# Patient Record
Sex: Female | Born: 1950 | Race: Black or African American | Hispanic: No | Marital: Single | Smoking: Never smoker
Health system: Southern US, Community
[De-identification: ages and names within clinical notes are randomized; demographics above are authoritative.]

## PROBLEM LIST (undated history)

## (undated) DIAGNOSIS — E785 Hyperlipidemia, unspecified: Secondary | ICD-10-CM

## (undated) DIAGNOSIS — I10 Essential (primary) hypertension: Secondary | ICD-10-CM

## (undated) DIAGNOSIS — E119 Type 2 diabetes mellitus without complications: Secondary | ICD-10-CM

## (undated) DIAGNOSIS — C801 Malignant (primary) neoplasm, unspecified: Secondary | ICD-10-CM

## (undated) DIAGNOSIS — D649 Anemia, unspecified: Secondary | ICD-10-CM

## (undated) DIAGNOSIS — R0602 Shortness of breath: Secondary | ICD-10-CM

## (undated) DIAGNOSIS — F419 Anxiety disorder, unspecified: Secondary | ICD-10-CM

## (undated) DIAGNOSIS — C189 Malignant neoplasm of colon, unspecified: Secondary | ICD-10-CM

## (undated) DIAGNOSIS — K219 Gastro-esophageal reflux disease without esophagitis: Secondary | ICD-10-CM

## (undated) HISTORY — DX: Essential (primary) hypertension: I10

## (undated) HISTORY — DX: Anemia, unspecified: D64.9

## (undated) HISTORY — DX: Malignant neoplasm of colon, unspecified: C18.9

## (undated) HISTORY — PX: CHOLECYSTECTOMY: SHX55

## (undated) HISTORY — PX: ABDOMINAL SURGERY: SHX537

## (undated) HISTORY — PX: HYSTERECTOMY: SHX81

---

## 1985-09-04 HISTORY — PX: CHOLECYSTECTOMY: SHX55

## 2003-09-05 DIAGNOSIS — C189 Malignant neoplasm of colon, unspecified: Secondary | ICD-10-CM

## 2003-09-05 HISTORY — PX: COLECTOMY: SHX59

## 2003-09-05 HISTORY — DX: Malignant neoplasm of colon, unspecified: C18.9

## 2010-11-17 ENCOUNTER — Emergency Department (HOSPITAL_COMMUNITY): Payer: Medicaid Other

## 2010-11-17 ENCOUNTER — Emergency Department (HOSPITAL_COMMUNITY)
Admission: EM | Admit: 2010-11-17 | Discharge: 2010-11-17 | Disposition: A | Discharge status: Home / Self Care | Payer: Medicaid Other | Attending: Emergency Medicine | Admitting: Emergency Medicine

## 2010-11-17 DIAGNOSIS — R109 Unspecified abdominal pain: Secondary | ICD-10-CM | POA: Insufficient documentation

## 2010-11-17 DIAGNOSIS — N839 Noninflammatory disorder of ovary, fallopian tube and broad ligament, unspecified: Secondary | ICD-10-CM | POA: Insufficient documentation

## 2010-11-17 LAB — URINALYSIS, ROUTINE W REFLEX MICROSCOPIC
Glucose, UA: NEGATIVE mg/dL
Leukocytes, UA: NEGATIVE
Protein, ur: NEGATIVE mg/dL
Specific Gravity, Urine: >1.03 — ABNORMAL HIGH (ref 1.005–1.030)
Urobilinogen, UA: 0.2 mg/dL (ref 0.0–1.0)

## 2010-11-17 LAB — DIFFERENTIAL
Basophils Absolute: 0 10*3/uL (ref 0.0–0.1)
Basophils Relative: 0 % (ref 0–1)
Eosinophils Absolute: 0 10*3/uL (ref 0.0–0.7)
Eosinophils Relative: 1 % (ref 0–5)
Monocytes Absolute: 0.7 10*3/uL (ref 0.1–1.0)

## 2010-11-17 LAB — CBC
MCHC: 33 g/dL (ref 30.0–36.0)
Platelets: 206 10*3/uL (ref 150–400)
RDW: 12.8 % (ref 11.5–15.5)
WBC: 4.7 10*3/uL (ref 4.0–10.5)

## 2010-11-17 LAB — URINE MICROSCOPIC-ADD ON

## 2010-11-17 LAB — COMPREHENSIVE METABOLIC PANEL
ALT: 16 U/L (ref 0–35)
Albumin: 3.4 g/dL — ABNORMAL LOW (ref 3.5–5.2)
Alkaline Phosphatase: 49 U/L (ref 39–117)
Calcium: 9.1 mg/dL (ref 8.4–10.5)
Potassium: 3.4 mEq/L — ABNORMAL LOW (ref 3.5–5.1)
Sodium: 138 mEq/L (ref 135–145)
Total Protein: 6.7 g/dL (ref 6.0–8.3)

## 2010-11-17 LAB — PROTIME-INR
INR: 1.01 (ref 0.00–1.49)
Prothrombin Time: 13.5 seconds (ref 11.6–15.2)

## 2010-11-17 MED ORDER — IOHEXOL 300 MG/ML  SOLN
100.0000 mL | Freq: Once | INTRAMUSCULAR | Status: AC | PRN
  Administered 2010-11-17: 100 mL via INTRAVENOUS

## 2010-11-18 ENCOUNTER — Emergency Department (HOSPITAL_COMMUNITY): Payer: Medicaid Other

## 2010-11-18 LAB — CEA: CEA: 11.4 ng/mL — ABNORMAL HIGH (ref 0.0–5.0)

## 2010-11-18 LAB — URINE CULTURE
Culture  Setup Time: 201203160143
Culture: NO GROWTH

## 2010-11-21 ENCOUNTER — Other Ambulatory Visit: Payer: Self-pay | Admitting: Obstetrics and Gynecology

## 2010-11-21 ENCOUNTER — Other Ambulatory Visit (HOSPITAL_COMMUNITY)
Admission: RE | Admit: 2010-11-21 | Discharge: 2010-11-21 | Disposition: A | Discharge status: Home / Self Care | Payer: Medicaid Other | Source: Ambulatory Visit | Attending: Obstetrics and Gynecology | Admitting: Obstetrics and Gynecology

## 2010-11-21 DIAGNOSIS — Z01419 Encounter for gynecological examination (general) (routine) without abnormal findings: Secondary | ICD-10-CM | POA: Insufficient documentation

## 2010-12-01 ENCOUNTER — Ambulatory Visit: Payer: Medicaid Other | Attending: Gynecologic Oncology | Admitting: Gynecologic Oncology

## 2010-12-01 ENCOUNTER — Other Ambulatory Visit: Payer: Self-pay | Admitting: Gynecologic Oncology

## 2010-12-01 DIAGNOSIS — Z9089 Acquired absence of other organs: Secondary | ICD-10-CM | POA: Insufficient documentation

## 2010-12-01 DIAGNOSIS — R6881 Early satiety: Secondary | ICD-10-CM | POA: Insufficient documentation

## 2010-12-01 DIAGNOSIS — N9489 Other specified conditions associated with female genital organs and menstrual cycle: Secondary | ICD-10-CM | POA: Insufficient documentation

## 2010-12-01 DIAGNOSIS — R110 Nausea: Secondary | ICD-10-CM | POA: Insufficient documentation

## 2010-12-01 DIAGNOSIS — E785 Hyperlipidemia, unspecified: Secondary | ICD-10-CM | POA: Insufficient documentation

## 2010-12-01 DIAGNOSIS — Z85038 Personal history of other malignant neoplasm of large intestine: Secondary | ICD-10-CM | POA: Insufficient documentation

## 2010-12-01 DIAGNOSIS — I10 Essential (primary) hypertension: Secondary | ICD-10-CM | POA: Insufficient documentation

## 2010-12-01 DIAGNOSIS — E119 Type 2 diabetes mellitus without complications: Secondary | ICD-10-CM | POA: Insufficient documentation

## 2010-12-02 ENCOUNTER — Other Ambulatory Visit: Payer: Self-pay | Admitting: Gynecology

## 2010-12-02 ENCOUNTER — Encounter (HOSPITAL_COMMUNITY): Payer: Medicaid Other | Attending: Gynecology

## 2010-12-02 DIAGNOSIS — Z01812 Encounter for preprocedural laboratory examination: Secondary | ICD-10-CM | POA: Insufficient documentation

## 2010-12-02 LAB — CBC
Platelets: 310 10*3/uL (ref 150–400)
RBC: 4.36 MIL/uL (ref 3.87–5.11)
RDW: 13.1 % (ref 11.5–15.5)
WBC: 4.9 10*3/uL (ref 4.0–10.5)

## 2010-12-02 LAB — COMPREHENSIVE METABOLIC PANEL
ALT: 16 U/L (ref 0–35)
Albumin: 3.7 g/dL (ref 3.5–5.2)
Calcium: 9.2 mg/dL (ref 8.4–10.5)
Glucose, Bld: 143 mg/dL — ABNORMAL HIGH (ref 70–99)
Sodium: 137 mEq/L (ref 135–145)
Total Protein: 7.1 g/dL (ref 6.0–8.3)

## 2010-12-02 LAB — DIFFERENTIAL
Basophils Absolute: 0 10*3/uL (ref 0.0–0.1)
Basophils Absolute: 0 10*3/uL (ref 0.0–0.1)
Basophils Relative: 0 % (ref 0–1)
Eosinophils Absolute: 0.1 10*3/uL (ref 0.0–0.7)
Eosinophils Relative: 2 % (ref 0–5)
Eosinophils Relative: 2 % (ref 0–5)
Lymphocytes Relative: 30 % (ref 12–46)
Lymphs Abs: 1.5 10*3/uL (ref 0.7–4.0)
Neutro Abs: 3 10*3/uL (ref 1.7–7.7)
Neutrophils Relative %: 61 % (ref 43–77)
Neutrophils Relative %: 61 % (ref 43–77)

## 2010-12-02 LAB — SURGICAL PCR SCREEN
MRSA, PCR: NEGATIVE
Staphylococcus aureus: NEGATIVE

## 2010-12-02 LAB — ABO/RH: ABO/RH(D): A POS

## 2010-12-02 NOTE — Consult Note (Signed)
NAME:  Yolanda Friedman, Yolanda Friedman NO.:  [id number]  MEDICAL RECORD NO.:  [id number]           PATIENT TYPE:  LOCATION:                                 FACILITY:  PHYSICIAN:  Laurette Schimke, MD     DATE OF BIRTH:  1951-04-09  DATE OF CONSULTATION: DATE OF DISCHARGE:                                CONSULTATION   REASON FOR VISIT:  Visit was requested for evaluation of a pelvic mass.  HISTORY OF PRESENT ILLNESS:  This is a 60 year old gravida 1, para 1, last normal menstrual period at approximately 60 years of age.  Yolanda Friedman has noticed an increase in abdominal girth of 2 months' duration. At this time, she is currently unable to fit into many of her pants. She reports some nausea, no vomiting.  There is early satiety, although she states that she has a good appetite.  There is no vaginal or rectal bleeding or lower extremity edema.  She denies any weight loss.  She reports normal caliber of stool.  No rectal bleeding.  Her history is notable for colon cancer, stage unknown to the patient, diagnosed in May 2005, treated with surgical resection and chemotherapy.  She states that in 2010 prior to her departure from [place], [place].  She underwent a colonoscopy, which is within normal limits.  She is unclear of her CEA at the time of presentation.  The colon cancer was identified as part of a workup for anemia and shortness of breath.  She was evaluated by Dr. Emelda Fear and a CT of the abdomen and pelvis demonstrates the presence of a 24.1 x 16.2 x 17.4 solid right adnexal mass and left adnexal mass measuring 15.6 x 14.0 x 13.7 cm was noted.  Endometrium was noted to be thickened, measuring 2.2 cm.  Uterus was enlarged to 12.4 cm.  An ultrasound confirmed these findings.  A CEA was obtained, returned with a value of 11.4.  CA-125 returned a value of 58.2.  PAST MEDICAL HISTORY: 1. Hypertension for 4 years. 2. Diabetes mellitus, diet controlled, diagnosed 4 years  ago. 3. Hyperlipidemia, diagnosed 4 years ago. 4. Colon cancer, diagnosed in 2005.  PAST SURGICAL HISTORY:  Colon resection in 2005, cholecystectomy greater than 20 years ago.  PAST GYN HISTORY:  Menarche occurred at age of 81 with regular menses. Last Pap in 2012 within normal limits per the patient.  FAMILY HISTORY:  Without any reports of uterine, ovarian, breasts, colon, kidney malignancies.  SOCIAL HISTORY:  She denies tobacco use.  She reports use of approximately 24 ounces of beer per day and occasional marijuana use. She moved from [place], [place] in 2010.  She is unemployed.  Her son and adopted son are very present and involved in her life.  REVIEW OF SYSTEMS:  Ten-point review of systems is only noteworthy as above.  PHYSICAL EXAMINATION:  GENERAL:  A well-developed pleasant female in no acute distress. VITAL SIGNS:  Height 5 feet, weight 210 pounds, BMI 41.  Blood pressure 140/80, pulse of 80, respiratory rate of 20.  CHEST:  Clear to auscultation. HEART:  Regular rate and rhythm. ABDOMEN:  Distended, mass palpable above the umbilicus.  No CVA tenderness. LYMPH NODE SURVEY:  No cervical, supraclavicular, or inguinal adenopathy. PELVIC:  Normal external genitalia, Bartholin, urethral, and Skene. Cervix approximately 2 cm.  Endometrial biopsy performed with return of bloody fluid.  Uterus sounded to approximately 11 cm.  No nodularity within the cul-de-sac.  Pelvic masses appreciated. RECTAL:  Good anal and sphincter tone without any masses.  No palpable staple line.  IMPRESSION:  This is a lovely 60 year old with large bilateral adnexal solid masses and elevated CEA and CA-125 and a history of colon cancer. The patient and her sons were counseled that this may represent an ovarian neoplasm versus possible metastatic colon cancer.  An endometrial biopsy was performed to further elucidate the cause of the thickened endometrial stripe.  The pathology will be  reviewed prior to surgical intervention.  The proposed surgical intervention as recommended and approved by the patient was that of exploratory laparotomy, total abdominal hysterectomy, bilateral salpingo-oophorectomy.  Staging or additional procedures will be performed as directed by the intraoperative frozen section.  Yolanda Friedman is aware that her surgeon will be Dr. Katheren Shams- Sharol Given.  Risks and benefits of the procedure were discussed with her. All of her and her son's questions were answered to their satisfaction.     Laurette Schimke, MD     WB/MEDQ  D:  12/01/2010  T:  12/02/2010  Job:  [id number]  cc:   Dr. Clearence Cheek, R.N. [address] [place], [place] [address]  Electronically Signed by Laurette Schimke MD on 12/02/2010 10:09:34 AM

## 2010-12-06 ENCOUNTER — Other Ambulatory Visit: Payer: Self-pay | Admitting: Gynecology

## 2010-12-06 ENCOUNTER — Inpatient Hospital Stay (HOSPITAL_COMMUNITY)
Admission: RE | Admit: 2010-12-06 | Discharge: 2010-12-12 | DRG: 737 | Disposition: A | Discharge status: Home / Self Care | Payer: Medicaid Other | Source: Ambulatory Visit | Attending: Obstetrics & Gynecology | Admitting: Obstetrics & Gynecology

## 2010-12-06 DIAGNOSIS — E876 Hypokalemia: Secondary | ICD-10-CM | POA: Diagnosis not present

## 2010-12-06 DIAGNOSIS — D259 Leiomyoma of uterus, unspecified: Secondary | ICD-10-CM | POA: Diagnosis present

## 2010-12-06 DIAGNOSIS — E119 Type 2 diabetes mellitus without complications: Secondary | ICD-10-CM | POA: Diagnosis present

## 2010-12-06 DIAGNOSIS — Z6841 Body Mass Index (BMI) 40.0 and over, adult: Secondary | ICD-10-CM

## 2010-12-06 DIAGNOSIS — C786 Secondary malignant neoplasm of retroperitoneum and peritoneum: Secondary | ICD-10-CM | POA: Diagnosis present

## 2010-12-06 DIAGNOSIS — C189 Malignant neoplasm of colon, unspecified: Secondary | ICD-10-CM | POA: Diagnosis present

## 2010-12-06 DIAGNOSIS — Z01812 Encounter for preprocedural laboratory examination: Secondary | ICD-10-CM

## 2010-12-06 DIAGNOSIS — I10 Essential (primary) hypertension: Secondary | ICD-10-CM | POA: Diagnosis present

## 2010-12-06 DIAGNOSIS — N2889 Other specified disorders of kidney and ureter: Secondary | ICD-10-CM | POA: Diagnosis present

## 2010-12-06 DIAGNOSIS — K560 Paralytic ileus: Secondary | ICD-10-CM | POA: Diagnosis not present

## 2010-12-06 DIAGNOSIS — C7960 Secondary malignant neoplasm of unspecified ovary: Principal | ICD-10-CM | POA: Diagnosis present

## 2010-12-06 LAB — GLUCOSE, CAPILLARY
Glucose-Capillary: 113 mg/dL — ABNORMAL HIGH (ref 70–99)
Glucose-Capillary: 193 mg/dL — ABNORMAL HIGH (ref 70–99)

## 2010-12-06 LAB — HEMOGLOBIN AND HEMATOCRIT, BLOOD: HCT: 38.7 % (ref 36.0–46.0)

## 2010-12-07 LAB — CBC
MCH: 28.2 pg (ref 26.0–34.0)
MCHC: 32.1 g/dL (ref 30.0–36.0)
Platelets: 216 10*3/uL (ref 150–400)
RDW: 14.2 % (ref 11.5–15.5)

## 2010-12-07 LAB — GLUCOSE, CAPILLARY
Glucose-Capillary: 205 mg/dL — ABNORMAL HIGH (ref 70–99)
Glucose-Capillary: 144 mg/dL — ABNORMAL HIGH (ref 70–99)
Glucose-Capillary: 147 mg/dL — ABNORMAL HIGH (ref 70–99)

## 2010-12-07 LAB — BASIC METABOLIC PANEL
CO2: 25 mEq/L (ref 19–32)
Calcium: 7.6 mg/dL — ABNORMAL LOW (ref 8.4–10.5)
Creatinine, Ser: 0.52 mg/dL (ref 0.4–1.2)
GFR calc Af Amer: >60 mL/min (ref >60)
GFR calc non Af Amer: >60 mL/min (ref >60)
Sodium: 134 mEq/L — ABNORMAL LOW (ref 135–145)

## 2010-12-07 LAB — HEMOGLOBIN A1C: Mean Plasma Glucose: 137 mg/dL — ABNORMAL HIGH (ref <117)

## 2010-12-07 NOTE — Op Note (Signed)
NAME:  Yolanda Friedman, Yolanda Friedman NO.:  [id number]  MEDICAL RECORD NO.:  [id number]           PATIENT TYPE:  I  LOCATION:  1532                         FACILITY:  [facility]  PHYSICIAN:  De Blanch, M.D.DATE OF BIRTH:  12-Jun-1951  DATE OF PROCEDURE:  12/06/2010 DATE OF DISCHARGE:                              OPERATIVE REPORT   PREOPERATIVE DIAGNOSES:  Bilateral complex adnexal masses, retroperitoneal fibrosis.  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  PROCEDURE:  Exploratory laparotomy, lysis of adhesions, radical debulking of ovarian cancer including bilateral salpingo-oophorectomy, hysterectomy, omentectomy, ureterolysis.  SURGEON:  De Blanch, MD  ASSISTANT:  Roseanna Rainbow, MD; Telford Nab, RN  ANESTHESIA:  General with orotracheal tube.  ESTIMATED BLOOD LOSS:  1700 cc.  The patient was transfused 2 units of packed red blood cells.  SURGICAL FINDINGS:  At time of exploratory laparotomy, the patient had dense adhesions to the anterior abdominal wall in the previous midline incision.  There were multicystic bilateral ovarian masses, one measuring approximately 25 cm in diameter, the other approximately 16 cm in diameter.  These were densely adherent to the sigmoid mesentery on the left and to the mesentery of the small bowel on the right.  There was considerable amount of retroperitoneal fibrosis bilaterally, necessitating ureterolysis.  The diaphragms were free of any disease. There were multiple implants in the omentum, none measuring more than 2 cm in diameter.  At the completion of the surgical procedure, the only gross residual disease were some small nodules in the posterior cul-de- sac.  PROCEDURE IN DETAIL:  The patient was brought to the operating room and after satisfactory attainment of general anesthesia, she was placed in modified lithotomy position in  [place] stirrups.  The anterior abdominal wall, perineum, and vagina were prepped.  Foley catheter was inserted and the patient was draped.  The abdomen was entered through a previous midline incision which ultimately extended nearly to the xiphoid.  There was a small amount of ascites which was aspirated and sent to Cytopathology.  Lysis of adhesions from the small bowel to the anterior abdominal wall was accomplished with sharp dissection.  A Bookwalter retractor was positioned and we approached the left pelvic mass.  An incision in the peritoneum lateral to the mass along the paracolic gutter was created including dividing the round ligament.  The retroperitoneum was densely fibrotic, but we gradually mobilized beneath the mass overlying the psoas muscle.  We further dissected with sharp dissection the cystic structure off the mesentery of the sigmoid colon. In the course of doing this, a small defect was created in the mesentery which was ultimately closed with interrupted 2-0 Vicryl sutures.  The ovarian vessels were identified, clamped, cut, and free tied.  The ureter was identified and ureterolysis was performed from the mid abdomen down into the pelvis at the same time mobilizing the large left ovarian tumor from left towards the right.  Further dissection along the mesentery of the colon, ultimately freed the tumor.  The uterus was enlarged with fibroids.  The cornea was doubly clamped and divided and the remainder of  the broad ligament incised along with the bladder flap, thus fairly mobilizing the left ovarian tumor and fallopian tube until we were able to entirely excise it and this was submitted to Pathology. Similar procedure was performed on the right side.  The retroperitoneal spaces opened, the vessels and ureter were identified.  The ovarian vessels were skeletonized, clamped, cut, and doubly tied.  There were dense adhesions of the cystic mass to the mesentery of the  small intestines.  These were lysed sharply and with cautery, nonetheless considerable amount of oozing occurred resulting in the above-noted blood loss.  We further mobilize the right ovarian tumor down into the pelvis, all the way being vigilant of the ureter until we were able to cross clamp the uterine cornu and transect the ovarian tumor, submitting the right tube and ovary for permanent section.  We then proceeded with hysterectomy and the fibroid uterus.  There were 2 paracervical fibroids and large fundal fibroid, 1 very calcified fibroid, measuring approximately 5 cm in diameter.  The bladder flap was advanced with sharp dissection.  The uterine vessels were skeletonized beneath the paracervical fibroid and then clamped, cut, and suture ligated.  Some of the hemostatic control was accomplished using the EnSeal electrosurgical device.  Because of some small amount of residual tumor in the posterior cul-de-sac, I elected to perform a supracervical hysterectomy.  The cervix was transected from its junction with the uterus.  One additional uterine vein was identified, clamped, and suture ligated.  Ultimately, the cervical stump was sutured to the posterior cul-de-sac and closing the rectovaginal septum reduced continued oozing in this area.  Attention was turned to the upper abdomen.  The bleeding area along the mesentery of the small bowel was treated with FloSeal and Gelfoam was applied.  We kept a pack on this area and directed our attention to the omentum.  The omentum was detached from the greater curvature of the stomach and the transverse colon using the EnSeal device, thus removing all gross disease in the omentum. An NG tube was confirmed to reside properly in the stomach.  We then undertook a search for any additional bleeding, both paracolic gutters were explored, hemostasis achieved with cautery.  Attention was turned to the pelvis and some additional bleeding points  were likewise cauterized.  We then turned again to the mesentery of the small bowel which still had some bleeding present, additional application of FloSeal and another sheet of Gelfoam was placed in this area and pressure was applied.  At this juncture, it appeared that the bleeding was controlled.  The pelvis was irrigated and found to be hemostatic.  Packs and retractors were removed.  The anterior abdominal wall was closed in layers the first being a running mass closure using #1 PDS. Subcutaneous tissue was irrigated.  A Blake drain was placed above the fascia and exited through a stab wound in the left upper quadrant.  The drain was sutured to the skin with 3-0 nylon.  Subcutaneous tissue was reapproximated with interrupted sutures of 3-0 Vicryl and then the skin closed with skin staples.  A dressing was applied.  The patient was awakened from anesthesia, taken to the recovery room in satisfactory condition.  Sponge, needle, and instrument counts correct x2.     De Blanch, M.D.     DC/MEDQ  D:  12/06/2010  T:  12/07/2010  Job:  [id number]  cc:   Roseanna Rainbow, M.D. Fax: [phone]  Telford Nab, R.N. [address] [place],  [place] [address]  Tilda Burrow, M.D. Fax: [phone]  Electronically Signed by De Blanch M.D. on 12/07/2010 09:23:07 AM

## 2010-12-08 LAB — BASIC METABOLIC PANEL
BUN: 2 mg/dL — ABNORMAL LOW (ref 6–23)
CO2: 26 mEq/L (ref 19–32)
Glucose, Bld: 127 mg/dL — ABNORMAL HIGH (ref 70–99)
Potassium: 3.6 mEq/L (ref 3.5–5.1)
Sodium: 135 mEq/L (ref 135–145)

## 2010-12-08 LAB — GLUCOSE, CAPILLARY
Glucose-Capillary: 111 mg/dL — ABNORMAL HIGH (ref 70–99)
Glucose-Capillary: 124 mg/dL — ABNORMAL HIGH (ref 70–99)
Glucose-Capillary: 132 mg/dL — ABNORMAL HIGH (ref 70–99)
Glucose-Capillary: 141 mg/dL — ABNORMAL HIGH (ref 70–99)
Glucose-Capillary: 148 mg/dL — ABNORMAL HIGH (ref 70–99)

## 2010-12-09 ENCOUNTER — Inpatient Hospital Stay (HOSPITAL_COMMUNITY): Payer: Medicaid Other

## 2010-12-09 LAB — BASIC METABOLIC PANEL
BUN: 1 mg/dL — ABNORMAL LOW (ref 6–23)
CO2: 28 mEq/L (ref 19–32)
Calcium: 7.6 mg/dL — ABNORMAL LOW (ref 8.4–10.5)
Creatinine, Ser: 0.49 mg/dL (ref 0.4–1.2)
GFR calc non Af Amer: >60 mL/min (ref >60)
Glucose, Bld: 131 mg/dL — ABNORMAL HIGH (ref 70–99)

## 2010-12-09 LAB — TYPE AND SCREEN
Unit division: 0
Unit division: 0

## 2010-12-09 LAB — GLUCOSE, CAPILLARY
Glucose-Capillary: 115 mg/dL — ABNORMAL HIGH (ref 70–99)
Glucose-Capillary: 119 mg/dL — ABNORMAL HIGH (ref 70–99)
Glucose-Capillary: 99 mg/dL (ref 70–99)

## 2010-12-10 LAB — GLUCOSE, CAPILLARY
Glucose-Capillary: 113 mg/dL — ABNORMAL HIGH (ref 70–99)
Glucose-Capillary: 88 mg/dL (ref 70–99)
Glucose-Capillary: 98 mg/dL (ref 70–99)
Glucose-Capillary: 84 mg/dL (ref 70–99)

## 2010-12-10 LAB — BASIC METABOLIC PANEL
GFR calc Af Amer: >60 mL/min (ref >60)
GFR calc non Af Amer: >60 mL/min (ref >60)
Potassium: 3.7 mEq/L (ref 3.5–5.1)
Sodium: 137 mEq/L (ref 135–145)

## 2010-12-11 LAB — BASIC METABOLIC PANEL
CO2: 27 mEq/L (ref 19–32)
Calcium: 8.4 mg/dL (ref 8.4–10.5)
Chloride: 105 mEq/L (ref 96–112)
GFR calc Af Amer: >60 mL/min (ref >60)
Sodium: 137 mEq/L (ref 135–145)

## 2010-12-11 LAB — GLUCOSE, CAPILLARY
Glucose-Capillary: 92 mg/dL (ref 70–99)
Glucose-Capillary: 101 mg/dL — ABNORMAL HIGH (ref 70–99)
Glucose-Capillary: 96 mg/dL (ref 70–99)

## 2010-12-12 LAB — BASIC METABOLIC PANEL
BUN: 2 mg/dL — ABNORMAL LOW (ref 6–23)
Chloride: 105 mEq/L (ref 96–112)
Creatinine, Ser: 0.45 mg/dL (ref 0.4–1.2)
Glucose, Bld: 76 mg/dL (ref 70–99)

## 2010-12-12 LAB — GLUCOSE, CAPILLARY: Glucose-Capillary: 87 mg/dL (ref 70–99)

## 2010-12-20 HISTORY — PX: ABDOMINAL HYSTERECTOMY: SHX81

## 2010-12-22 NOTE — Discharge Summary (Signed)
NAME:  Yolanda Friedman, FRECH NO.:  [id number]  MEDICAL RECORD NO.:  [id number]           PATIENT TYPE:  I  LOCATION:  1532                         FACILITY:  [facility]  PHYSICIAN:  Charles A. Clearance Coots, M.D.DATE OF BIRTH:  1951/03/24  DATE OF ADMISSION:  12/06/2010 DATE OF DISCHARGE:  12/12/2010                              DISCHARGE SUMMARY   ADMITTING DIAGNOSIS:  Large abdominal pelvic mass.  Rule out ovarian cancer or recurrent colon cancer.  DISCHARGE DIAGNOSIS:  Large abdominal pelvic mass.  Metastatic ovarian cancer, stage IIIB status post supracervical hysterectomy, bilateral salpingo-oophorectomy, omentectomy, radical debulking and ureterolysis.  DISPOSITION:  Discharged home in good condition.  REASON FOR ADMISSION:  This 60-year gravida 1, para 1, last normal menstrual period approximately 60 years of age noticed increase in abdominal girth of approximately 2 months' duration.  At this time she is unable to fit into many of her pants.  She reports some nausea and no vomiting.  There is early satiety, although she states that she has a good appetite.  There was no vaginal or rectal bleeding or lower extremity edema.  She denies any weight loss.  She reports normal caliber of stool.  No rectal bleeding.  Her history is notable for colon cancer, the stage is unknown to the patient, diagnosed in May 2005 treated with surgical resection and chemotherapy.  She states that in 2010 prior to her departure from [place], [place], she underwent a colonoscopy which was within normal limits.  She is unclear of her CEA at the time of presentation.  Colon cancer was identified as part of a workup for anemia and shortness of breath.  She was evaluated by Dr. Emelda Fear and a CT of the abdomen and pelvis demonstrates the presence of a 24 x 16 x 17 cm solid right adnexal mass and a left adnexal mass measuring 15 x 14 x 13 cm.  The endometrium was noted to be thickened measuring  2.2 cm.  Uterus is enlarged to 12.4 cm.  Ultrasound confirmed these findings.  A CEA was obtained and returned with a value of 11.4. A CA-125 returned with a value of 58.2.  PAST SURGICAL HISTORY:  Colon resection in 2005 and cholecystectomy greater than 20 years ago.  ILLNESSES:  Hypertension, diabetes, hyperlipidemia, colon cancer.  PAST GYNECOLOGIC HISTORY:  Last Pap smear in 2012 within normal limits per the patient.  PAST OBSTETRICAL HISTORY:  Gravida 1, para 1.  FAMILY HISTORY:  Without any reports of uterine, ovarian, breast, colon, or kidney malignancies.  SOCIAL HISTORY:  Denies tobacco use.  Reports use of approximately 24 ounces of beer per day and occasional marijuana use.  She moved from [place], [place] in 2010.  She is unemployed.  Her son and adopted son are very present and involved in her life.  REVIEW OF SYSTEMS:  A 10-point review of systems is only noteworthy as above.  PHYSICAL EXAMINATION:  GENERAL:  A well-nourished, well-developed female in no acute distress.  Height is 5 feet.  Weight is 210 pounds.  BMI is 41. VITAL SIGNS:  Blood pressure 140/80, pulse 80, respiratory rate 20. LUNGS:  Clear to auscultation bilaterally. HEART:  Regular rate and rhythm. ABDOMEN:  Distended mass palpable above the umbilicus.  No CVA tenderness. LYMPH NODE SURVEY:  No cervical, supraclavicular or inguinal adenopathy. PELVIC EXAMINATION:  Normal external female genitalia.  Bartholin's, urethra and Skene's are normal.  Cervix approximately 2 cm.  Endometrial biopsy performed with return of bloody fluid.  Uterus sounded to approximately 11 cm.  No nodularity within the cul-de-sac.  Pelvic mass is appreciated. RECTAL EXAMINATION:  Good anal and sphincter tone without any masses. No palpable staple line.  IMPRESSION:  A 60 year old with large bilateral adnexal solid masses and elevated CEA and CA-125 and a history of colon cancer.  The patient and her sons were  counseled that this may represent an ovarian neoplasm versus possible metastatic colon cancer.  An endometrial biopsy was performed to further elucidate the cause of the thickened endometrial stripe.  The pathology will be reviewed prior to surgical intervention.  PLAN:  The proposed surgical intervention that was recommended and approved by the patient was that of exploratory laparotomy, total abdominal hysterectomy, bilateral salpingo-oophorectomy.  Staging or additional procedures will be performed as directed by the intraoperative frozen sections.  All of her and her son's questions were answered to their satisfaction.  ADMITTING LABORATORIES:  Hemoglobin 12.3, hematocrit 38.7.  Sodium 137, potassium 3.8, BUN 5, creatinine 0.55.  SGOT 25, SGPT 16.  RADIOLOGIC: 1. Chest x-ray:  No active pulmonary disease. 2. CT and ultrasound of the abdomen:  Per history of present illness. 3. EKG:  Normal sinus rhythm.  Septal infarct, age undetermined as     read by machine.  HOSPITAL COURSE:  The patient underwent exploratory laparotomy, supracervical hysterectomy, bilateral salpingo-oophorectomy, omentectomy, radical debulking and ureteral lysis on December 06, 2010. There were no intraoperative complications.  Postoperative course was complicated by a postoperative adynamic ileus with good return to bowel function by postoperative day #5 and the patient was discharged home on postoperative day #6 with normal bowel activity and in good condition.  DISCHARGE LABORATORIES:  Hemoglobin 11, white blood cell count 12,500, platelets 216,000.  Sodium 134, potassium 4, BUN 4, creatinine 0.52.  DISCHARGE MEDICATIONS:  Continue medications that were taken prior to coming to the hospital.  Vicodin Extra Strength was prescribed for pain along with ibuprofen.  DISCHARGE INSTRUCTIONS:  Routine instructions were given for discharge after hysterectomy.  FOLLOWUP:  The patient is to follow up at the  [facility] with Dr. Serita Kyle in one week for removal of staples and further consultation regarding postoperative plans.     Charles A. Clearance Coots, M.D.     CAH/MEDQ  D:  12/12/2010  T:  12/12/2010  Job:  [id number]  cc:   Roseanna Rainbow, M.D. Fax: [phone]  De Blanch, M.D.  Tilda Burrow, M.D. Fax: [phone]  Electronically Signed by Coral Ceo M.D. on 12/22/2010 02:30:27 PM

## 2011-01-03 HISTORY — PX: PORTACATH PLACEMENT: SHX2246

## 2011-01-04 ENCOUNTER — Encounter (HOSPITAL_COMMUNITY): Payer: Medicaid Other | Attending: Oncology | Admitting: Oncology

## 2011-01-04 ENCOUNTER — Other Ambulatory Visit (HOSPITAL_COMMUNITY): Payer: Self-pay | Admitting: Oncology

## 2011-01-04 DIAGNOSIS — Z85038 Personal history of other malignant neoplasm of large intestine: Secondary | ICD-10-CM | POA: Insufficient documentation

## 2011-01-04 DIAGNOSIS — C569 Malignant neoplasm of unspecified ovary: Secondary | ICD-10-CM

## 2011-01-04 DIAGNOSIS — C786 Secondary malignant neoplasm of retroperitoneum and peritoneum: Secondary | ICD-10-CM | POA: Insufficient documentation

## 2011-01-04 DIAGNOSIS — C189 Malignant neoplasm of colon, unspecified: Secondary | ICD-10-CM

## 2011-01-04 DIAGNOSIS — C7960 Secondary malignant neoplasm of unspecified ovary: Secondary | ICD-10-CM | POA: Insufficient documentation

## 2011-01-04 DIAGNOSIS — C801 Malignant (primary) neoplasm, unspecified: Secondary | ICD-10-CM

## 2011-01-05 ENCOUNTER — Other Ambulatory Visit (HOSPITAL_COMMUNITY): Payer: Self-pay | Admitting: Oncology

## 2011-01-05 DIAGNOSIS — C189 Malignant neoplasm of colon, unspecified: Secondary | ICD-10-CM

## 2011-01-11 ENCOUNTER — Encounter (HOSPITAL_COMMUNITY): Payer: Medicaid Other

## 2011-01-11 DIAGNOSIS — C785 Secondary malignant neoplasm of large intestine and rectum: Secondary | ICD-10-CM

## 2011-01-12 ENCOUNTER — Encounter (HOSPITAL_COMMUNITY): Payer: Self-pay

## 2011-01-12 ENCOUNTER — Encounter (HOSPITAL_COMMUNITY)
Admission: RE | Admit: 2011-01-12 | Discharge: 2011-01-12 | Disposition: A | Discharge status: Home / Self Care | Payer: Medicaid Other | Source: Ambulatory Visit | Attending: Oncology | Admitting: Oncology

## 2011-01-12 DIAGNOSIS — Z9079 Acquired absence of other genital organ(s): Secondary | ICD-10-CM | POA: Insufficient documentation

## 2011-01-12 DIAGNOSIS — N83209 Unspecified ovarian cyst, unspecified side: Secondary | ICD-10-CM | POA: Insufficient documentation

## 2011-01-12 DIAGNOSIS — C189 Malignant neoplasm of colon, unspecified: Secondary | ICD-10-CM

## 2011-01-12 DIAGNOSIS — Z90710 Acquired absence of both cervix and uterus: Secondary | ICD-10-CM | POA: Insufficient documentation

## 2011-01-12 DIAGNOSIS — C569 Malignant neoplasm of unspecified ovary: Secondary | ICD-10-CM

## 2011-01-12 DIAGNOSIS — I2510 Atherosclerotic heart disease of native coronary artery without angina pectoris: Secondary | ICD-10-CM | POA: Insufficient documentation

## 2011-01-12 DIAGNOSIS — J984 Other disorders of lung: Secondary | ICD-10-CM | POA: Insufficient documentation

## 2011-01-12 DIAGNOSIS — I7090 Unspecified atherosclerosis: Secondary | ICD-10-CM | POA: Insufficient documentation

## 2011-01-12 HISTORY — DX: Malignant (primary) neoplasm, unspecified: C80.1

## 2011-01-12 MED ORDER — FLUDEOXYGLUCOSE F - 18 (FDG) INJECTION
17.7000 | Freq: Once | INTRAVENOUS | Status: AC | PRN
  Administered 2011-01-12: 17.7 via INTRAVENOUS

## 2011-01-16 ENCOUNTER — Ambulatory Visit (HOSPITAL_COMMUNITY)
Admission: RE | Admit: 2011-01-16 | Discharge: 2011-01-16 | Disposition: A | Discharge status: Home / Self Care | Payer: Medicaid Other | Source: Ambulatory Visit | Attending: Oncology | Admitting: Oncology

## 2011-01-16 DIAGNOSIS — C189 Malignant neoplasm of colon, unspecified: Secondary | ICD-10-CM

## 2011-01-17 ENCOUNTER — Ambulatory Visit (HOSPITAL_COMMUNITY): Payer: Self-pay | Admitting: Oncology

## 2011-01-17 ENCOUNTER — Inpatient Hospital Stay (HOSPITAL_COMMUNITY): Payer: Self-pay

## 2011-01-18 ENCOUNTER — Ambulatory Visit
Admission: RE | Admit: 2011-01-18 | Discharge: 2011-01-18 | Disposition: A | Discharge status: Home / Self Care | Payer: Medicaid Other | Source: Ambulatory Visit | Attending: Radiation Oncology | Admitting: Radiation Oncology

## 2011-01-18 ENCOUNTER — Ambulatory Visit: Payer: Medicaid Other | Attending: Gynecologic Oncology | Admitting: Gynecologic Oncology

## 2011-01-18 DIAGNOSIS — Z7982 Long term (current) use of aspirin: Secondary | ICD-10-CM | POA: Insufficient documentation

## 2011-01-18 DIAGNOSIS — Z800 Family history of malignant neoplasm of digestive organs: Secondary | ICD-10-CM | POA: Insufficient documentation

## 2011-01-18 DIAGNOSIS — Z85038 Personal history of other malignant neoplasm of large intestine: Secondary | ICD-10-CM | POA: Insufficient documentation

## 2011-01-18 DIAGNOSIS — C7982 Secondary malignant neoplasm of genital organs: Secondary | ICD-10-CM | POA: Insufficient documentation

## 2011-01-18 DIAGNOSIS — Z90710 Acquired absence of both cervix and uterus: Secondary | ICD-10-CM | POA: Insufficient documentation

## 2011-01-18 DIAGNOSIS — C7960 Secondary malignant neoplasm of unspecified ovary: Secondary | ICD-10-CM | POA: Insufficient documentation

## 2011-01-18 DIAGNOSIS — C786 Secondary malignant neoplasm of retroperitoneum and peritoneum: Secondary | ICD-10-CM | POA: Insufficient documentation

## 2011-01-18 DIAGNOSIS — Z9079 Acquired absence of other genital organ(s): Secondary | ICD-10-CM | POA: Insufficient documentation

## 2011-01-18 DIAGNOSIS — J45909 Unspecified asthma, uncomplicated: Secondary | ICD-10-CM | POA: Insufficient documentation

## 2011-01-18 DIAGNOSIS — E7800 Pure hypercholesterolemia, unspecified: Secondary | ICD-10-CM | POA: Insufficient documentation

## 2011-01-18 DIAGNOSIS — E119 Type 2 diabetes mellitus without complications: Secondary | ICD-10-CM | POA: Insufficient documentation

## 2011-01-19 ENCOUNTER — Other Ambulatory Visit (HOSPITAL_COMMUNITY): Payer: Self-pay

## 2011-01-19 NOTE — Consult Note (Signed)
NAME:  Yolanda Friedman, Yolanda Friedman NO.:  [id number]  MEDICAL RECORD NO.:  [id number]           PATIENT TYPE:  O  LOCATION:  GYN                          FACILITY:  [facility]  PHYSICIAN:  Adilen Pavelko A. Duard Brady, MD    DATE OF BIRTH:  May 18, 1951  DATE OF CONSULTATION:  01/18/2011 DATE OF DISCHARGE:                                CONSULTATION   HISTORY:  Ms. Heenan is a very pleasant 60 year old who was initially seen by [place] in March 2012.  At that time she was noted to have increased abdominal girth for two months' duration.  Imaging revealed a 24 x 16 x 17 cm right solid adnexal mass and a left adnexal mass measuring 16 x 14 x 14 cm.  The uterus was enlarged.  A CEA was elevated at 11.4 and a CA- 125 returned at 58.2.  The patient did have a history of colon cancer in 2005.  She subsequently underwent exploratory laparotomy, TAH-BSO and optimal debulking with Dr. Stanford Breed on December 06, 2010.  Operative findings included dense adhesions of the anterior abdominal wall to the previous midline incision.  Multicystic bilateral ovarian masses with the one on the right being 25 cm and the other 16 cm.  These were densely adherent to the sigmoid mesentery on the left and the mesentery of the small-bowel on the right.  There was a considerable amount of retroperitoneal fibrosis bilaterally.  There were multiple implants in the omentum, none which measured more than 2 cm in diameter.  At the completion of the procedure, only gross residual disease with some small nodules in the posterior cul-de-sac.  Pathology returned as recurrent colon cancer.  She is followed by Dr. Mariel Sleet and was supposed to have her port placed and chemotherapy started.  However, she ate on the morning of her port placement and that had to be deferred to later.  She is otherwise doing quite well.  She does occasionally have some abdominal soreness when she lies on her stomach.  She has lost 16 pounds, but has started  regaining her appetite.  She denies any change in her bowel or bladder habits or any vaginal bleeding.  She is not sure exactly what the plan is for chemotherapy, as she states that everything has been overwhelming and she has not been able to absorb all of the information.  PHYSICAL EXAMINATION:  VITAL SIGNS:  Weight 194 pounds, which is down from 210 pounds.  Blood pressure 155/95. GENERAL:  A well-nourished, well-developed female in no acute distress. ABDOMEN:  Shows a well-healed vertical midline incision.  There is evidence of a superficial abrasion, which measures approximately 1 cm, but that area is granulated and it is healing.  Abdomen is soft and nontender. PELVIC:  External genitalia is within normal limits.  The vagina is atrophic.  The vaginal cuff is visualized and it is well healed. Bimanual examination reveals some postoperative nodularity in the posterior cul-de-sac, but no other palpable masses.  ASSESSMENT AND PLAN:  A 60 year old with recurrent metastatic colon cancer status post optimal debulking, but with still residual disease in the cul-de-sac on April 3.  She is planning on starting chemotherapy with Dr. Mariel Sleet.  She will be released from our clinic.  She knows that we will be happy to see her in the future should the need arise.     Grover Woodfield A. Duard Brady, MD     PAG/MEDQ  D:  01/18/2011  T:  01/18/2011  Job:  [id number]  cc:   Tilda Burrow, M.D. Fax: [phone]  Telford Nab, R.N. [address] [place], [place] [address]  Ladona Horns. Mariel Sleet, MD Fax: [phone]  Electronically Signed by Cleda Mccreedy MD on 01/19/2011 02:31:45 PM

## 2011-01-24 ENCOUNTER — Ambulatory Visit (HOSPITAL_COMMUNITY)
Admission: RE | Admit: 2011-01-24 | Discharge: 2011-01-24 | Disposition: A | Discharge status: Home / Self Care | Payer: Medicaid Other | Source: Ambulatory Visit | Attending: Oncology | Admitting: Oncology

## 2011-01-24 ENCOUNTER — Other Ambulatory Visit (HOSPITAL_COMMUNITY): Payer: Self-pay | Admitting: Oncology

## 2011-01-24 DIAGNOSIS — C7960 Secondary malignant neoplasm of unspecified ovary: Secondary | ICD-10-CM | POA: Insufficient documentation

## 2011-01-24 DIAGNOSIS — C189 Malignant neoplasm of colon, unspecified: Secondary | ICD-10-CM

## 2011-01-24 DIAGNOSIS — Z01812 Encounter for preprocedural laboratory examination: Secondary | ICD-10-CM | POA: Insufficient documentation

## 2011-01-24 LAB — CBC
HCT: 35.4 % — ABNORMAL LOW (ref 36.0–46.0)
MCH: 27.3 pg (ref 26.0–34.0)
MCV: 86.3 fL (ref 78.0–100.0)
Platelets: 265 10*3/uL (ref 150–400)
RBC: 4.1 MIL/uL (ref 3.87–5.11)
WBC: 4.5 10*3/uL (ref 4.0–10.5)

## 2011-01-25 ENCOUNTER — Other Ambulatory Visit (HOSPITAL_COMMUNITY): Payer: Self-pay | Admitting: Oncology

## 2011-01-25 ENCOUNTER — Encounter (HOSPITAL_COMMUNITY): Payer: Medicaid Other

## 2011-01-25 DIAGNOSIS — Z5111 Encounter for antineoplastic chemotherapy: Secondary | ICD-10-CM

## 2011-01-25 DIAGNOSIS — Z5112 Encounter for antineoplastic immunotherapy: Secondary | ICD-10-CM

## 2011-01-25 DIAGNOSIS — C189 Malignant neoplasm of colon, unspecified: Secondary | ICD-10-CM

## 2011-01-25 LAB — COMPREHENSIVE METABOLIC PANEL
ALT: 10 U/L (ref 0–35)
Alkaline Phosphatase: 57 U/L (ref 39–117)
BUN: 14 mg/dL (ref 6–23)
CO2: 25 mEq/L (ref 19–32)
Chloride: 107 mEq/L (ref 96–112)
Glucose, Bld: 108 mg/dL — ABNORMAL HIGH (ref 70–99)
Potassium: 3.7 mEq/L (ref 3.5–5.1)
Sodium: 141 mEq/L (ref 135–145)
Total Bilirubin: 0.2 mg/dL — ABNORMAL LOW (ref 0.3–1.2)

## 2011-01-25 LAB — DIFFERENTIAL
Basophils Relative: 0 % (ref 0–1)
Lymphs Abs: 1.6 10*3/uL (ref 0.7–4.0)
Monocytes Absolute: 0.3 10*3/uL (ref 0.1–1.0)
Monocytes Relative: 6 % (ref 3–12)
Neutro Abs: 2.6 10*3/uL (ref 1.7–7.7)
Neutrophils Relative %: 57 % (ref 43–77)

## 2011-01-25 LAB — CBC
HCT: 34.4 % — ABNORMAL LOW (ref 36.0–46.0)
Hemoglobin: 11 g/dL — ABNORMAL LOW (ref 12.0–15.0)
MCHC: 32 g/dL (ref 30.0–36.0)
RBC: 3.89 MIL/uL (ref 3.87–5.11)

## 2011-01-25 LAB — CEA: CEA: 5 ng/mL (ref 0.0–5.0)

## 2011-01-27 ENCOUNTER — Encounter (HOSPITAL_COMMUNITY): Payer: Medicaid Other

## 2011-01-27 DIAGNOSIS — C189 Malignant neoplasm of colon, unspecified: Secondary | ICD-10-CM

## 2011-01-27 DIAGNOSIS — Z452 Encounter for adjustment and management of vascular access device: Secondary | ICD-10-CM

## 2011-02-06 ENCOUNTER — Other Ambulatory Visit (HOSPITAL_COMMUNITY): Payer: Self-pay | Admitting: Oncology

## 2011-02-06 ENCOUNTER — Encounter (HOSPITAL_COMMUNITY): Payer: Medicaid Other | Attending: Oncology

## 2011-02-06 DIAGNOSIS — C7960 Secondary malignant neoplasm of unspecified ovary: Secondary | ICD-10-CM | POA: Insufficient documentation

## 2011-02-06 DIAGNOSIS — C786 Secondary malignant neoplasm of retroperitoneum and peritoneum: Secondary | ICD-10-CM | POA: Insufficient documentation

## 2011-02-06 DIAGNOSIS — Z85038 Personal history of other malignant neoplasm of large intestine: Secondary | ICD-10-CM | POA: Insufficient documentation

## 2011-02-06 DIAGNOSIS — C189 Malignant neoplasm of colon, unspecified: Secondary | ICD-10-CM

## 2011-02-06 LAB — DIFFERENTIAL
Basophils Absolute: 0 10*3/uL (ref 0.0–0.1)
Basophils Relative: 0 % (ref 0–1)
Lymphocytes Relative: 40 % (ref 12–46)
Monocytes Absolute: 0.5 10*3/uL (ref 0.1–1.0)
Neutro Abs: 2.3 10*3/uL (ref 1.7–7.7)
Neutrophils Relative %: 49 % (ref 43–77)

## 2011-02-06 LAB — CBC
HCT: 37.7 % (ref 36.0–46.0)
MCHC: 31 g/dL (ref 30.0–36.0)
Platelets: 259 10*3/uL (ref 150–400)
RDW: 14.8 % (ref 11.5–15.5)
WBC: 4.8 10*3/uL (ref 4.0–10.5)

## 2011-02-08 ENCOUNTER — Encounter (HOSPITAL_COMMUNITY): Payer: Medicaid Other

## 2011-02-08 ENCOUNTER — Other Ambulatory Visit (HOSPITAL_COMMUNITY): Payer: Self-pay

## 2011-02-08 DIAGNOSIS — Z5112 Encounter for antineoplastic immunotherapy: Secondary | ICD-10-CM

## 2011-02-08 DIAGNOSIS — R51 Headache: Secondary | ICD-10-CM

## 2011-02-08 DIAGNOSIS — Z5111 Encounter for antineoplastic chemotherapy: Secondary | ICD-10-CM

## 2011-02-08 DIAGNOSIS — C189 Malignant neoplasm of colon, unspecified: Secondary | ICD-10-CM

## 2011-02-10 ENCOUNTER — Encounter (HOSPITAL_COMMUNITY): Payer: Medicaid Other

## 2011-02-10 DIAGNOSIS — C189 Malignant neoplasm of colon, unspecified: Secondary | ICD-10-CM

## 2011-02-10 DIAGNOSIS — Z452 Encounter for adjustment and management of vascular access device: Secondary | ICD-10-CM

## 2011-02-10 DIAGNOSIS — C801 Malignant (primary) neoplasm, unspecified: Secondary | ICD-10-CM

## 2011-02-22 ENCOUNTER — Other Ambulatory Visit (HOSPITAL_COMMUNITY): Payer: Self-pay | Admitting: Oncology

## 2011-02-22 ENCOUNTER — Encounter (HOSPITAL_COMMUNITY): Payer: Medicaid Other

## 2011-02-22 DIAGNOSIS — C189 Malignant neoplasm of colon, unspecified: Secondary | ICD-10-CM

## 2011-02-22 DIAGNOSIS — Z5111 Encounter for antineoplastic chemotherapy: Secondary | ICD-10-CM

## 2011-02-22 LAB — DIFFERENTIAL
Basophils Absolute: 0 10*3/uL (ref 0.0–0.1)
Eosinophils Relative: 2 % (ref 0–5)
Lymphocytes Relative: 41 % (ref 12–46)
Neutrophils Relative %: 44 % (ref 43–77)

## 2011-02-22 LAB — URINALYSIS, DIPSTICK ONLY
Glucose, UA: NEGATIVE mg/dL
Ketones, ur: NEGATIVE mg/dL
Leukocytes, UA: NEGATIVE
pH: 7 (ref 5.0–8.0)

## 2011-02-22 LAB — CBC
HCT: 34.6 % — ABNORMAL LOW (ref 36.0–46.0)
Platelets: 178 10*3/uL (ref 150–400)
RBC: 4.17 MIL/uL (ref 3.87–5.11)
RDW: 14.8 % (ref 11.5–15.5)
WBC: 4 10*3/uL (ref 4.0–10.5)

## 2011-02-24 ENCOUNTER — Encounter (HOSPITAL_COMMUNITY): Payer: Medicaid Other | Admitting: Oncology

## 2011-02-24 ENCOUNTER — Encounter (HOSPITAL_COMMUNITY): Payer: Medicaid Other

## 2011-02-24 DIAGNOSIS — C189 Malignant neoplasm of colon, unspecified: Secondary | ICD-10-CM

## 2011-02-27 ENCOUNTER — Ambulatory Visit (HOSPITAL_COMMUNITY): Payer: Medicaid Other | Admitting: Oncology

## 2011-03-06 ENCOUNTER — Encounter (HOSPITAL_COMMUNITY): Payer: Self-pay | Admitting: *Deleted

## 2011-03-06 ENCOUNTER — Other Ambulatory Visit (HOSPITAL_COMMUNITY): Payer: Self-pay | Admitting: Oncology

## 2011-03-06 ENCOUNTER — Encounter (HOSPITAL_COMMUNITY): Payer: Medicaid Other | Attending: Oncology

## 2011-03-06 DIAGNOSIS — E119 Type 2 diabetes mellitus without complications: Secondary | ICD-10-CM | POA: Insufficient documentation

## 2011-03-06 DIAGNOSIS — C189 Malignant neoplasm of colon, unspecified: Secondary | ICD-10-CM

## 2011-03-06 LAB — CBC
HCT: 37.8 % (ref 36.0–46.0)
Hemoglobin: 12.3 g/dL (ref 12.0–15.0)
WBC: 4.6 10*3/uL (ref 4.0–10.5)

## 2011-03-06 LAB — COMPREHENSIVE METABOLIC PANEL
AST: 23 U/L (ref 0–37)
BUN: 10 mg/dL (ref 6–23)
CO2: 24 mEq/L (ref 19–32)
Chloride: 104 mEq/L (ref 96–112)
Creatinine, Ser: 0.61 mg/dL (ref 0.50–1.10)
GFR calc non Af Amer: >60 mL/min (ref >60)
Glucose, Bld: 185 mg/dL — ABNORMAL HIGH (ref 70–99)
Total Bilirubin: 0.3 mg/dL (ref 0.3–1.2)

## 2011-03-06 LAB — DIFFERENTIAL
Lymphocytes Relative: 41 % (ref 12–46)
Lymphs Abs: 1.9 10*3/uL (ref 0.7–4.0)
Monocytes Absolute: 0.5 10*3/uL (ref 0.1–1.0)
Monocytes Relative: 11 % (ref 3–12)
Neutro Abs: 2.1 10*3/uL (ref 1.7–7.7)

## 2011-03-06 LAB — CEA: CEA: 6 ng/mL — ABNORMAL HIGH (ref 0.0–5.0)

## 2011-03-07 ENCOUNTER — Encounter (HOSPITAL_COMMUNITY): Payer: Medicaid Other

## 2011-03-07 DIAGNOSIS — C189 Malignant neoplasm of colon, unspecified: Secondary | ICD-10-CM

## 2011-03-07 DIAGNOSIS — Z5111 Encounter for antineoplastic chemotherapy: Secondary | ICD-10-CM

## 2011-03-07 DIAGNOSIS — R51 Headache: Secondary | ICD-10-CM

## 2011-03-09 ENCOUNTER — Encounter (HOSPITAL_COMMUNITY): Payer: Medicaid Other

## 2011-03-09 DIAGNOSIS — C189 Malignant neoplasm of colon, unspecified: Secondary | ICD-10-CM

## 2011-03-09 DIAGNOSIS — Z452 Encounter for adjustment and management of vascular access device: Secondary | ICD-10-CM

## 2011-03-11 ENCOUNTER — Other Ambulatory Visit (HOSPITAL_COMMUNITY): Payer: Self-pay | Admitting: Oncology

## 2011-03-13 ENCOUNTER — Other Ambulatory Visit (HOSPITAL_COMMUNITY): Payer: Self-pay | Admitting: Oncology

## 2011-03-13 ENCOUNTER — Other Ambulatory Visit (HOSPITAL_COMMUNITY): Payer: Self-pay | Admitting: *Deleted

## 2011-03-13 ENCOUNTER — Encounter (HOSPITAL_COMMUNITY): Payer: Self-pay | Admitting: Oncology

## 2011-03-13 ENCOUNTER — Encounter (HOSPITAL_BASED_OUTPATIENT_CLINIC_OR_DEPARTMENT_OTHER): Payer: Medicaid Other

## 2011-03-13 DIAGNOSIS — C189 Malignant neoplasm of colon, unspecified: Secondary | ICD-10-CM

## 2011-03-13 DIAGNOSIS — Z85038 Personal history of other malignant neoplasm of large intestine: Secondary | ICD-10-CM | POA: Insufficient documentation

## 2011-03-13 DIAGNOSIS — C786 Secondary malignant neoplasm of retroperitoneum and peritoneum: Secondary | ICD-10-CM | POA: Insufficient documentation

## 2011-03-13 HISTORY — DX: Malignant neoplasm of colon, unspecified: C18.9

## 2011-03-13 LAB — BASIC METABOLIC PANEL
Calcium: 9.4 mg/dL (ref 8.4–10.5)
GFR calc non Af Amer: >60 mL/min (ref >60)
Glucose, Bld: 153 mg/dL — ABNORMAL HIGH (ref 70–99)
Sodium: 136 mEq/L (ref 135–145)

## 2011-03-13 NOTE — Progress Notes (Signed)
Yolanda Friedman presented for labwork. Labs per MD order drawn via Peripheral Line 23 gauge needle inserted in rt ac area  Procedure without incident.  Patient tolerated procedure well.

## 2011-03-14 ENCOUNTER — Other Ambulatory Visit (HOSPITAL_COMMUNITY): Payer: Self-pay | Admitting: Oncology

## 2011-03-20 ENCOUNTER — Other Ambulatory Visit (HOSPITAL_COMMUNITY): Payer: Self-pay | Admitting: Oncology

## 2011-03-20 ENCOUNTER — Encounter (HOSPITAL_BASED_OUTPATIENT_CLINIC_OR_DEPARTMENT_OTHER): Payer: Medicaid Other

## 2011-03-20 ENCOUNTER — Telehealth (HOSPITAL_COMMUNITY): Payer: Self-pay | Admitting: *Deleted

## 2011-03-20 DIAGNOSIS — C189 Malignant neoplasm of colon, unspecified: Secondary | ICD-10-CM

## 2011-03-20 LAB — DIFFERENTIAL
Basophils Absolute: 0 10*3/uL (ref 0.0–0.1)
Basophils Relative: 0 % (ref 0–1)
Eosinophils Absolute: 0.1 10*3/uL (ref 0.0–0.7)
Lymphs Abs: 1.8 10*3/uL (ref 0.7–4.0)
Neutrophils Relative %: 49 % (ref 43–77)

## 2011-03-20 LAB — CBC
MCH: 27.4 pg (ref 26.0–34.0)
Platelets: 159 10*3/uL (ref 150–400)
RBC: 4.52 MIL/uL (ref 3.87–5.11)

## 2011-03-20 MED ORDER — TEMAZEPAM 15 MG PO CAPS: 15.0000 mg | ORAL_CAPSULE | Freq: Every evening | ORAL | Status: DC | PRN

## 2011-03-20 MED ORDER — ESCITALOPRAM OXALATE 10 MG PO TABS: 10.0000 mg | ORAL_TABLET | Freq: Every day | ORAL | Status: AC

## 2011-03-20 NOTE — Telephone Encounter (Signed)
Restoril 15mg  po qhs prn #30 called into [facility] today. Originally, a refill was granted but I called back and deleted the refill per Jenita Seashore PA-C. Patient notified.

## 2011-03-20 NOTE — Telephone Encounter (Signed)
Yolanda Anes, PA 03/20/2011 3:52 PM Signed  If the nasal drainage is clear (which is what she complained about in the past), she should take an OTC allergy medicine.  Anti-depressant, we can try Lexapro 10 mg daily #30 with 2 refills. Please verify that she is not on something else though (something tells me that she was on Zoloft).   Patient instructed to take Claritin or Allegra OTC for the clear nasal drainage to see if that works. Patient verbalized understanding. Lexapro 10mg  (above) called into [facility]. Patient notified.

## 2011-03-20 NOTE — Telephone Encounter (Signed)
Dellis Anes, PA 03/20/2011 3:52 PM Signed  If the nasal drainage is clear (which is what she complained about in the past), she should take an OTC allergy medicine.  Anti-depressant, we can try Lexapro 10 mg daily #30 with 2 refills. Please verify that she is not on something else though (something tells me that she was on Zoloft).

## 2011-03-20 NOTE — Telephone Encounter (Signed)
If the nasal drainage is clear (which is what she complained about in the past), she should take an OTC allergy medicine.  Anti-depressant, we can try Lexapro 10 mg daily #30 with 2 refills.  Please verify that she is not on something else though (something tells me that she was on Zoloft).

## 2011-03-21 ENCOUNTER — Encounter (HOSPITAL_BASED_OUTPATIENT_CLINIC_OR_DEPARTMENT_OTHER): Payer: Medicaid Other

## 2011-03-21 VITALS — BP 149/85 | HR 83 | Temp 97.5°F | Wt 193.6 lb

## 2011-03-21 DIAGNOSIS — Z5111 Encounter for antineoplastic chemotherapy: Secondary | ICD-10-CM

## 2011-03-21 DIAGNOSIS — C189 Malignant neoplasm of colon, unspecified: Secondary | ICD-10-CM

## 2011-03-21 MED ORDER — SODIUM CHLORIDE 0.9 % IV SOLN
5.0000 mg/kg | Freq: Once | INTRAVENOUS | Status: AC
  Administered 2011-03-21: 425 mg via INTRAVENOUS
  Filled 2011-03-21: qty 17

## 2011-03-21 MED ORDER — ONDANSETRON 8 MG/50ML IVPB (CHCC): 24.0000 mg | Freq: Once | INTRAVENOUS | Status: DC

## 2011-03-21 MED ORDER — COLD PACK MISC ONCOLOGY: 1.0000 | Freq: Once | Status: AC | PRN

## 2011-03-21 MED ORDER — HOT PACK MISC ONCOLOGY: 1.0000 | Freq: Once | Status: AC | PRN

## 2011-03-21 MED ORDER — SODIUM CHLORIDE 0.9 % IV SOLN
2400.0000 mg/m2 | INTRAVENOUS | Status: DC
  Administered 2011-03-21: 4600 mg via INTRAVENOUS
  Filled 2011-03-21: qty 92

## 2011-03-21 MED ORDER — FLUOROURACIL CHEMO INJECTION 2.5 GM/50ML
400.0000 mg/m2 | Freq: Once | INTRAVENOUS | Status: AC
  Administered 2011-03-21: 750 mg via INTRAVENOUS
  Filled 2011-03-21: qty 15

## 2011-03-21 MED ORDER — SODIUM CHLORIDE 0.9 % IJ SOLN: 10.0000 mL | INTRAMUSCULAR | Status: DC | PRN

## 2011-03-21 MED ORDER — SODIUM CHLORIDE 0.9 % IV SOLN
Freq: Once | INTRAVENOUS | Status: AC
  Administered 2011-03-21: 10:00:00 via INTRAVENOUS

## 2011-03-21 MED ORDER — DEXTROSE 5 % IV SOLN
Freq: Once | INTRAVENOUS | Status: AC
  Administered 2011-03-21: 50 mL via INTRAVENOUS
  Filled 2011-03-21: qty 1000

## 2011-03-21 MED ORDER — DEXAMETHASONE SODIUM PHOSPHATE 10 MG/ML IJ SOLN: 8.0000 mg | Freq: Once | INTRAMUSCULAR | Status: DC

## 2011-03-21 MED ORDER — OXALIPLATIN CHEMO INJECTION 100 MG/20ML
85.0000 mg/m2 | Freq: Once | INTRAVENOUS | Status: AC
  Administered 2011-03-21: 160 mg via INTRAVENOUS
  Filled 2011-03-21: qty 32

## 2011-03-21 MED ORDER — HEPARIN SOD (PORK) LOCK FLUSH 100 UNIT/ML IV SOLN: 500.0000 [IU] | Freq: Once | INTRAVENOUS | Status: AC | PRN

## 2011-03-21 MED ORDER — LORAZEPAM 2 MG/ML IJ SOLN
INTRAMUSCULAR | Status: AC
  Filled 2011-03-21: qty 1

## 2011-03-21 MED ORDER — HEPARIN SOD (PORK) LOCK FLUSH 100 UNIT/ML IV SOLN: 250.0000 [IU] | Freq: Once | INTRAVENOUS | Status: AC | PRN

## 2011-03-21 MED ORDER — ALTEPLASE 2 MG IJ SOLR: 2.0000 mg | Freq: Once | INTRAMUSCULAR | Status: AC | PRN

## 2011-03-21 MED ORDER — LORAZEPAM 2 MG/ML IJ SOLN
1.0000 mg | Freq: Once | INTRAMUSCULAR | Status: AC
  Administered 2011-03-21: 1 mg via INTRAVENOUS

## 2011-03-21 MED ORDER — SODIUM CHLORIDE 0.9 % IV SOLN
Freq: Once | INTRAVENOUS | Status: AC
  Administered 2011-03-21: 24 mg via INTRAVENOUS
  Filled 2011-03-21: qty 12

## 2011-03-21 MED ORDER — LEUCOVORIN CALCIUM INJECTION 100 MG
40.0000 mg | Freq: Once | INTRAMUSCULAR | Status: AC
  Administered 2011-03-21: 40 mg via INTRAVENOUS
  Filled 2011-03-21: qty 2

## 2011-03-21 MED ORDER — SODIUM CHLORIDE 0.9 % IJ SOLN: 3.0000 mL | INTRAMUSCULAR | Status: DC | PRN

## 2011-03-21 NOTE — Progress Notes (Signed)
Labs drawn today for cbc/diff 

## 2011-03-23 ENCOUNTER — Encounter (HOSPITAL_BASED_OUTPATIENT_CLINIC_OR_DEPARTMENT_OTHER): Payer: Medicaid Other

## 2011-03-23 DIAGNOSIS — Z452 Encounter for adjustment and management of vascular access device: Secondary | ICD-10-CM

## 2011-03-23 DIAGNOSIS — C189 Malignant neoplasm of colon, unspecified: Secondary | ICD-10-CM

## 2011-03-23 MED ORDER — HEPARIN SOD (PORK) LOCK FLUSH 100 UNIT/ML IV SOLN
INTRAVENOUS | Status: AC
  Filled 2011-03-23: qty 5

## 2011-03-23 NOTE — Progress Notes (Signed)
63fu continuous iv infusion pump d/c. Port access flushed with ns 20 ml and heparin 500 units. access d/c.  Tolerated well

## 2011-03-24 ENCOUNTER — Encounter (HOSPITAL_BASED_OUTPATIENT_CLINIC_OR_DEPARTMENT_OTHER): Payer: Medicaid Other | Admitting: Oncology

## 2011-03-24 VITALS — BP 121/78 | HR 120 | Temp 98.0°F | Wt 186.5 lb

## 2011-03-24 DIAGNOSIS — E119 Type 2 diabetes mellitus without complications: Secondary | ICD-10-CM

## 2011-03-24 DIAGNOSIS — C189 Malignant neoplasm of colon, unspecified: Secondary | ICD-10-CM

## 2011-03-24 MED ORDER — METFORMIN HCL 500 MG PO TABS: 500.0000 mg | ORAL_TABLET | Freq: Two times a day (BID) | ORAL | Status: DC

## 2011-03-24 NOTE — Progress Notes (Signed)
CLARKE-PEARSON,DANIEL L, MD [address] [place] [address]  1. Metastatic recurrent adenocarcinoma of colon  CBC, Differential, Comprehensive metabolic panel, CEA, CBC, Differential    CURRENT THERAPY: The patient is status post debulking surgery by Dr. Loree Fee.  The date of surgery was on 12/06/2010. The patient is status post 5 cycles of an anticipated 12 cycles of oxaliplatin, leucovorin, Avastin, 5-FU bolus, and 5-FU continuous infusion.  INTERVAL HISTORY: Yolanda Friedman 60 y.o. female returns for a regular followup visit for her metastatic recurrent adenocarcinoma of the colon with right ovary, left ovary, serosal surface of the uterus, omentum, and bilateral fallopian tube involvement. Today the patient explains that she is quite tired. She says she gets tired when her glucose is high. She explained she has not been on diabetes medication in approximately 4 years time. She explained that she has been checking her blood sugars with a glucometer and her highest reading is 280. She explained that she's been averaging approximate 200 points. The patient wonders if she can initiate glucose medication. I would discuss this with Dr. Mariel Sleet from the proposed that we possibly start metformin.  The patient denies any complaints today. She does question whether she would be able to go out of town while she is on chemotherapy. I explained to the patient that we could defer treatment for a short period of time (i.e. one week). However she wants to know if she can get treatment at an outside facility if she is able to go out of town. I asked her to do a little bit more research in this arena and keep [place] informed. We may be able to work this out but I'm not sure that would be the best option for this patient.  The patient is tolerating chemotherapy quite well. She denies any numbness or tingling in her fingers or toes.  Past Medical History  Diagnosis Date  . Cancer   . Diabetes mellitus      no oral or insulin  . Hypertension   . Colon cancer 2005    metastatic 2012  . Metastatic recurrent adenocarcinoma of colon 03/13/2011    has Metastatic recurrent adenocarcinoma of colon on her problem list.      has no known allergies.  Ms. Buel had no medications administered during this visit.  Past Surgical History  Procedure Date  . Cholecystectomy 1987  . Colectomy 2005    colon ca  . Abdominal hysterectomy 12/20/2010    met colon ca  . Portacath placement 01/2011    Denies any headaches, dizziness, double vision, fevers, chills, night sweats, nausea, vomiting, diarrhea, constipation, chest pain, heart palpitations, shortness of breath, blood in stool, black tarry stool, urinary pain, urinary burning, urinary frequency, hematuria.   PHYSICAL EXAMINATION   Filed Vitals:   03/24/11 0953  BP: 121/78  Pulse: 120  Temp: 98 F (36.7 C)    GENERAL:alert, no distress, well nourished, well developed, comfortable and obese SKIN: skin color, texture, turgor are normal, no rashes or significant lesions HEAD: Normocephalic, No masses, lesions, tenderness or abnormalities EYES: normal EARS: External ears normal NECK: supple, no adenopathy, no bruits, trachea midline LYMPH:  no palpable lymphadenopathy, not examined BREAST:not examined LUNGS: clear to auscultation and percussion HEART: regular rate & rhythm, no murmurs, no gallops, S1 normal and S2 normal ABDOMEN:abdomen soft, non-tender, obese and normal bowel sounds BACK: Back symmetric, no curvature., No CVA tenderness EXTREMITIES:less then 2 second capillary refill, no joint deformities, effusion, or inflammation, no edema,  no skin discoloration, no cyanosis  NEURO: alert & oriented x 3 with fluent speech, no focal motor/sensory deficits, gait normal    LABORATORY DATA: Lab Results  Component Value Date   WBC 5.0 03/20/2011   HGB 12.4 03/20/2011   HCT 38.4 03/20/2011   MCV 85.0 03/20/2011   PLT 159 03/20/2011      Chemistry      Component Value Date/Time   NA 136 03/13/2011 1551   K 3.7 03/13/2011 1551   CL 101 03/13/2011 1551   CO2 29 03/13/2011 1551   BUN 9 03/13/2011 1551   CREATININE 0.62 03/13/2011 1551      Component Value Date/Time   CALCIUM 9.4 03/13/2011 1551   ALKPHOS 83 03/06/2011 1100   AST 23 03/06/2011 1100   ALT 20 03/06/2011 1100   BILITOT 0.3 03/06/2011 1100       PATHOLOGY: 1. KRAS MUTATION DETECTED    ASSESSMENT:  1. Recurrent metastatic adenocarcinoma of the colon to uterus, bilateral ovaries, serosal surface of the uterus, omentum, and bilateral fallopian tubes. Status post debulking surgery by Dr. Loree Fee on 12/06/2010. The patient is now status post 5 cycles of planned 12 cycles of oxaliplatin, leucovorin, Avastin, 5-FU bolus, and 5-FU continuous infusion. 2. History of stage III colon cancer in November of 2005 that was treated with surgical resection and likely Xeloda therapy 2 weeks on and 2 weeks off for 6 cycles. She declined intravenous chemotherapy at that time despite the fact that he was recommended. 3. Fatigue 4. History of diabetes mellitus not on any medication presently due to lack of funds in followup. 5. Obesity weighing 190 pounds and 5 for frame.    PLAN:  1. We will obtain laboratory work per her chemotherapy order sheet. 2. I will call in Metformin 500mg  to her Pharmacy.  She will take one tablet, PO, BID 3. The patient will return to clinic in one month's time for followup.   All questions were answered. The patient knows to call the clinic with any problems, questions or concerns. We can certainly see the patient much sooner if necessary.  The patient and plan discussed with Glenford Peers, MD and he is in agreement with the aforementioned.  I spent 15 minutes counseling the patient face to face. The total time spent in the appointment was 30 minutes.  Dequavious Harshberger

## 2011-04-04 ENCOUNTER — Telehealth (HOSPITAL_COMMUNITY): Payer: Self-pay | Admitting: *Deleted

## 2011-04-04 ENCOUNTER — Encounter (HOSPITAL_BASED_OUTPATIENT_CLINIC_OR_DEPARTMENT_OTHER): Payer: Medicaid Other

## 2011-04-04 ENCOUNTER — Encounter (HOSPITAL_COMMUNITY): Payer: Medicaid Other

## 2011-04-04 DIAGNOSIS — Z5111 Encounter for antineoplastic chemotherapy: Secondary | ICD-10-CM

## 2011-04-04 DIAGNOSIS — C189 Malignant neoplasm of colon, unspecified: Secondary | ICD-10-CM

## 2011-04-04 LAB — COMPREHENSIVE METABOLIC PANEL WITH GFR
ALT: 34 U/L (ref 0–35)
AST: 28 U/L (ref 0–37)
Albumin: 3.2 g/dL — ABNORMAL LOW (ref 3.5–5.2)
Alkaline Phosphatase: 70 U/L (ref 39–117)
BUN: 11 mg/dL (ref 6–23)
CO2: 23 meq/L (ref 19–32)
Calcium: 9.6 mg/dL (ref 8.4–10.5)
Chloride: 104 meq/L (ref 96–112)
Creatinine, Ser: 0.52 mg/dL (ref 0.50–1.10)
GFR calc Af Amer: >60 mL/min
GFR calc non Af Amer: >60 mL/min
Glucose, Bld: 130 mg/dL — ABNORMAL HIGH (ref 70–99)
Potassium: 3.6 meq/L (ref 3.5–5.1)
Sodium: 140 meq/L (ref 135–145)
Total Bilirubin: 0.3 mg/dL (ref 0.3–1.2)
Total Protein: 6.9 g/dL (ref 6.0–8.3)

## 2011-04-04 LAB — CBC
HCT: 36 % (ref 36.0–46.0)
Hemoglobin: 11.8 g/dL — ABNORMAL LOW (ref 12.0–15.0)
MCH: 28.2 pg (ref 26.0–34.0)
MCHC: 32.8 g/dL (ref 30.0–36.0)
MCV: 85.9 fL (ref 78.0–100.0)
Platelets: 139 10*3/uL — ABNORMAL LOW (ref 150–400)
RBC: 4.19 MIL/uL (ref 3.87–5.11)
RDW: 17.6 % — ABNORMAL HIGH (ref 11.5–15.5)
WBC: 3.9 10*3/uL — ABNORMAL LOW (ref 4.0–10.5)

## 2011-04-04 LAB — DIFFERENTIAL
Basophils Absolute: 0 10*3/uL (ref 0.0–0.1)
Basophils Relative: 1 % (ref 0–1)
Eosinophils Relative: 1 % (ref 0–5)
Monocytes Absolute: 0.6 10*3/uL (ref 0.1–1.0)

## 2011-04-04 LAB — URINALYSIS, DIPSTICK ONLY
Bilirubin Urine: NEGATIVE
Glucose, UA: >1000 mg/dL — AB
Hgb urine dipstick: NEGATIVE
Ketones, ur: NEGATIVE mg/dL
Protein, ur: NEGATIVE mg/dL
pH: 6.5 (ref 5.0–8.0)

## 2011-04-04 LAB — CEA: CEA: 4.2 ng/mL (ref 0.0–5.0)

## 2011-04-04 MED ORDER — SODIUM CHLORIDE 0.9 % IV SOLN
5.0000 mg/kg | Freq: Once | INTRAVENOUS | Status: AC
  Administered 2011-04-04: 425 mg via INTRAVENOUS
  Filled 2011-04-04: qty 17

## 2011-04-04 MED ORDER — LORAZEPAM 2 MG/ML IJ SOLN
INTRAMUSCULAR | Status: AC
  Administered 2011-04-04: 1 mg via INTRAVENOUS
  Filled 2011-04-04: qty 1

## 2011-04-04 MED ORDER — OXALIPLATIN CHEMO INJECTION 100 MG/20ML
85.0000 mg/m2 | Freq: Once | INTRAVENOUS | Status: AC
  Administered 2011-04-04: 160 mg via INTRAVENOUS
  Filled 2011-04-04: qty 32

## 2011-04-04 MED ORDER — FLUOROURACIL CHEMO INJECTION 5 GM/100ML
2400.0000 mg/m2 | INTRAVENOUS | Status: DC
  Administered 2011-04-04: 4600 mg via INTRAVENOUS
  Filled 2011-04-04: qty 92

## 2011-04-04 MED ORDER — DEXTROSE 5 % IV SOLN
Freq: Once | INTRAVENOUS | Status: AC
  Administered 2011-04-04: 50 mL via INTRAVENOUS
  Filled 2011-04-04: qty 100

## 2011-04-04 MED ORDER — ONDANSETRON 8 MG/50ML IVPB (CHCC): 24.0000 mg | Freq: Once | INTRAVENOUS | Status: DC

## 2011-04-04 MED ORDER — LORAZEPAM 2 MG/ML IJ SOLN
1.0000 mg | Freq: Once | INTRAMUSCULAR | Status: AC
  Administered 2011-04-04: 1 mg via INTRAVENOUS

## 2011-04-04 MED ORDER — SODIUM CHLORIDE 0.9 % IJ SOLN
INTRAMUSCULAR | Status: AC
  Filled 2011-04-04: qty 10

## 2011-04-04 MED ORDER — SODIUM CHLORIDE 0.9 % IJ SOLN: 10.0000 mL | INTRAMUSCULAR | Status: DC | PRN

## 2011-04-04 MED ORDER — HEPARIN SOD (PORK) LOCK FLUSH 100 UNIT/ML IV SOLN: 500.0000 [IU] | Freq: Once | INTRAVENOUS | Status: DC | PRN

## 2011-04-04 MED ORDER — LORAZEPAM 2 MG/ML IJ SOLN
INTRAMUSCULAR | Status: AC
  Filled 2011-04-04: qty 1

## 2011-04-04 MED ORDER — DEXAMETHASONE SODIUM PHOSPHATE 10 MG/ML IJ SOLN: 8.0000 mg | Freq: Once | INTRAMUSCULAR | Status: DC

## 2011-04-04 MED ORDER — FLUOROURACIL CHEMO INJECTION 2.5 GM/50ML
400.0000 mg/m2 | Freq: Once | INTRAVENOUS | Status: AC
  Administered 2011-04-04: 750 mg via INTRAVENOUS
  Filled 2011-04-04: qty 15

## 2011-04-04 MED ORDER — SODIUM CHLORIDE 0.9 % IV SOLN
Freq: Once | INTRAVENOUS | Status: AC
  Administered 2011-04-04: 24 mg via INTRAVENOUS
  Filled 2011-04-04: qty 12

## 2011-04-04 MED ORDER — LEUCOVORIN CALCIUM INJECTION 100 MG
40.0000 mg | Freq: Once | INTRAMUSCULAR | Status: AC
  Administered 2011-04-04: 40 mg via INTRAVENOUS
  Filled 2011-04-04: qty 2

## 2011-04-04 NOTE — Telephone Encounter (Signed)
Yolanda Friedman states that her diabetes medicine is causing diarrhea. Is there anything else we can give her????

## 2011-04-06 ENCOUNTER — Encounter (HOSPITAL_COMMUNITY): Payer: Medicaid Other | Attending: Oncology

## 2011-04-06 DIAGNOSIS — I10 Essential (primary) hypertension: Secondary | ICD-10-CM | POA: Insufficient documentation

## 2011-04-06 DIAGNOSIS — Z452 Encounter for adjustment and management of vascular access device: Secondary | ICD-10-CM

## 2011-04-06 DIAGNOSIS — C189 Malignant neoplasm of colon, unspecified: Secondary | ICD-10-CM | POA: Insufficient documentation

## 2011-04-06 MED ORDER — SODIUM CHLORIDE 0.9 % IJ SOLN
INTRAMUSCULAR | Status: AC
  Administered 2011-04-06: 14:00:00
  Filled 2011-04-06: qty 10

## 2011-04-06 MED ORDER — HEPARIN SOD (PORK) LOCK FLUSH 100 UNIT/ML IV SOLN
INTRAVENOUS | Status: AC
  Administered 2011-04-06: 500 [IU]
  Filled 2011-04-06: qty 5

## 2011-04-06 NOTE — Progress Notes (Signed)
Yolanda Friedman presented for Portacath deaccess and flush. Proper placement of portacath confirmed by CXR. Portacath located rt chest wall.  Portacath flushed with 20ml NS and 500U/41ml Heparin and needle removed intact. Procedure without incident. Patient tolerated procedure well.

## 2011-04-06 NOTE — Progress Notes (Signed)
24 Hour Chemotherapy Follow up Call  Yolanda Friedman presented at clinic today for port to be deaccessed and 39fu to be stopped.  Patient reports that her legs feel like they are going to fall out from under her at any time. This isn't constant but intermittent. Patient instructed that if she gets worse to let us know.  She verbalized understanding. I will also let Jenita Seashore about this.

## 2011-04-17 ENCOUNTER — Encounter (HOSPITAL_BASED_OUTPATIENT_CLINIC_OR_DEPARTMENT_OTHER): Payer: Medicaid Other

## 2011-04-17 DIAGNOSIS — C189 Malignant neoplasm of colon, unspecified: Secondary | ICD-10-CM

## 2011-04-17 LAB — CBC
HCT: 37 % (ref 36.0–46.0)
MCHC: 32.2 g/dL (ref 30.0–36.0)
Platelets: 144 10*3/uL — ABNORMAL LOW (ref 150–400)
RDW: 18.4 % — ABNORMAL HIGH (ref 11.5–15.5)

## 2011-04-17 LAB — DIFFERENTIAL
Basophils Absolute: 0 10*3/uL (ref 0.0–0.1)
Basophils Relative: 0 % (ref 0–1)
Monocytes Absolute: 0.6 10*3/uL (ref 0.1–1.0)
Neutro Abs: 2 10*3/uL (ref 1.7–7.7)
Neutrophils Relative %: 49 % (ref 43–77)

## 2011-04-17 NOTE — Progress Notes (Signed)
Labs drawn today for cbc/diff 

## 2011-04-18 ENCOUNTER — Encounter (HOSPITAL_BASED_OUTPATIENT_CLINIC_OR_DEPARTMENT_OTHER): Payer: Medicaid Other

## 2011-04-18 VITALS — BP 122/79 | HR 101 | Temp 98.5°F | Wt 189.0 lb

## 2011-04-18 DIAGNOSIS — Z5111 Encounter for antineoplastic chemotherapy: Secondary | ICD-10-CM

## 2011-04-18 DIAGNOSIS — C189 Malignant neoplasm of colon, unspecified: Secondary | ICD-10-CM

## 2011-04-18 LAB — URINALYSIS, DIPSTICK ONLY
Bilirubin Urine: NEGATIVE
Glucose, UA: 250 mg/dL — AB
Hgb urine dipstick: NEGATIVE
Ketones, ur: NEGATIVE mg/dL
Leukocytes, UA: NEGATIVE
Nitrite: NEGATIVE
Protein, ur: NEGATIVE mg/dL
Specific Gravity, Urine: <1.005 — ABNORMAL LOW (ref 1.005–1.030)
Urobilinogen, UA: 0.2 mg/dL (ref 0.0–1.0)
pH: 6 (ref 5.0–8.0)

## 2011-04-18 MED ORDER — DEXTROSE 5 % IV SOLN: Freq: Once | INTRAVENOUS | Status: DC

## 2011-04-18 MED ORDER — ONDANSETRON 8 MG/50ML IVPB (CHCC): 24.0000 mg | Freq: Once | INTRAVENOUS | Status: DC

## 2011-04-18 MED ORDER — SODIUM CHLORIDE 0.9 % IV SOLN
2400.0000 mg/m2 | INTRAVENOUS | Status: DC
  Administered 2011-04-18: 4600 mg via INTRAVENOUS
  Filled 2011-04-18: qty 92

## 2011-04-18 MED ORDER — DEXTROSE 5 % IV SOLN
INTRAVENOUS | Status: AC
  Filled 2011-04-18 (×2): qty 50

## 2011-04-18 MED ORDER — DEXAMETHASONE SODIUM PHOSPHATE 10 MG/ML IJ SOLN: 8.0000 mg | Freq: Once | INTRAMUSCULAR | Status: DC

## 2011-04-18 MED ORDER — SODIUM CHLORIDE 0.9 % IV SOLN
INTRAVENOUS | Status: DC
  Administered 2011-04-18: 09:00:00 via INTRAVENOUS

## 2011-04-18 MED ORDER — SODIUM CHLORIDE 0.9 % IV SOLN
Freq: Once | INTRAVENOUS | Status: AC
  Administered 2011-04-18: 10:00:00 via INTRAVENOUS
  Filled 2011-04-18: qty 12

## 2011-04-18 MED ORDER — LEUCOVORIN CALCIUM INJECTION 100 MG
40.0000 mg | Freq: Once | INTRAMUSCULAR | Status: AC
  Administered 2011-04-18: 40 mg via INTRAVENOUS
  Filled 2011-04-18: qty 2

## 2011-04-18 MED ORDER — SODIUM CHLORIDE 0.9 % IJ SOLN
INTRAMUSCULAR | Status: AC
  Administered 2011-04-18: 10 mL
  Filled 2011-04-18: qty 10

## 2011-04-18 MED ORDER — FLUOROURACIL CHEMO INJECTION 2.5 GM/50ML
400.0000 mg/m2 | Freq: Once | INTRAVENOUS | Status: AC
  Administered 2011-04-18: 750 mg via INTRAVENOUS
  Filled 2011-04-18: qty 15

## 2011-04-18 MED ORDER — SODIUM CHLORIDE 0.9 % IJ SOLN
10.0000 mL | INTRAMUSCULAR | Status: DC | PRN
  Administered 2011-04-18: 10 mL

## 2011-04-18 MED ORDER — LORAZEPAM 2 MG/ML IJ SOLN
INTRAMUSCULAR | Status: AC
  Administered 2011-04-18: 1 mg via INTRAVENOUS
  Filled 2011-04-18: qty 1

## 2011-04-18 MED ORDER — HEPARIN SOD (PORK) LOCK FLUSH 100 UNIT/ML IV SOLN
500.0000 [IU] | Freq: Once | INTRAVENOUS | Status: AC | PRN
  Administered 2011-04-18: 500 [IU]

## 2011-04-18 MED ORDER — LORAZEPAM 2 MG/ML IJ SOLN
1.0000 mg | Freq: Once | INTRAMUSCULAR | Status: AC
  Administered 2011-04-18: 1 mg via INTRAVENOUS

## 2011-04-18 MED ORDER — OXALIPLATIN CHEMO INJECTION 100 MG/20ML
85.0000 mg/m2 | Freq: Once | INTRAVENOUS | Status: AC
  Administered 2011-04-18: 160 mg via INTRAVENOUS
  Filled 2011-04-18: qty 32

## 2011-04-19 ENCOUNTER — Telehealth (HOSPITAL_COMMUNITY): Payer: Self-pay

## 2011-04-19 NOTE — Telephone Encounter (Signed)
Did ok.

## 2011-04-20 ENCOUNTER — Encounter (HOSPITAL_BASED_OUTPATIENT_CLINIC_OR_DEPARTMENT_OTHER): Payer: Medicaid Other

## 2011-04-20 ENCOUNTER — Encounter (HOSPITAL_COMMUNITY): Payer: Medicaid Other

## 2011-04-20 DIAGNOSIS — C189 Malignant neoplasm of colon, unspecified: Secondary | ICD-10-CM

## 2011-04-20 DIAGNOSIS — Z5112 Encounter for antineoplastic immunotherapy: Secondary | ICD-10-CM

## 2011-04-20 MED ORDER — SODIUM CHLORIDE 0.9 % IV SOLN
5.0000 mg/kg | Freq: Once | INTRAVENOUS | Status: AC
  Administered 2011-04-20: 425 mg via INTRAVENOUS
  Filled 2011-04-20: qty 17

## 2011-04-20 MED ORDER — HEPARIN SOD (PORK) LOCK FLUSH 100 UNIT/ML IV SOLN
INTRAVENOUS | Status: AC
  Administered 2011-04-20: 500 [IU]
  Filled 2011-04-20: qty 5

## 2011-04-20 MED ORDER — HEPARIN SOD (PORK) LOCK FLUSH 100 UNIT/ML IV SOLN
500.0000 [IU] | Freq: Once | INTRAVENOUS | Status: AC | PRN
  Administered 2011-04-20: 500 [IU]

## 2011-04-20 MED ORDER — SODIUM CHLORIDE 0.9 % IV SOLN
Freq: Once | INTRAVENOUS | Status: AC
  Administered 2011-04-20: 12:00:00 via INTRAVENOUS

## 2011-04-24 ENCOUNTER — Telehealth (HOSPITAL_COMMUNITY): Payer: Self-pay | Admitting: *Deleted

## 2011-04-24 NOTE — Telephone Encounter (Signed)
Message left to call us if any problems.

## 2011-04-26 ENCOUNTER — Other Ambulatory Visit (HOSPITAL_COMMUNITY): Payer: Medicaid Other

## 2011-04-28 ENCOUNTER — Encounter (HOSPITAL_COMMUNITY): Payer: Medicaid Other

## 2011-05-01 ENCOUNTER — Encounter (HOSPITAL_BASED_OUTPATIENT_CLINIC_OR_DEPARTMENT_OTHER): Payer: Medicaid Other | Admitting: Oncology

## 2011-05-01 ENCOUNTER — Encounter (HOSPITAL_COMMUNITY): Payer: Medicaid Other

## 2011-05-01 VITALS — BP 145/84 | HR 90 | Temp 97.9°F | Wt 189.2 lb

## 2011-05-01 DIAGNOSIS — C189 Malignant neoplasm of colon, unspecified: Secondary | ICD-10-CM

## 2011-05-01 DIAGNOSIS — C7982 Secondary malignant neoplasm of genital organs: Secondary | ICD-10-CM

## 2011-05-01 DIAGNOSIS — C7960 Secondary malignant neoplasm of unspecified ovary: Secondary | ICD-10-CM

## 2011-05-01 LAB — DIFFERENTIAL
Basophils Relative: 0 % (ref 0–1)
Eosinophils Absolute: 0 10*3/uL (ref 0.0–0.7)
Eosinophils Relative: 1 % (ref 0–5)
Lymphs Abs: 1.4 10*3/uL (ref 0.7–4.0)
Monocytes Absolute: 0.6 10*3/uL (ref 0.1–1.0)
Monocytes Relative: 16 % — ABNORMAL HIGH (ref 3–12)
Neutrophils Relative %: 45 % (ref 43–77)

## 2011-05-01 LAB — COMPREHENSIVE METABOLIC PANEL
ALT: 27 U/L (ref 0–35)
AST: 25 U/L (ref 0–37)
Calcium: 9.4 mg/dL (ref 8.4–10.5)
Creatinine, Ser: 0.61 mg/dL (ref 0.50–1.10)
Sodium: 139 mEq/L (ref 135–145)
Total Protein: 6.3 g/dL (ref 6.0–8.3)

## 2011-05-01 LAB — CBC
MCH: 28.4 pg (ref 26.0–34.0)
MCHC: 32.3 g/dL (ref 30.0–36.0)
Platelets: 154 10*3/uL (ref 150–400)
RDW: 19.1 % — ABNORMAL HIGH (ref 11.5–15.5)

## 2011-05-01 LAB — CEA: CEA: 3.6 ng/mL (ref 0.0–5.0)

## 2011-05-01 NOTE — Progress Notes (Signed)
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  She had fallopian tube involvement as well.  Yolanda Friedman is now status post 7 treatments with FOLFOX plus Avastin, and she has a planned total of 12.  She has some cold sensitivity.  She has peripheral neuropathy symptoms.  It is grade 1 in her fingers and toes on review of systems.  She is otherwise, I think, doing well.  Her vital signs are certainly very stable.  She has certainly not lost weight. She has no abdominal pain, etc.  Her PET scan, of course, postoperatively was negative for any persistence of disease.  She is accompanied today by her son's significant other.  PHYSICAL EXAMINATION:  Her weight is 189 pounds, blood pressure 145/84, she is afebrile, pulse right around 88-92 and regular, respirations 16 and unlabored.  Skin:  Warm and dry to the touch.  Port-A-Cath in the right upper chest wall is intact.  She has no adenopathy in the cervical, supraclavicular, infraclavicular, axillary or inguinal areas. Lungs:  Clear.  Heart:  A regular rhythm and rate without murmur, rub or gallop.  Abdomen:  Obese, nontender, without organomegaly or masses. There is no obvious ascites.  She has no peripheral edema.  So we will continue therapy, but I am wondering whether or not she would be a candidate for the belly wash procedure at [facility], so I will put a call in to see if that is indeed the case and whether or not they would be we willing to see her in consultation or whether they want to see her in consultation.  I have brought the issue up with her about a possible consultation, went over some of this procedure in moderate detail, but I told her it is just an exploratory phone call for me at this time.  We will see her back as scheduled for the next treatment.    ______________________________ Ladona Horns.  Mariel Sleet, MD ESN/MEDQ  D:  05/01/2011  T:  05/01/2011  Job:  [id number]

## 2011-05-01 NOTE — Progress Notes (Signed)
Labs drawn today for cbc/diff,cea,cmp 

## 2011-05-01 NOTE — Patient Instructions (Signed)
[facility]  Discharge Instructions  RECOMMENDATIONS MADE BY THE CONSULTANT AND ANY TEST RESULTS WILL BE SENT TO YOUR REFERRING DOCTOR.         SPECIAL INSTRUCTIONS/FOLLOW-UP:  You are doing well. We will keep going with current chemotherapy plan.   I acknowledge that I have been informed and understand all the instructions given to me and received a copy. I do not have any more questions at this time, but understand that I may call the Specialty Clinic at [facility] at [phone] during business hours should I have any further questions or need assistance in obtaining follow-up care.    __________________________________________  _____________  __________ Signature of Patient or Authorized Representative            Date                   Time    __________________________________________ Nurse's Signature

## 2011-05-03 ENCOUNTER — Encounter (HOSPITAL_BASED_OUTPATIENT_CLINIC_OR_DEPARTMENT_OTHER): Payer: Medicaid Other

## 2011-05-03 VITALS — BP 161/100 | HR 111 | Temp 97.6°F | Wt 188.8 lb

## 2011-05-03 DIAGNOSIS — Z5111 Encounter for antineoplastic chemotherapy: Secondary | ICD-10-CM

## 2011-05-03 DIAGNOSIS — Z5112 Encounter for antineoplastic immunotherapy: Secondary | ICD-10-CM

## 2011-05-03 DIAGNOSIS — C189 Malignant neoplasm of colon, unspecified: Secondary | ICD-10-CM

## 2011-05-03 DIAGNOSIS — I10 Essential (primary) hypertension: Secondary | ICD-10-CM

## 2011-05-03 DIAGNOSIS — R531 Weakness: Secondary | ICD-10-CM

## 2011-05-03 LAB — URINALYSIS, DIPSTICK ONLY
Bilirubin Urine: NEGATIVE
Nitrite: NEGATIVE
Specific Gravity, Urine: 1.01 (ref 1.005–1.030)
Urobilinogen, UA: 0.2 mg/dL (ref 0.0–1.0)

## 2011-05-03 MED ORDER — LORAZEPAM 2 MG/ML IJ SOLN
INTRAMUSCULAR | Status: AC
  Administered 2011-05-03: 1 mg via INTRAVENOUS
  Filled 2011-05-03: qty 1

## 2011-05-03 MED ORDER — LORAZEPAM 2 MG/ML IJ SOLN
1.0000 mg | Freq: Once | INTRAMUSCULAR | Status: AC
  Administered 2011-05-03: 1 mg via INTRAVENOUS

## 2011-05-03 MED ORDER — ONDANSETRON 8 MG/50ML IVPB (CHCC): 24.0000 mg | Freq: Once | INTRAVENOUS | Status: DC

## 2011-05-03 MED ORDER — DEXTROSE 5 % IV BOLUS
50.0000 mL | INTRAVENOUS | Status: AC
  Administered 2011-05-03: 50 mL via INTRAVENOUS
  Filled 2011-05-03 (×2): qty 50

## 2011-05-03 MED ORDER — SODIUM CHLORIDE 0.9 % IV SOLN
Freq: Once | INTRAVENOUS | Status: AC
  Administered 2011-05-03: 24 mg via INTRAVENOUS
  Filled 2011-05-03: qty 12

## 2011-05-03 MED ORDER — SODIUM CHLORIDE 0.9 % IV SOLN
2400.0000 mg/m2 | INTRAVENOUS | Status: DC
  Administered 2011-05-03: 4600 mg via INTRAVENOUS
  Filled 2011-05-03: qty 92

## 2011-05-03 MED ORDER — SODIUM CHLORIDE 0.9 % IV SOLN
INTRAVENOUS | Status: DC
  Administered 2011-05-03: 500 mL via INTRAVENOUS

## 2011-05-03 MED ORDER — SODIUM CHLORIDE 0.9 % IJ SOLN
INTRAMUSCULAR | Status: AC
  Administered 2011-05-03: 10 mL
  Filled 2011-05-03: qty 10

## 2011-05-03 MED ORDER — DEXTROSE 5 % IV SOLN: Freq: Once | INTRAVENOUS | Status: DC

## 2011-05-03 MED ORDER — SODIUM CHLORIDE 0.9 % IJ SOLN
10.0000 mL | INTRAMUSCULAR | Status: DC | PRN
  Administered 2011-05-03: 10 mL

## 2011-05-03 MED ORDER — SODIUM CHLORIDE 0.9 % IV SOLN
5.0000 mg/kg | Freq: Once | INTRAVENOUS | Status: AC
  Administered 2011-05-03: 425 mg via INTRAVENOUS
  Filled 2011-05-03: qty 17

## 2011-05-03 MED ORDER — OXALIPLATIN CHEMO INJECTION 100 MG/20ML
85.0000 mg/m2 | Freq: Once | INTRAVENOUS | Status: AC
  Administered 2011-05-03: 160 mg via INTRAVENOUS
  Filled 2011-05-03: qty 32

## 2011-05-03 MED ORDER — LEUCOVORIN CALCIUM INJECTION 100 MG
40.0000 mg | Freq: Once | INTRAMUSCULAR | Status: AC
  Administered 2011-05-03: 40 mg via INTRAVENOUS
  Filled 2011-05-03: qty 2

## 2011-05-03 MED ORDER — DEXAMETHASONE SODIUM PHOSPHATE 10 MG/ML IJ SOLN: 8.0000 mg | Freq: Once | INTRAMUSCULAR | Status: DC

## 2011-05-03 MED ORDER — FLUOROURACIL CHEMO INJECTION 2.5 GM/50ML
400.0000 mg/m2 | Freq: Once | INTRAVENOUS | Status: AC
  Administered 2011-05-03: 750 mg via INTRAVENOUS
  Filled 2011-05-03: qty 15

## 2011-05-03 MED ORDER — FUROSEMIDE 20 MG PO TABS
20.0000 mg | ORAL_TABLET | Freq: Once | ORAL | Status: AC
  Administered 2011-05-03: 20 mg via ORAL
  Filled 2011-05-03: qty 1

## 2011-05-03 NOTE — Progress Notes (Signed)
Complains of abdominal cramping and nausea.  Discussed with Dr. Mariel Sleet.  Ativan 1 mg given IVP over 1 minute. 1600 States nausea and cramping has subsided.  1614 BP 161/100, Discussed with Dr. Mariel Sleet 1631 lasix 20mg  po given as ordered. 1645 discharged home with family member. Tolerated treatment well.  [facility] Discharge Instructions for Patients Receiving Chemotherapy  Today you received the following chemotherapy agents oxaliplatin, avastin, leucovorin & 5FU   To help prevent nausea and vomiting after your treatment, we encourage you to take your nausea medication Ativan 1mg  every 3 to 4 hours as needed for nausea or vomiting and Compazine suppositories 25mg  1 per rectum every 6 hours as needed for nausea or vomiting Begin taking it at as needed and take it as often as prescribed for the next 48 hours.   If you develop nausea and vomiting that is not controlled by your nausea medication, call the clinic. If it is after clinic hours your family physician or the after hours number for the clinic or go to the Emergency Department.   BELOW ARE SYMPTOMS THAT SHOULD BE REPORTED IMMEDIATELY:  *FEVER GREATER THAN 101.0 F  *CHILLS WITH OR WITHOUT FEVER  NAUSEA AND VOMITING THAT IS NOT CONTROLLED WITH YOUR NAUSEA MEDICATION  *UNUSUAL SHORTNESS OF BREATH  *UNUSUAL BRUISING OR BLEEDING  TENDERNESS IN MOUTH AND THROAT WITH OR WITHOUT PRESENCE OF ULCERS  *URINARY PROBLEMS  *BOWEL PROBLEMS  UNUSUAL RASH Items with * indicate a potential emergency and should be followed up as soon as possible.  One of the nurses will contact you 24 hours after your treatment. Please let the nurse know about any problems that you may have experienced. Feel free to call the clinic you have any questions or concerns. The clinic phone number is [phone].   I have been informed and understand all the instructions given to me. I know to contact the clinic, my physician, or go to the  Emergency Department if any problems should occur. I do not have any questions at this time, but understand that I may call the clinic during office hours or the Patient Navigator at [phone] should I have any questions or need assistance in obtaining follow up care.    __________________________________________  _____________  __________ Signature of Patient or Authorized Representative            Date                   Time    __________________________________________ Nurse's Signature

## 2011-05-05 ENCOUNTER — Encounter (HOSPITAL_BASED_OUTPATIENT_CLINIC_OR_DEPARTMENT_OTHER): Payer: Medicaid Other

## 2011-05-05 VITALS — BP 125/84 | HR 121 | Temp 98.2°F

## 2011-05-05 DIAGNOSIS — Z452 Encounter for adjustment and management of vascular access device: Secondary | ICD-10-CM

## 2011-05-05 DIAGNOSIS — C189 Malignant neoplasm of colon, unspecified: Secondary | ICD-10-CM

## 2011-05-05 MED ORDER — HEPARIN SOD (PORK) LOCK FLUSH 100 UNIT/ML IV SOLN
INTRAVENOUS | Status: AC
  Administered 2011-05-05: 500 [IU]
  Filled 2011-05-05: qty 5

## 2011-05-05 MED ORDER — HEPARIN SOD (PORK) LOCK FLUSH 100 UNIT/ML IV SOLN
500.0000 [IU] | Freq: Once | INTRAVENOUS | Status: AC | PRN
  Administered 2011-05-05: 500 [IU]
  Filled 2011-05-05: qty 5

## 2011-05-05 MED ORDER — SODIUM CHLORIDE 0.9 % IJ SOLN
INTRAMUSCULAR | Status: AC
  Administered 2011-05-05: 10 mL
  Filled 2011-05-05: qty 10

## 2011-05-05 MED ORDER — SODIUM CHLORIDE 0.9 % IJ SOLN
10.0000 mL | INTRAMUSCULAR | Status: DC | PRN
  Administered 2011-05-05: 10 mL
  Filled 2011-05-05: qty 10

## 2011-05-05 NOTE — Progress Notes (Signed)
Returns for pump d/c.  Complains of persistent nausea.  Is able to drink fluids.  VSS.  Instructed to continue ativan every 3 - 4 hours and to use compazine suppository if nausea not relieved.  Will call patient tomorrow for  F/u.

## 2011-05-06 ENCOUNTER — Telehealth (HOSPITAL_COMMUNITY): Payer: Self-pay

## 2011-05-06 NOTE — Telephone Encounter (Signed)
Per patient feeling much better today.  Nausea and weakness from chemotherapy has improved.  Knows to call if any problems.

## 2011-05-15 ENCOUNTER — Emergency Department (HOSPITAL_COMMUNITY)
Admission: EM | Admit: 2011-05-15 | Discharge: 2011-05-15 | Disposition: A | Discharge status: Home / Self Care | Payer: Medicaid Other | Attending: Emergency Medicine | Admitting: Emergency Medicine

## 2011-05-15 ENCOUNTER — Encounter (HOSPITAL_COMMUNITY): Payer: Self-pay | Admitting: *Deleted

## 2011-05-15 DIAGNOSIS — R209 Unspecified disturbances of skin sensation: Secondary | ICD-10-CM | POA: Insufficient documentation

## 2011-05-15 DIAGNOSIS — I10 Essential (primary) hypertension: Secondary | ICD-10-CM | POA: Insufficient documentation

## 2011-05-15 DIAGNOSIS — M25569 Pain in unspecified knee: Secondary | ICD-10-CM | POA: Insufficient documentation

## 2011-05-15 DIAGNOSIS — Z85038 Personal history of other malignant neoplasm of large intestine: Secondary | ICD-10-CM | POA: Insufficient documentation

## 2011-05-15 DIAGNOSIS — E119 Type 2 diabetes mellitus without complications: Secondary | ICD-10-CM | POA: Insufficient documentation

## 2011-05-15 DIAGNOSIS — S8000XA Contusion of unspecified knee, initial encounter: Secondary | ICD-10-CM

## 2011-05-15 MED ORDER — HYDROCODONE-ACETAMINOPHEN 7.5-325 MG PO TABS: 1.0000 | ORAL_TABLET | Freq: Four times a day (QID) | ORAL | Status: AC | PRN

## 2011-05-15 NOTE — ED Notes (Signed)
Ace wrap applied to R knee. Nad. Aware awaiting on d/c instructions.

## 2011-05-15 NOTE — ED Provider Notes (Signed)
History     CSN: [id number] Arrival date & time: 05/15/2011 11:05 AM  Chief Complaint  Patient presents with  . Fall   HPI Comments: Patient c/o pain to the right knee that began after a fall that occurred 2 days prior to ED arrival.  States she is able to bear weight and move the knee but continues to have pain in it.  Also c/o intrmittent tingling and numbness of her left hand.  She states that she "went down" on her hand and knees when she fell.  Denies persistent numbness or inability to move her fingers.   Patient is a 60 y.o. female presenting with knee pain. The history is provided by the patient.  Knee Pain This is a new problem. The current episode started in the past 7 days. The problem occurs constantly. The problem has been unchanged. Associated symptoms include arthralgias and numbness. Pertinent negatives include no chest pain, fever, headaches, joint swelling, myalgias, neck pain, rash, vomiting or weakness. The symptoms are aggravated by standing, twisting and walking. She has tried nothing for the symptoms. The treatment provided no relief.  Knee Pain This is a new problem. The current episode started in the past 7 days. The problem occurs constantly. The problem has been unchanged. Pertinent negatives include no chest pain and no headaches. The symptoms are aggravated by standing, twisting and walking. She has tried nothing for the symptoms. The treatment provided no relief.    Past Medical History  Diagnosis Date  . Cancer   . Diabetes mellitus     no oral or insulin  . Hypertension   . Colon cancer 2005    metastatic 2012  . Metastatic recurrent adenocarcinoma of colon 03/13/2011    Past Surgical History  Procedure Date  . Cholecystectomy 1987  . Colectomy 2005    colon ca  . Abdominal hysterectomy 12/20/2010    met colon ca  . Portacath placement 01/2011    No family history on file.  History  Substance Use Topics  . Smoking status: Never Smoker   .  Smokeless tobacco: Not on file  . Alcohol Use: No    OB History    Grav Para Term Preterm Abortions TAB SAB Ect Mult Living                  Review of Systems  Constitutional: Negative for fever.  HENT: Negative for neck pain.   Cardiovascular: Negative for chest pain.  Gastrointestinal: Negative for vomiting.  Musculoskeletal: Positive for arthralgias. Negative for myalgias, back pain, joint swelling and gait problem.  Skin: Negative.  Negative for rash.  Neurological: Positive for numbness. Negative for dizziness, weakness and headaches.  Hematological: Does not bruise/bleed easily.  All other systems reviewed and are negative.    Physical Exam  BP 147/100  Pulse 85  Temp(Src) 97.8 F (36.6 C) (Oral)  Resp 16  Ht 5' (1.524 m)  Wt 183 lb (83.008 kg)  BMI 35.74 kg/m2  SpO2 100%  Physical Exam  Nursing note and vitals reviewed. Constitutional: She is oriented to person, place, and time. She appears well-developed and well-nourished. No distress.  HENT:  Head: Normocephalic and atraumatic.  Mouth/Throat: Oropharynx is clear and moist.  Neck: Normal range of motion. Neck supple.  Cardiovascular: Normal rate, regular rhythm and normal heart sounds.   Pulmonary/Chest: Effort normal and breath sounds normal.  Musculoskeletal: She exhibits tenderness. She exhibits no edema.       Right knee: She exhibits normal  range of motion, no swelling, no effusion, no deformity, normal alignment and no MCL laxity. tenderness found. No MCL and no patellar tendon tenderness noted.       Diffuse ttp of the anterior knee, no obvious ligament instability or effusion.  Pt has full ROM of the knee  Lymphadenopathy:    She has no cervical adenopathy.  Neurological: She is alert and oriented to person, place, and time. She has normal strength. No cranial nerve deficit or sensory deficit. She exhibits normal muscle tone. Coordination and gait normal.  Reflex Scores:      Patellar reflexes are 2+  on the right side and 2+ on the left side.      Achilles reflexes are 2+ on the right side and 2+ on the left side. Skin: Skin is warm and dry.    ED Course  Procedures  MDM   ACE wrap was applied to the right knee by nursing staff.  Pain improved.  NV intact.  Ambulates w/o difficulty.  DP pulses are strong bilaterally.  No obvious effusion or ligament instability.  Likely contusion of the knee.  Pt agrees to f/u with her PMD.      Patient / Family / Caregiver understand and agree with initial ED impression and plan with expectations set for ED visit.   Medical screening examination/treatment/procedure(s) were performed by non-physician practitioner and as supervising physician I was immediately available for consultation/collaboration. Carleene Cooper III,M.D.    Tammy L. Lincoln, [place] 05/17/11 1726  Carleene Cooper III, MD 05/18/11 1340

## 2011-05-15 NOTE — ED Notes (Signed)
Fall x 2 days ago, c/o right knee pain.  Denies hitting head/loc.  Also c/o numbness to left hand.

## 2011-05-16 ENCOUNTER — Encounter (HOSPITAL_COMMUNITY): Payer: Medicaid Other | Attending: Oncology

## 2011-05-16 DIAGNOSIS — C189 Malignant neoplasm of colon, unspecified: Secondary | ICD-10-CM

## 2011-05-16 DIAGNOSIS — J069 Acute upper respiratory infection, unspecified: Secondary | ICD-10-CM | POA: Insufficient documentation

## 2011-05-16 LAB — DIFFERENTIAL
Basophils Relative: 1 % (ref 0–1)
Eosinophils Absolute: 0 10*3/uL (ref 0.0–0.7)
Eosinophils Relative: 1 % (ref 0–5)
Lymphs Abs: 1.5 10*3/uL (ref 0.7–4.0)
Monocytes Relative: 11 % (ref 3–12)

## 2011-05-16 LAB — CBC
Hemoglobin: 11.3 g/dL — ABNORMAL LOW (ref 12.0–15.0)
MCH: 28.9 pg (ref 26.0–34.0)
MCHC: 32.2 g/dL (ref 30.0–36.0)
MCV: 89.8 fL (ref 78.0–100.0)
RBC: 3.91 MIL/uL (ref 3.87–5.11)

## 2011-05-16 NOTE — Progress Notes (Signed)
Labs drawn today for cbc/diff 

## 2011-05-17 ENCOUNTER — Encounter (HOSPITAL_BASED_OUTPATIENT_CLINIC_OR_DEPARTMENT_OTHER): Payer: Medicaid Other

## 2011-05-17 VITALS — BP 169/95 | HR 90 | Temp 98.0°F | Wt 188.6 lb

## 2011-05-17 DIAGNOSIS — Z5111 Encounter for antineoplastic chemotherapy: Secondary | ICD-10-CM

## 2011-05-17 DIAGNOSIS — J069 Acute upper respiratory infection, unspecified: Secondary | ICD-10-CM

## 2011-05-17 DIAGNOSIS — C189 Malignant neoplasm of colon, unspecified: Secondary | ICD-10-CM

## 2011-05-17 LAB — URINALYSIS, DIPSTICK ONLY
Ketones, ur: NEGATIVE mg/dL
Leukocytes, UA: NEGATIVE
Nitrite: NEGATIVE
Specific Gravity, Urine: <1.005 — ABNORMAL LOW (ref 1.005–1.030)
Urobilinogen, UA: 0.2 mg/dL (ref 0.0–1.0)
pH: 6.5 (ref 5.0–8.0)

## 2011-05-17 MED ORDER — LORAZEPAM 2 MG/ML IJ SOLN
INTRAMUSCULAR | Status: AC
  Administered 2011-05-17: 1 mg via INTRAVENOUS
  Filled 2011-05-17: qty 1

## 2011-05-17 MED ORDER — SODIUM CHLORIDE 0.9 % IJ SOLN
10.0000 mL | INTRAMUSCULAR | Status: DC | PRN
  Filled 2011-05-17: qty 10

## 2011-05-17 MED ORDER — HEPARIN SOD (PORK) LOCK FLUSH 100 UNIT/ML IV SOLN
500.0000 [IU] | Freq: Once | INTRAVENOUS | Status: DC | PRN
  Filled 2011-05-17: qty 5

## 2011-05-17 MED ORDER — SODIUM CHLORIDE 0.9 % IV SOLN
5.0000 mg/kg | Freq: Once | INTRAVENOUS | Status: AC
  Administered 2011-05-17: 425 mg via INTRAVENOUS
  Filled 2011-05-17: qty 17

## 2011-05-17 MED ORDER — FLUOROURACIL CHEMO INJECTION 2.5 GM/50ML
400.0000 mg/m2 | Freq: Once | INTRAVENOUS | Status: AC
  Administered 2011-05-17: 750 mg via INTRAVENOUS
  Filled 2011-05-17 (×2): qty 15

## 2011-05-17 MED ORDER — DEXAMETHASONE SODIUM PHOSPHATE 10 MG/ML IJ SOLN: 8.0000 mg | Freq: Once | INTRAMUSCULAR | Status: DC

## 2011-05-17 MED ORDER — ONDANSETRON 8 MG/50ML IVPB (CHCC): 24.0000 mg | Freq: Once | INTRAVENOUS | Status: DC

## 2011-05-17 MED ORDER — DEXTROSE 5 % IV SOLN: Freq: Once | INTRAVENOUS | Status: DC

## 2011-05-17 MED ORDER — OXALIPLATIN CHEMO INJECTION 100 MG/20ML
85.0000 mg/m2 | Freq: Once | INTRAVENOUS | Status: AC
  Administered 2011-05-17: 160 mg via INTRAVENOUS
  Filled 2011-05-17: qty 32

## 2011-05-17 MED ORDER — LEUCOVORIN CALCIUM INJECTION 100 MG
40.0000 mg | Freq: Once | INTRAMUSCULAR | Status: AC
  Administered 2011-05-17: 40 mg via INTRAVENOUS
  Filled 2011-05-17 (×2): qty 2

## 2011-05-17 MED ORDER — DEXTROSE 5 % IV BOLUS
50.0000 mL | INTRAVENOUS | Status: AC
  Administered 2011-05-17: 50 mL via INTRAVENOUS
  Filled 2011-05-17 (×2): qty 50

## 2011-05-17 MED ORDER — LORAZEPAM 2 MG/ML IJ SOLN
1.0000 mg | Freq: Once | INTRAMUSCULAR | Status: AC
  Administered 2011-05-17: 1 mg via INTRAVENOUS

## 2011-05-17 MED ORDER — SODIUM CHLORIDE 0.9 % IV SOLN
Freq: Once | INTRAVENOUS | Status: AC
  Administered 2011-05-17: 24 mg via INTRAVENOUS
  Filled 2011-05-17: qty 12

## 2011-05-17 MED ORDER — AZITHROMYCIN 250 MG PO TABS: ORAL_TABLET | ORAL | Status: AC

## 2011-05-17 MED ORDER — SODIUM CHLORIDE 0.9 % IV SOLN
2400.0000 mg/m2 | INTRAVENOUS | Status: DC
  Administered 2011-05-17 (×2): 4600 mg via INTRAVENOUS
  Filled 2011-05-17 (×2): qty 92

## 2011-05-19 ENCOUNTER — Encounter (HOSPITAL_BASED_OUTPATIENT_CLINIC_OR_DEPARTMENT_OTHER): Payer: Medicaid Other

## 2011-05-19 VITALS — BP 133/87 | HR 98 | Temp 99.4°F

## 2011-05-19 DIAGNOSIS — C189 Malignant neoplasm of colon, unspecified: Secondary | ICD-10-CM

## 2011-05-19 MED ORDER — SODIUM CHLORIDE 0.9 % IJ SOLN
10.0000 mL | INTRAMUSCULAR | Status: DC | PRN
  Administered 2011-05-19: 10 mL via INTRAVENOUS
  Filled 2011-05-19: qty 10

## 2011-05-19 MED ORDER — HEPARIN SOD (PORK) LOCK FLUSH 100 UNIT/ML IV SOLN
500.0000 [IU] | Freq: Once | INTRAVENOUS | Status: AC
  Administered 2011-05-19: 500 [IU] via INTRAVENOUS
  Filled 2011-05-19: qty 5

## 2011-05-19 MED ORDER — HEPARIN SOD (PORK) LOCK FLUSH 100 UNIT/ML IV SOLN
INTRAVENOUS | Status: AC
  Administered 2011-05-19: 500 [IU] via INTRAVENOUS
  Filled 2011-05-19: qty 5

## 2011-05-19 MED ORDER — SODIUM CHLORIDE 0.9 % IJ SOLN
INTRAMUSCULAR | Status: AC
  Administered 2011-05-19: 10 mL via INTRAVENOUS
  Filled 2011-05-19: qty 10

## 2011-05-19 NOTE — Progress Notes (Signed)
Yolanda Friedman presented for Portacath deaccess and flush after home infusion pump d/cec. Proper placement of portacath confirmed by CXR. Portacath located lt chest wall accessed with  H 20 needle. Good blood return present. Portacath flushed with 20ml NS and 500U/59ml Heparin and needle removed intact. Procedure without incident. Patient tolerated procedure well. Doing OK after treatment. C/O hands cold and painful at times and legs weak. Will discuss with MD on 9/25.

## 2011-05-23 ENCOUNTER — Other Ambulatory Visit (HOSPITAL_COMMUNITY): Payer: Self-pay | Admitting: Oncology

## 2011-05-23 DIAGNOSIS — K219 Gastro-esophageal reflux disease without esophagitis: Secondary | ICD-10-CM

## 2011-05-23 MED ORDER — OMEPRAZOLE 20 MG PO CPDR: 20.0000 mg | DELAYED_RELEASE_CAPSULE | Freq: Every day | ORAL | Status: DC

## 2011-05-25 ENCOUNTER — Other Ambulatory Visit (HOSPITAL_COMMUNITY): Payer: Self-pay | Admitting: Oncology

## 2011-05-25 ENCOUNTER — Telehealth (HOSPITAL_COMMUNITY): Payer: Self-pay | Admitting: *Deleted

## 2011-05-25 MED ORDER — AMLODIPINE BESY-BENAZEPRIL HCL 5-10 MG PO CAPS: 1.0000 | ORAL_CAPSULE | Freq: Every day | ORAL | Status: DC

## 2011-05-25 NOTE — Telephone Encounter (Signed)
Message copied by Dennie Maizes on Thu May 25, 2011  3:58 PM ------      Message from: Ellouise Newer III      Created: Thu May 25, 2011  1:15 PM       Blood pressure medicine e-prescribed to [facility].  It is a different blood pressure medicine because the insurance will not pay for it at this time.              She needs to call us if she starts to develop a dry cough after beginning the medication.            Please call her and let he know.

## 2011-05-25 NOTE — Telephone Encounter (Signed)
Spoke with pt as below. Verbalizes understanding.

## 2011-05-30 ENCOUNTER — Other Ambulatory Visit (HOSPITAL_COMMUNITY): Payer: Medicaid Other

## 2011-05-30 ENCOUNTER — Ambulatory Visit (HOSPITAL_COMMUNITY): Payer: Medicaid Other | Admitting: Oncology

## 2011-05-31 ENCOUNTER — Encounter (HOSPITAL_BASED_OUTPATIENT_CLINIC_OR_DEPARTMENT_OTHER): Payer: Medicaid Other

## 2011-05-31 VITALS — BP 154/94 | HR 102 | Temp 97.7°F | Wt 190.0 lb

## 2011-05-31 DIAGNOSIS — C189 Malignant neoplasm of colon, unspecified: Secondary | ICD-10-CM

## 2011-05-31 DIAGNOSIS — Z5111 Encounter for antineoplastic chemotherapy: Secondary | ICD-10-CM

## 2011-05-31 LAB — CBC
HCT: 33.2 % — ABNORMAL LOW (ref 36.0–46.0)
MCH: 29.3 pg (ref 26.0–34.0)
MCHC: 31.9 g/dL (ref 30.0–36.0)
MCV: 91.7 fL (ref 78.0–100.0)
Platelets: 124 10*3/uL — ABNORMAL LOW (ref 150–400)
RDW: 19.2 % — ABNORMAL HIGH (ref 11.5–15.5)
WBC: 3.6 10*3/uL — ABNORMAL LOW (ref 4.0–10.5)

## 2011-05-31 LAB — URINALYSIS, DIPSTICK ONLY
Bilirubin Urine: NEGATIVE
Glucose, UA: 500 mg/dL — AB
Ketones, ur: NEGATIVE mg/dL
Leukocytes, UA: NEGATIVE
Nitrite: NEGATIVE
Specific Gravity, Urine: 1.015 (ref 1.005–1.030)
pH: 8 (ref 5.0–8.0)

## 2011-05-31 LAB — DIFFERENTIAL
Basophils Absolute: 0 10*3/uL (ref 0.0–0.1)
Basophils Relative: 0 % (ref 0–1)
Eosinophils Absolute: 0 10*3/uL (ref 0.0–0.7)
Eosinophils Relative: 0 % (ref 0–5)
Lymphocytes Relative: 40 % (ref 12–46)
Monocytes Absolute: 0.5 10*3/uL (ref 0.1–1.0)

## 2011-05-31 LAB — COMPREHENSIVE METABOLIC PANEL
ALT: 20 U/L (ref 0–35)
AST: 26 U/L (ref 0–37)
Albumin: 3.1 g/dL — ABNORMAL LOW (ref 3.5–5.2)
Calcium: 9.4 mg/dL (ref 8.4–10.5)
Creatinine, Ser: 0.55 mg/dL (ref 0.50–1.10)
GFR calc non Af Amer: >60 mL/min (ref >60)
Sodium: 141 mEq/L (ref 135–145)
Total Protein: 6.4 g/dL (ref 6.0–8.3)

## 2011-05-31 MED ORDER — DEXAMETHASONE SODIUM PHOSPHATE 10 MG/ML IJ SOLN: 8.0000 mg | Freq: Once | INTRAMUSCULAR | Status: DC

## 2011-05-31 MED ORDER — HEPARIN SOD (PORK) LOCK FLUSH 100 UNIT/ML IV SOLN
500.0000 [IU] | Freq: Once | INTRAVENOUS | Status: DC | PRN
  Filled 2011-05-31: qty 5

## 2011-05-31 MED ORDER — ONDANSETRON 8 MG/50ML IVPB (CHCC): 24.0000 mg | Freq: Once | INTRAVENOUS | Status: DC

## 2011-05-31 MED ORDER — LORAZEPAM 2 MG/ML IJ SOLN
INTRAMUSCULAR | Status: AC
  Administered 2011-05-31: 1 mg via INTRAVENOUS
  Filled 2011-05-31: qty 1

## 2011-05-31 MED ORDER — SODIUM CHLORIDE 0.9 % IJ SOLN
INTRAMUSCULAR | Status: AC
  Filled 2011-05-31: qty 10

## 2011-05-31 MED ORDER — LORAZEPAM 2 MG/ML IJ SOLN
1.0000 mg | Freq: Once | INTRAMUSCULAR | Status: AC
  Administered 2011-05-31: 1 mg via INTRAVENOUS

## 2011-05-31 MED ORDER — SODIUM CHLORIDE 0.9 % IV SOLN
5.0000 mg/kg | Freq: Once | INTRAVENOUS | Status: AC
  Administered 2011-05-31: 425 mg via INTRAVENOUS
  Filled 2011-05-31: qty 17

## 2011-05-31 MED ORDER — DEXTROSE 5 % IV SOLN
Freq: Once | INTRAVENOUS | Status: AC
  Administered 2011-05-31: 50 mL via INTRAVENOUS
  Filled 2011-05-31: qty 1000

## 2011-05-31 MED ORDER — SODIUM CHLORIDE 0.9 % IJ SOLN
10.0000 mL | INTRAMUSCULAR | Status: DC | PRN
  Administered 2011-05-31: 10 mL
  Filled 2011-05-31: qty 10

## 2011-05-31 MED ORDER — LEUCOVORIN CALCIUM INJECTION 100 MG
40.0000 mg | Freq: Once | INTRAMUSCULAR | Status: AC
  Administered 2011-05-31: 40 mg via INTRAVENOUS
  Filled 2011-05-31: qty 2

## 2011-05-31 MED ORDER — FLUOROURACIL CHEMO INJECTION 5 GM/100ML
2400.0000 mg/m2 | INTRAVENOUS | Status: DC
  Administered 2011-05-31: 4600 mg via INTRAVENOUS
  Filled 2011-05-31 (×2): qty 92

## 2011-05-31 MED ORDER — SODIUM CHLORIDE 0.9 % IV SOLN
Freq: Once | INTRAVENOUS | Status: AC
  Administered 2011-05-31: 500 mL via INTRAVENOUS

## 2011-05-31 MED ORDER — ONDANSETRON HCL 4 MG/2ML IJ SOLN
Freq: Once | INTRAMUSCULAR | Status: AC
  Administered 2011-05-31: 24 mg via INTRAVENOUS
  Filled 2011-05-31: qty 12

## 2011-05-31 MED ORDER — DEXTROSE 5 % IV BOLUS
50.0000 mL | INTRAVENOUS | Status: AC
  Filled 2011-05-31 (×2): qty 50

## 2011-05-31 MED ORDER — OXALIPLATIN CHEMO INJECTION 100 MG/20ML
85.0000 mg/m2 | Freq: Once | INTRAVENOUS | Status: AC
  Administered 2011-05-31: 160 mg via INTRAVENOUS
  Filled 2011-05-31: qty 32

## 2011-05-31 MED ORDER — FLUOROURACIL CHEMO INJECTION 2.5 GM/50ML
400.0000 mg/m2 | Freq: Once | INTRAVENOUS | Status: AC
  Administered 2011-05-31: 750 mg via INTRAVENOUS
  Filled 2011-05-31: qty 15

## 2011-05-31 NOTE — Progress Notes (Signed)
Tolerated chemo well. Samuella Bruin PA in to examine pt r/t cold symptoms and treatment options discussed.

## 2011-05-31 NOTE — Patient Instructions (Addendum)
[facility] Discharge Instructions for Patients Receiving Chemotherapy  Today you received the following chemotherapy agents  Oxaliplatin, avastin, leucovorin, and 63fu with pump  To help prevent nausea and vomiting after your treatment, we encourage you to take your nausea medication Ativan 1mg  every 3 to 4 hours as needed for nausea or vomiting Begin taking it at as needed and take it as often as prescribed for the next 48 hours.  If your insurance doesn't cover tussionex you can continue the OTC cough suppressants or Delysum.  Call us if you feel worse have fever or cough up yellow greenish stuff.   If you develop nausea and vomiting that is not controlled by your nausea medication, call the clinic. If it is after clinic hours your family physician or the after hours number for the clinic or go to the Emergency Department.   BELOW ARE SYMPTOMS THAT SHOULD BE REPORTED IMMEDIATELY:  *FEVER GREATER THAN 101.0 F  *CHILLS WITH OR WITHOUT FEVER  NAUSEA AND VOMITING THAT IS NOT CONTROLLED WITH YOUR NAUSEA MEDICATION  *UNUSUAL SHORTNESS OF BREATH  *UNUSUAL BRUISING OR BLEEDING  TENDERNESS IN MOUTH AND THROAT WITH OR WITHOUT PRESENCE OF ULCERS  *URINARY PROBLEMS  *BOWEL PROBLEMS  UNUSUAL RASH Items with * indicate a potential emergency and should be followed up as soon as possible.  One of the nurses will contact you 24 hours after your treatment. Please let the nurse know about any problems that you may have experienced. Feel free to call the clinic you have any questions or concerns. The clinic phone number is [phone].   I have been informed and understand all the instructions given to me. I know to contact the clinic, my physician, or go to the Emergency Department if any problems should occur. I do not have any questions at this time, but understand that I may call the clinic during office hours or the Patient Navigator at [phone] should I have any  questions or need assistance in obtaining follow up care.    __________________________________________  _____________  __________ Signature of Patient or Authorized Representative            Date                   Time    __________________________________________ Nurse's Signature

## 2011-06-01 ENCOUNTER — Telehealth (HOSPITAL_COMMUNITY): Payer: Self-pay | Admitting: *Deleted

## 2011-06-01 ENCOUNTER — Other Ambulatory Visit (HOSPITAL_COMMUNITY): Payer: Self-pay | Admitting: Oncology

## 2011-06-01 NOTE — Telephone Encounter (Signed)
24 hour call back made to patient. Patient states that she does not want to take her current BP med because it is making her mouth/throat extremely dry. I instructed patient to try a humidifier and to use cough drops. She said that she hasn't tried a humidifier but she had cough drops and other cold medications. She really wants to know if the blood pressure med is causing the extreme dry mouth/throat (she really feels like it is the problem). She said that she will not take this pill much longer if she feels like this is the problem.

## 2011-06-01 NOTE — Telephone Encounter (Signed)
Can be from new BP med.  Will get her other med authorized by [employer] and will prescribe that one back to her.

## 2011-06-01 NOTE — Telephone Encounter (Signed)
Patient notified that K level was low and that she needed K replacement. Med called into [facility] pharmacy today.

## 2011-06-02 ENCOUNTER — Encounter (HOSPITAL_COMMUNITY): Payer: Self-pay

## 2011-06-02 ENCOUNTER — Other Ambulatory Visit (HOSPITAL_COMMUNITY): Payer: Self-pay | Admitting: Oncology

## 2011-06-02 ENCOUNTER — Encounter (HOSPITAL_BASED_OUTPATIENT_CLINIC_OR_DEPARTMENT_OTHER): Payer: Medicaid Other

## 2011-06-02 ENCOUNTER — Telehealth (HOSPITAL_COMMUNITY): Payer: Self-pay

## 2011-06-02 DIAGNOSIS — C7982 Secondary malignant neoplasm of genital organs: Secondary | ICD-10-CM

## 2011-06-02 DIAGNOSIS — C189 Malignant neoplasm of colon, unspecified: Secondary | ICD-10-CM

## 2011-06-02 DIAGNOSIS — Z452 Encounter for adjustment and management of vascular access device: Secondary | ICD-10-CM

## 2011-06-02 DIAGNOSIS — C786 Secondary malignant neoplasm of retroperitoneum and peritoneum: Secondary | ICD-10-CM

## 2011-06-02 DIAGNOSIS — I10 Essential (primary) hypertension: Secondary | ICD-10-CM

## 2011-06-02 MED ORDER — SODIUM CHLORIDE 0.9 % IJ SOLN
INTRAMUSCULAR | Status: AC
  Administered 2011-06-02: 10 mL
  Filled 2011-06-02: qty 10

## 2011-06-02 MED ORDER — AMLODIPINE BESYLATE-VALSARTAN 5-160 MG PO TABS: 1.0000 | ORAL_TABLET | Freq: Every day | ORAL | Status: DC

## 2011-06-02 MED ORDER — HEPARIN SOD (PORK) LOCK FLUSH 100 UNIT/ML IV SOLN
INTRAVENOUS | Status: AC
  Administered 2011-06-02: 500 [IU]
  Filled 2011-06-02: qty 5

## 2011-06-02 NOTE — Progress Notes (Signed)
Yolanda Friedman presented for Portacath access and flush. Proper placement of portacath confirmed by CXR. Portacath located rt chest wall accessed with  H 20 needle. Good blood return present. Portacath flushed with 20ml NS and 500U/15ml Heparin and needle removed intact. Procedure without incident. Patient tolerated procedure well.  33fu pump removed from patient.

## 2011-06-02 NOTE — Progress Notes (Unsigned)
Correction:  Medication to be d/c was Lotrel not Lisinopril and to start Exforge.

## 2011-06-02 NOTE — Telephone Encounter (Signed)
   Dellis Anes, PA More Detail >>      Dellis Anes, [place]        Sent: Caleen Essex June 02, 2011 11:01 AM    To: Filiberto Pinks Nurse Ap        [place]    MRN: [id number] DOB: 24-Dec-1950     Pt Home: [phone]               Message     Insurance is now going to cover her old HTN medicine due to side effects of the one she is on now.  Rx e-scribed to [employer] [facility].  This is her old BP medicine that worked for her.     Patient notified that Exforge was e-scribed to [facility] and she is to stop the lisinopril and start the Exforge.

## 2011-06-06 ENCOUNTER — Encounter (HOSPITAL_COMMUNITY): Payer: Medicaid Other | Attending: Oncology | Admitting: Oncology

## 2011-06-06 VITALS — BP 169/90 | HR 85 | Temp 97.0°F | Wt 184.4 lb

## 2011-06-06 DIAGNOSIS — C786 Secondary malignant neoplasm of retroperitoneum and peritoneum: Secondary | ICD-10-CM

## 2011-06-06 DIAGNOSIS — C189 Malignant neoplasm of colon, unspecified: Secondary | ICD-10-CM | POA: Insufficient documentation

## 2011-06-06 NOTE — Progress Notes (Signed)
This office note has been dictated.

## 2011-06-06 NOTE — Patient Instructions (Signed)
[facility]  Discharge Instructions  RECOMMENDATIONS MADE BY THE CONSULTANT AND ANY TEST RESULTS WILL BE SENT TO YOUR REFERRING DOCTOR.   EXAM FINDINGS BY MD TODAY AND SIGNS AND SYMPTOMS TO REPORT TO CLINIC OR PRIMARY MD: You are doing well. 2 more treatments!    INSTRUCTIONS GIVEN AND DISCUSSED: Other  : Try to get out and walk some to get yourself in shape for surgery.  SPECIAL INSTRUCTIONS/FOLLOW-UP: Return to Clinic in 1 month to see PA   I acknowledge that I have been informed and understand all the instructions given to me and received a copy. I do not have any more questions at this time, but understand that I may call the Specialty Clinic at [facility] at [phone] during business hours should I have any further questions or need assistance in obtaining follow-up care.    __________________________________________  _____________  __________ Signature of Patient or Authorized Representative            Date                   Time    __________________________________________ Nurse's Signature

## 2011-06-12 ENCOUNTER — Encounter (HOSPITAL_COMMUNITY): Payer: Medicaid Other

## 2011-06-13 ENCOUNTER — Encounter (HOSPITAL_BASED_OUTPATIENT_CLINIC_OR_DEPARTMENT_OTHER): Payer: Medicaid Other

## 2011-06-13 ENCOUNTER — Encounter (HOSPITAL_COMMUNITY): Payer: Medicaid Other

## 2011-06-13 ENCOUNTER — Other Ambulatory Visit (HOSPITAL_COMMUNITY): Payer: Self-pay | Admitting: Oncology

## 2011-06-13 ENCOUNTER — Inpatient Hospital Stay (HOSPITAL_COMMUNITY): Payer: Medicaid Other

## 2011-06-13 DIAGNOSIS — C786 Secondary malignant neoplasm of retroperitoneum and peritoneum: Secondary | ICD-10-CM

## 2011-06-13 DIAGNOSIS — C7982 Secondary malignant neoplasm of genital organs: Secondary | ICD-10-CM

## 2011-06-13 DIAGNOSIS — C189 Malignant neoplasm of colon, unspecified: Secondary | ICD-10-CM

## 2011-06-13 LAB — CBC
MCH: 30.2 pg (ref 26.0–34.0)
Platelets: 160 10*3/uL (ref 150–400)
RBC: 3.84 MIL/uL — ABNORMAL LOW (ref 3.87–5.11)
WBC: 4.2 10*3/uL (ref 4.0–10.5)

## 2011-06-13 LAB — DIFFERENTIAL
Basophils Relative: 0 % (ref 0–1)
Eosinophils Absolute: 0 10*3/uL (ref 0.0–0.7)
Lymphs Abs: 1.8 10*3/uL (ref 0.7–4.0)
Neutrophils Relative %: 45 % (ref 43–77)

## 2011-06-13 LAB — POTASSIUM: Potassium: 3.8 mEq/L (ref 3.5–5.1)

## 2011-06-13 NOTE — Progress Notes (Signed)
Labs drawn today for cbc/diff,k

## 2011-06-14 ENCOUNTER — Encounter (HOSPITAL_BASED_OUTPATIENT_CLINIC_OR_DEPARTMENT_OTHER): Payer: Medicaid Other

## 2011-06-14 VITALS — BP 155/106 | HR 88 | Temp 97.8°F | Ht 60.0 in | Wt 185.4 lb

## 2011-06-14 DIAGNOSIS — Z5111 Encounter for antineoplastic chemotherapy: Secondary | ICD-10-CM

## 2011-06-14 DIAGNOSIS — C786 Secondary malignant neoplasm of retroperitoneum and peritoneum: Secondary | ICD-10-CM

## 2011-06-14 DIAGNOSIS — C792 Secondary malignant neoplasm of skin: Secondary | ICD-10-CM

## 2011-06-14 DIAGNOSIS — C189 Malignant neoplasm of colon, unspecified: Secondary | ICD-10-CM

## 2011-06-14 LAB — URINALYSIS, DIPSTICK ONLY
Glucose, UA: >1000 mg/dL — AB
Specific Gravity, Urine: 1.01 (ref 1.005–1.030)

## 2011-06-14 MED ORDER — LEUCOVORIN CALCIUM INJECTION 100 MG
40.0000 mg | Freq: Once | INTRAMUSCULAR | Status: AC
  Administered 2011-06-14: 40 mg via INTRAVENOUS
  Filled 2011-06-14: qty 2

## 2011-06-14 MED ORDER — OXALIPLATIN CHEMO INJECTION 100 MG/20ML
85.0000 mg/m2 | Freq: Once | INTRAVENOUS | Status: AC
  Administered 2011-06-14: 160 mg via INTRAVENOUS
  Filled 2011-06-14: qty 32

## 2011-06-14 MED ORDER — LORAZEPAM 2 MG/ML IJ SOLN
1.0000 mg | Freq: Once | INTRAMUSCULAR | Status: AC
  Administered 2011-06-14: 11:00:00 via INTRAVENOUS

## 2011-06-14 MED ORDER — SODIUM CHLORIDE 0.9 % IV SOLN
5.0000 mg/kg | Freq: Once | INTRAVENOUS | Status: AC
  Administered 2011-06-14: 425 mg via INTRAVENOUS
  Filled 2011-06-14: qty 17

## 2011-06-14 MED ORDER — DEXTROSE 5 % IV SOLN
INTRAVENOUS | Status: AC
  Filled 2011-06-14 (×2): qty 50

## 2011-06-14 MED ORDER — LORAZEPAM 2 MG/ML IJ SOLN
INTRAMUSCULAR | Status: AC
  Filled 2011-06-14: qty 1

## 2011-06-14 MED ORDER — DEXAMETHASONE SODIUM PHOSPHATE 10 MG/ML IJ SOLN: 8.0000 mg | Freq: Once | INTRAMUSCULAR | Status: DC

## 2011-06-14 MED ORDER — SODIUM CHLORIDE 0.9 % IV SOLN
Freq: Once | INTRAVENOUS | Status: AC
  Administered 2011-06-14: 24 mg via INTRAVENOUS
  Filled 2011-06-14: qty 12

## 2011-06-14 MED ORDER — ONDANSETRON 8 MG/50ML IVPB (CHCC): 24.0000 mg | Freq: Once | INTRAVENOUS | Status: DC

## 2011-06-14 MED ORDER — FLUOROURACIL CHEMO INJECTION 2.5 GM/50ML
400.0000 mg/m2 | Freq: Once | INTRAVENOUS | Status: AC
  Administered 2011-06-14: 750 mg via INTRAVENOUS
  Filled 2011-06-14: qty 15

## 2011-06-14 MED ORDER — HEPARIN SOD (PORK) LOCK FLUSH 100 UNIT/ML IV SOLN
500.0000 [IU] | Freq: Once | INTRAVENOUS | Status: DC | PRN
  Filled 2011-06-14: qty 5

## 2011-06-14 MED ORDER — SODIUM CHLORIDE 0.9 % IV SOLN
2400.0000 mg/m2 | INTRAVENOUS | Status: DC
  Administered 2011-06-14: 4600 mg via INTRAVENOUS
  Filled 2011-06-14 (×2): qty 92

## 2011-06-14 MED ORDER — SODIUM CHLORIDE 0.9 % IJ SOLN
10.0000 mL | INTRAMUSCULAR | Status: DC | PRN
  Filled 2011-06-14: qty 10

## 2011-06-14 NOTE — Progress Notes (Signed)
Infusion complete, patient tolerated well.   

## 2011-06-16 ENCOUNTER — Encounter (HOSPITAL_BASED_OUTPATIENT_CLINIC_OR_DEPARTMENT_OTHER): Payer: Medicaid Other

## 2011-06-16 DIAGNOSIS — C189 Malignant neoplasm of colon, unspecified: Secondary | ICD-10-CM

## 2011-06-16 DIAGNOSIS — C786 Secondary malignant neoplasm of retroperitoneum and peritoneum: Secondary | ICD-10-CM

## 2011-06-16 DIAGNOSIS — C792 Secondary malignant neoplasm of skin: Secondary | ICD-10-CM

## 2011-06-16 MED ORDER — SODIUM CHLORIDE 0.9 % IJ SOLN
INTRAMUSCULAR | Status: AC
  Administered 2011-06-16: 14:00:00
  Filled 2011-06-16: qty 10

## 2011-06-16 MED ORDER — HEPARIN SOD (PORK) LOCK FLUSH 100 UNIT/ML IV SOLN
INTRAVENOUS | Status: AC
  Administered 2011-06-16: 500 [IU]
  Filled 2011-06-16: qty 5

## 2011-06-16 NOTE — Progress Notes (Signed)
Yolanda Friedman presented for Portacath access and flush. Proper placement of portacath confirmed by CXR. Portacath located rt chest wall accessed with  H 20 needle. Good blood return present. Portacath flushed with 20ml NS and 500U/5ml Heparin and needle removed intact. Procedure without incident. Patient tolerated procedure well.   

## 2011-06-23 ENCOUNTER — Encounter (HOSPITAL_COMMUNITY): Payer: Self-pay

## 2011-06-26 ENCOUNTER — Encounter (HOSPITAL_BASED_OUTPATIENT_CLINIC_OR_DEPARTMENT_OTHER): Payer: Medicaid Other

## 2011-06-26 DIAGNOSIS — C189 Malignant neoplasm of colon, unspecified: Secondary | ICD-10-CM

## 2011-06-26 LAB — COMPREHENSIVE METABOLIC PANEL
ALT: 17 U/L (ref 0–35)
AST: 24 U/L (ref 0–37)
Calcium: 9.8 mg/dL (ref 8.4–10.5)
GFR calc Af Amer: >90 mL/min (ref >90)
Sodium: 139 mEq/L (ref 135–145)
Total Protein: 7 g/dL (ref 6.0–8.3)

## 2011-06-26 LAB — DIFFERENTIAL
Basophils Absolute: 0 10*3/uL (ref 0.0–0.1)
Basophils Relative: 0 % (ref 0–1)
Eosinophils Absolute: 0 10*3/uL (ref 0.0–0.7)
Neutrophils Relative %: 52 % (ref 43–77)

## 2011-06-26 LAB — CEA: CEA: 3.7 ng/mL (ref 0.0–5.0)

## 2011-06-26 LAB — CBC
MCH: 30.7 pg (ref 26.0–34.0)
MCHC: 33 g/dL (ref 30.0–36.0)
Platelets: 147 10*3/uL — ABNORMAL LOW (ref 150–400)

## 2011-06-26 NOTE — Progress Notes (Signed)
Yolanda Friedman presented for labwork. Labs per MD order drawn via peripheral stick rt ac. Labs drawn for cbc diff,cmet and cea. Procedure without incident.  Patient tolerated procedure well.

## 2011-06-27 ENCOUNTER — Encounter (HOSPITAL_BASED_OUTPATIENT_CLINIC_OR_DEPARTMENT_OTHER): Payer: Medicaid Other

## 2011-06-27 VITALS — BP 164/106 | HR 101 | Temp 97.7°F | Ht 60.0 in | Wt 187.4 lb

## 2011-06-27 DIAGNOSIS — Z5112 Encounter for antineoplastic immunotherapy: Secondary | ICD-10-CM

## 2011-06-27 DIAGNOSIS — C792 Secondary malignant neoplasm of skin: Secondary | ICD-10-CM

## 2011-06-27 DIAGNOSIS — C189 Malignant neoplasm of colon, unspecified: Secondary | ICD-10-CM

## 2011-06-27 DIAGNOSIS — Z5111 Encounter for antineoplastic chemotherapy: Secondary | ICD-10-CM

## 2011-06-27 DIAGNOSIS — C787 Secondary malignant neoplasm of liver and intrahepatic bile duct: Secondary | ICD-10-CM

## 2011-06-27 LAB — URINALYSIS, DIPSTICK ONLY
Ketones, ur: NEGATIVE mg/dL
Leukocytes, UA: NEGATIVE
Nitrite: NEGATIVE
Protein, ur: NEGATIVE mg/dL
pH: 5.5 (ref 5.0–8.0)

## 2011-06-27 MED ORDER — DEXAMETHASONE SODIUM PHOSPHATE 10 MG/ML IJ SOLN: 8.0000 mg | Freq: Once | INTRAMUSCULAR | Status: DC

## 2011-06-27 MED ORDER — DEXTROSE 5 % IV SOLN
INTRAVENOUS | Status: AC
  Administered 2011-06-27: 50 mL via INTRAVENOUS
  Filled 2011-06-27 (×2): qty 50

## 2011-06-27 MED ORDER — LORAZEPAM 2 MG/ML IJ SOLN
1.0000 mg | Freq: Once | INTRAMUSCULAR | Status: AC
  Administered 2011-06-27: 1 mg via INTRAVENOUS

## 2011-06-27 MED ORDER — SODIUM CHLORIDE 0.9 % IJ SOLN
10.0000 mL | INTRAMUSCULAR | Status: DC | PRN
  Filled 2011-06-27: qty 10

## 2011-06-27 MED ORDER — SODIUM CHLORIDE 0.9 % IV SOLN
5.0000 mg/kg | Freq: Once | INTRAVENOUS | Status: AC
  Administered 2011-06-27: 425 mg via INTRAVENOUS
  Filled 2011-06-27: qty 17

## 2011-06-27 MED ORDER — ONDANSETRON 8 MG/50ML IVPB (CHCC): 24.0000 mg | Freq: Once | INTRAVENOUS | Status: DC

## 2011-06-27 MED ORDER — LEUCOVORIN CALCIUM INJECTION 100 MG
40.0000 mg | Freq: Once | INTRAMUSCULAR | Status: AC
  Administered 2011-06-27: 40 mg via INTRAVENOUS
  Filled 2011-06-27: qty 2

## 2011-06-27 MED ORDER — SODIUM CHLORIDE 0.9 % IJ SOLN
INTRAMUSCULAR | Status: AC
  Filled 2011-06-27: qty 10

## 2011-06-27 MED ORDER — FLUOROURACIL CHEMO INJECTION 2.5 GM/50ML
400.0000 mg/m2 | Freq: Once | INTRAVENOUS | Status: AC
  Administered 2011-06-27: 750 mg via INTRAVENOUS
  Filled 2011-06-27: qty 15

## 2011-06-27 MED ORDER — SODIUM CHLORIDE 0.9 % IV SOLN
Freq: Once | INTRAVENOUS | Status: AC
  Administered 2011-06-27: 09:00:00 via INTRAVENOUS

## 2011-06-27 MED ORDER — LORAZEPAM 2 MG/ML IJ SOLN
INTRAMUSCULAR | Status: AC
  Administered 2011-06-27: 1 mg via INTRAVENOUS
  Filled 2011-06-27: qty 1

## 2011-06-27 MED ORDER — SODIUM CHLORIDE 0.9 % IV SOLN
Freq: Once | INTRAVENOUS | Status: AC
  Administered 2011-06-27: 24 mg via INTRAVENOUS
  Filled 2011-06-27: qty 12

## 2011-06-27 MED ORDER — HEPARIN SOD (PORK) LOCK FLUSH 100 UNIT/ML IV SOLN
500.0000 [IU] | Freq: Once | INTRAVENOUS | Status: DC | PRN
  Filled 2011-06-27: qty 5

## 2011-06-27 MED ORDER — OXALIPLATIN CHEMO INJECTION 100 MG/20ML
85.0000 mg/m2 | Freq: Once | INTRAVENOUS | Status: AC
  Administered 2011-06-27: 160 mg via INTRAVENOUS
  Filled 2011-06-27: qty 32

## 2011-06-27 MED ORDER — SODIUM CHLORIDE 0.9 % IV SOLN
2400.0000 mg/m2 | INTRAVENOUS | Status: DC
  Administered 2011-06-27: 4600 mg via INTRAVENOUS
  Filled 2011-06-27 (×2): qty 92

## 2011-06-27 NOTE — Progress Notes (Signed)
Pt tolerated chemo well. BP elevated, pt instructed to check at home and continue bp meds as prescribed.

## 2011-06-28 ENCOUNTER — Telehealth (HOSPITAL_COMMUNITY): Payer: Self-pay

## 2011-06-28 ENCOUNTER — Ambulatory Visit (HOSPITAL_COMMUNITY): Payer: Medicaid Other | Admitting: Oncology

## 2011-06-28 NOTE — Telephone Encounter (Signed)
Will be back on Thursday to see PA and get pump d/c past chemotherapy.  States" I'm doing ok except when my hands get cold.  I'll be glad when I can drink an ice cold soda.  It's one of the things that I really miss."

## 2011-06-29 ENCOUNTER — Encounter (HOSPITAL_COMMUNITY): Payer: Medicaid Other

## 2011-06-29 ENCOUNTER — Encounter (HOSPITAL_BASED_OUTPATIENT_CLINIC_OR_DEPARTMENT_OTHER): Payer: Medicaid Other | Admitting: Oncology

## 2011-06-29 VITALS — BP 142/97 | HR 101 | Temp 98.4°F | Wt 184.4 lb

## 2011-06-29 DIAGNOSIS — C189 Malignant neoplasm of colon, unspecified: Secondary | ICD-10-CM

## 2011-06-29 MED ORDER — HYDROCODONE-ACETAMINOPHEN 7.5-500 MG PO TABS: 1.0000 | ORAL_TABLET | Freq: Four times a day (QID) | ORAL | Status: DC | PRN

## 2011-06-29 MED ORDER — HEPARIN SOD (PORK) LOCK FLUSH 100 UNIT/ML IV SOLN
INTRAVENOUS | Status: AC
  Administered 2011-06-29: 500 [IU]
  Filled 2011-06-29: qty 5

## 2011-06-29 MED ORDER — SODIUM CHLORIDE 0.9 % IJ SOLN
INTRAMUSCULAR | Status: AC
  Administered 2011-06-29: 14:00:00
  Filled 2011-06-29: qty 10

## 2011-06-29 NOTE — Progress Notes (Signed)
Miaa J Morella presented for Portacath access and flush. Proper placement of portacath confirmed by CXR. Portacath located rt chest wall accessed with  H 20 needle. Good blood return present. Portacath flushed with 20ml NS and 500U/5ml Heparin and needle removed intact. Procedure without incident. Patient tolerated procedure well.   

## 2011-06-29 NOTE — Progress Notes (Signed)
No primary provider on file. No primary provider on file.  1. Metastatic recurrent adenocarcinoma of colon     CURRENT THERAPY: S/P 12 cycles of FOLFOX today  INTERVAL HISTORY: Yolanda Friedman 60 y.o. female returns for  regular  visit for followup of recurrent metastatic adenocarcinoma of colon.  The patient reports some knee discomfort which is worse in the right compared to the left.  This discomfort does not prevent her from ambulating.  She rates it a 6/10 on the pain scale.  She describes it has intermittent worsening of discomfort.  She illustrates the pain by creating a fist with her hand and then slowly opening her hand and then repeating.  The patient does admit to some mild peripheral neuropathy but denies any complaints with buttoning a button or dropping objects.  This numbness does not affect her ADLs nor her IADLs.     She admits to loose stools, but denies any blood, mucous, or black stools.  She denies any urinary complaints.  She denies any fevers, chills, night sweats.    She reports that In December she will undergo the belly wash procedure at [facility].  Past Medical History  Diagnosis Date  . Cancer   . Diabetes mellitus     no oral or insulin  . Hypertension   . Colon cancer 2005    metastatic 2012  . Metastatic recurrent adenocarcinoma of colon 03/13/2011    has Metastatic recurrent adenocarcinoma of colon on her problem list.      has no known allergies.  Ms. Mitnick does not currently have medications on file.  Past Surgical History  Procedure Date  . Cholecystectomy 1987  . Colectomy 2005    colon ca  . Abdominal hysterectomy 12/20/2010    met colon ca  . Portacath placement 01/2011    Denies any headaches, dizziness, double vision, fevers, chills, night sweats, nausea, vomiting, diarrhea, constipation, chest pain, heart palpitations, shortness of breath, blood in stool, black tarry stool, urinary pain, urinary burning, urinary frequency, hematuria.   PHYSICAL EXAMINATION  ECOG PERFORMANCE STATUS: 0 - Asymptomatic  Filed Vitals:   06/29/11 1332  BP: 142/97  Pulse: 101  Temp: 98.4 F (36.9 C)    GENERAL:alert, no distress, well nourished, well developed, comfortable, cooperative and smiling SKIN: skin color, texture, turgor are normal HEAD: Normocephalic EYES: normal EARS: External ears normal OROPHARYNX:mucous membranes are moist  NECK: trachea midline LYMPH:  not examined BREAST:not examined LUNGS: clear to auscultation  HEART: regular rate & rhythm, no murmurs, no gallops, S1 normal and S2 normal ABDOMEN:abdomen soft, non-tender and normal bowel sounds BACK: Back symmetric, no curvature. EXTREMITIES:less then 2 second capillary refill, no joint deformities, effusion, or inflammation, no edema, no skin discoloration, no clubbing, no cyanosis  NEURO: alert & oriented x 3 with fluent speech, no focal motor/sensory deficits, gait normal  LABORATORY DATA: CBC    Component Value Date/Time   WBC 4.2 06/26/2011 0929   RBC 3.71* 06/26/2011 0929   HGB 11.4* 06/26/2011 0929   HCT 34.5* 06/26/2011 0929   PLT 147* 06/26/2011 0929   MCV 93.0 06/26/2011 0929   MCH 30.7 06/26/2011 0929   MCHC 33.0 06/26/2011 0929   RDW 18.4* 06/26/2011 0929   LYMPHSABS 1.5 06/26/2011 0929   MONOABS 0.5 06/26/2011 0929   EOSABS 0.0 06/26/2011 0929   BASOSABS 0.0 06/26/2011 0929      Chemistry      Component Value Date/Time   NA 139 06/26/2011 0929   K  3.5 06/26/2011 0929   CL 104 06/26/2011 0929   CO2 23 06/26/2011 0929   BUN 12 06/26/2011 0929   CREATININE 0.65 06/26/2011 0929      Component Value Date/Time   CALCIUM 9.8 06/26/2011 0929   ALKPHOS 69 06/26/2011 0929   AST 24 06/26/2011 0929   ALT 17 06/26/2011 0929   BILITOT 0.3 06/26/2011 0929        ASSESSMENT:  1. Recurrent metastatic adenocarcinoma of colon, now S/P 12 cycles of FOLFOX 2. Peripheral neuropathy, grade 1   PLAN:  1. Return as scheduled. 2. Disconnect  5-FU continuous infusion pump.   All questions were answered. The patient knows to call the clinic with any problems, questions or concerns. We can certainly see the patient much sooner if necessary.   Minh Jasper

## 2011-06-29 NOTE — Progress Notes (Signed)
Gwendy J Schult presented for Portacath access and flush. Proper placement of portacath confirmed by CXR. Portacath located rt chest wall accessed with  H 20 needle. Good blood return present. Portacath flushed with 20ml NS and 500U/5ml Heparin and needle removed intact. Procedure without incident. Patient tolerated procedure well.   

## 2011-07-10 NOTE — Progress Notes (Signed)
CC:   Yolanda Royals, MD De Blanch, M.D. Tilda Burrow, M.D.  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  She had omental involvement as well.  The final pathology of course was adenocarcinoma consistent with a colon primary.  So she essentially had all obvious tumor debulked by Dr. Stanford Breed.  She was referred to [place] for chemotherapy since this was recurrent metastatic disease.  She has been treated thus far with 10 of a planned 12 doses of FOLFOX plus Avastin.  She has her right distal index finger numb one-third of the way from the distal end.  She has a cold sensitivity of her mouth and skin.  She has increased pigmentation of her face, hands and feet which is no surprise and she has little sensitivity of her left ear ever since she has had a viral infection 2-3 weeks ago.  I tried to explain that, but both my PA Dellis Anes and myself have both looked in her ears and they are unremarkable to observation.  Her tympanic membranes and canals etc. are fine.  When she stuffs her left ear with cotton she can sleep very easily at night.  Whether this is related to oxaliplatin is unclear.  She otherwise is tolerating therapy well except for some fatigue and weakness.  Her blood work showed last week that she had a potassium 2.9. She is on a b.i.d. dose of potassium now, but she did not bring the pills that we called in for her.  We will of course check a potassium next time she is here.  She is accompanied by her daughter-in-law today.  She has otherwise a negative oncologic review of systems.  Vital signs are stable.  Weight 184 pounds and that is actually down from 194 pounds when we started therapy.  Other vital signs show blood pressure 169/90, that is slightly up compared we started therapy.  She is somewhat anxious today, however.  Pulse right around 86 and regular, respirations 18-20 and unlabored.   She is afebrile.  Her body surface area is calculated at 1.88 m2.  She has no lymphadenopathy in cervical, supraclavicular, infraclavicular, axillary or inguinal nodes.  Her lungs are clear to auscultation and percussion.  Heart shows a regular rhythm and rate without obvious murmur, rub or gallop.  The right sided Port-A-Cath is intact in the upper chest.  Her abdomen is soft, nontender without hepatosplenomegaly or masses.  There is no obvious ascites.  She has no peripheral edema.  Her hands are just discolored and hyperpigmented really.  She looks really quite good.  Her last blood work otherwise showed a normal CEA level, very adequate CBC and differential and normal liver enzymes, so we will continue with therapy.  She is already set up to see Dr. Roseanne Reno in November with surgery planned for early December, 2012.  I think with this intra-abdominal, ovarian and uterine recurrences only that we have documented so far she does present herself as a good candidate for this intraperitoneal heated chemotherapy procedure.  Dr. Roseanne Reno I think is very optimistic for her chances of responding.  So we will see her back in a month.  I will get Jenita Seashore my PA to see her then just to make sure that there are no further issues before she goes for her definitive workup.    ______________________________ Ladona Horns. Mariel Sleet, MD ESN/MEDQ  D:  06/06/2011  T:  06/06/2011  Job:  [id number]

## 2011-07-11 ENCOUNTER — Encounter (HOSPITAL_COMMUNITY): Payer: PRIVATE HEALTH INSURANCE | Attending: Oncology | Admitting: Oncology

## 2011-07-11 VITALS — BP 159/86 | HR 90 | Temp 97.9°F | Wt 184.6 lb

## 2011-07-11 DIAGNOSIS — C189 Malignant neoplasm of colon, unspecified: Secondary | ICD-10-CM | POA: Insufficient documentation

## 2011-07-11 NOTE — Patient Instructions (Signed)
[facility]  Discharge Instructions  RECOMMENDATIONS MADE BY THE CONSULTANT AND ANY TEST RESULTS WILL BE SENT TO YOUR REFERRING DOCTOR.   EXAM FINDINGS BY MD TODAY AND SIGNS AND SYMPTOMS TO REPORT TO CLINIC OR PRIMARY MD: you look great.  Report abdominal pain, swelling in abdomen and changes in bowel habits.  MEDICATIONS PRESCRIBED: none     SPECIAL INSTRUCTIONS/FOLLOW-UP: Return to Clinic: end of January after procedure.   I acknowledge that I have been informed and understand all the instructions given to me and received a copy. I do not have any more questions at this time, but understand that I may call the Specialty Clinic at [facility] at [phone] during business hours should I have any further questions or need assistance in obtaining follow-up care.    __________________________________________  _____________  __________ Signature of Patient or Authorized Representative            Date                   Time    __________________________________________ Nurse's Signature

## 2011-07-11 NOTE — Progress Notes (Signed)
No primary provider on file. No primary provider on file.  1. Metastatic recurrent adenocarcinoma of colon     CURRENT THERAPY:S/P 12 cycles of FOLFOX today   INTERVAL HISTORY: Yolanda Friedman 60 y.o. female returns for  regular  visit for followup of recurrent metastatic adenocarcinoma of colon.   According to the patient, she is scheduled to go to [facility] for testing on 07/19/2011 for the "Belly wash procedure."  The patient explains that she is anticipating the procedure being performed at the beginning of December.   The patient was surprised with a small celebration today for completing her chemotherapy.  The patient reports burping following consuming liquid and food products.  This may be related to Acid Reflux for which she takes Omeprazole.  I offered her a GI consult at this time and she has declined.  She also reports some nerve "pain" when she puts her cranium in a flexed position.  She noted the discomfort all the way down to her legs.  She notes it more on the right side.  Again she reports that this discomfort is manageable.  I think after her upcoming procedure, we can investigate this symptom further if it does not resolve on its own with an MRI.  She denies any trauma.  This too could be muscle related.  We will continue to monitor this symptom and treat conservatively for now.  The patient also complains of a dry mouth which has been an ongoing issue for her and may be medication related.  Past Medical History  Diagnosis Date  . Cancer   . Diabetes mellitus     no oral or insulin  . Hypertension   . Colon cancer 2005    metastatic 2012  . Metastatic recurrent adenocarcinoma of colon 03/13/2011    has Metastatic recurrent adenocarcinoma of colon on her problem list.      has no known allergies.  Ms. Crandell had no medications administered during this visit.  Past Surgical History  Procedure Date  . Cholecystectomy 1987  . Colectomy 2005   colon ca  . Abdominal hysterectomy 12/20/2010    met colon ca  . Portacath placement 01/2011    Denies any headaches, dizziness, double vision, fevers, chills, night sweats, nausea, vomiting, diarrhea, constipation, chest pain, heart palpitations, shortness of breath, blood in stool, black tarry stool, urinary pain, urinary burning, urinary frequency, hematuria.   PHYSICAL EXAMINATION  ECOG PERFORMANCE STATUS: 0 - Asymptomatic  Filed Vitals:   07/11/11 1139  BP: 159/86  Pulse: 90  Temp: 97.9 F (36.6 C)    GENERAL:alert, no distress, well nourished, well developed, comfortable, cooperative and smiling SKIN: skin color, texture, turgor are normal HEAD: Normocephalic EYES: normal EARS: External ears normal OROPHARYNX:mucous membranes are moist  NECK: supple, trachea midline LYMPH:  no palpable lymphadenopathy BREAST:not examined LUNGS: clear to auscultation and percussion HEART: regular rate & rhythm, no murmurs, no gallops, S1 normal and S2 normal ABDOMEN:abdomen soft, non-tender, obese and normal bowel sounds BACK: Back symmetric, no curvature. EXTREMITIES:less then 2 second capillary refill, no joint deformities, effusion, or inflammation, no edema, no skin discoloration, no clubbing, no cyanosis  NEURO: alert & oriented x 3 with fluent speech, no focal motor/sensory deficits, gait normal     ASSESSMENT:  1. Recurrent metastatic adenocarcinoma of colon, now S/P 12 cycles of FOLFOX  2. Peripheral neuropathy, grade 1 3. GERD 4. Neuropathy with flexion of cranium 5. Dry Mouth   PLAN:  1. Appointment at [facility]  on 07/19/2011 2. Anticipation Belly wash procedure at [facility] at the beginning of December 2012 3. Return at End of January 2013 for follow-up 4. Patient declined GI consult 5. Patient wishes to treat neuropathy conservatively with observation until procedure at [facility] is complete.   All questions were answered. The patient knows to call the clinic with any  problems, questions or concerns. We can certainly see the patient much sooner if necessary.  The patient and plan discussed with Glenford Peers, MD and he is in agreement with the aforementioned.  I spent 20 minutes counseling the patient face to face. The total time spent in the appointment was 25 minutes.  Marillyn Goren

## 2011-07-12 ENCOUNTER — Encounter (HOSPITAL_BASED_OUTPATIENT_CLINIC_OR_DEPARTMENT_OTHER): Payer: PRIVATE HEALTH INSURANCE

## 2011-07-12 DIAGNOSIS — C189 Malignant neoplasm of colon, unspecified: Secondary | ICD-10-CM

## 2011-07-12 DIAGNOSIS — Z23 Encounter for immunization: Secondary | ICD-10-CM

## 2011-07-12 MED ORDER — INFLUENZA VIRUS VACC SPLIT PF IM SUSP
0.5000 mL | Freq: Once | INTRAMUSCULAR | Status: AC
  Administered 2011-07-12: 0.5 mL via INTRAMUSCULAR

## 2011-07-12 MED ORDER — INFLUENZA VIRUS VACC SPLIT PF IM SUSP
INTRAMUSCULAR | Status: AC
  Filled 2011-07-12: qty 0.5

## 2011-07-12 NOTE — Progress Notes (Signed)
Yolanda Friedman presents today for injection per MD orders. Flu vaccine 0.5 ml  Administered M in right Upper Arm. Administration without incident. Patient tolerated well.

## 2011-08-02 ENCOUNTER — Telehealth (HOSPITAL_COMMUNITY): Payer: Self-pay | Admitting: *Deleted

## 2011-08-03 ENCOUNTER — Encounter (HOSPITAL_COMMUNITY): Payer: Medicaid Other

## 2011-08-17 ENCOUNTER — Telehealth (HOSPITAL_COMMUNITY): Payer: Self-pay | Admitting: *Deleted

## 2011-08-17 NOTE — Telephone Encounter (Signed)
Patient will be on Tom's schedule for 12/19 at 12. She said that Dr. Roseanne Reno @ [facility] said she will need to restart chemo. She really wants to see you that day. She has a tumor sitting on her liver that is too large for surgery. She said that the MD said you couldn't see it on a scan because of the way it was sitting. Patient wants a break before starting chemo. She would really like to wait until after January if there is no harm to her health. Patient having a lot of crazy feelings in her legs and feet/hands numb.

## 2011-08-23 ENCOUNTER — Encounter (HOSPITAL_COMMUNITY): Payer: PRIVATE HEALTH INSURANCE | Attending: Oncology | Admitting: Oncology

## 2011-08-23 VITALS — BP 164/100 | HR 109 | Temp 98.0°F | Wt 183.9 lb

## 2011-08-23 DIAGNOSIS — K219 Gastro-esophageal reflux disease without esophagitis: Secondary | ICD-10-CM

## 2011-08-23 DIAGNOSIS — C787 Secondary malignant neoplasm of liver and intrahepatic bile duct: Secondary | ICD-10-CM

## 2011-08-23 DIAGNOSIS — G589 Mononeuropathy, unspecified: Secondary | ICD-10-CM

## 2011-08-23 DIAGNOSIS — C189 Malignant neoplasm of colon, unspecified: Secondary | ICD-10-CM | POA: Insufficient documentation

## 2011-08-23 NOTE — Patient Instructions (Signed)
[facility]  Discharge Instructions  RECOMMENDATIONS MADE BY THE CONSULTANT AND ANY TEST RESULTS WILL BE SENT TO YOUR REFERRING DOCTOR.   EXAM FINDINGS BY MD TODAY AND SIGNS AND SYMPTOMS TO REPORT TO CLINIC OR PRIMARY MD: We will plan to restart chemotherapy in a few weeks after the holidays. We need to get records from [place]. We may need to schedule other scans before starting chemo again.     I acknowledge that I have been informed and understand all the instructions given to me and received a copy. I do not have any more questions at this time, but understand that I may call the Specialty Clinic at [facility] at [phone] during business hours should I have any further questions or need assistance in obtaining follow-up care.    __________________________________________  _____________  __________ Signature of Patient or Authorized Representative            Date                   Time    __________________________________________ Nurse's Signature

## 2011-08-23 NOTE — Progress Notes (Signed)
No primary provider on file. No primary provider on file.  1. Metastatic recurrent adenocarcinoma of colon     CURRENT THERAPY:S/P 12 cycles of FOLFOX followed by a belly wash at [facility] on 08/08/11, which revealed a lesion on the dome of the liver unidentified on radiographic studies.    INTERVAL HISTORY: Yolanda Friedman 60 y.o. female returns for  regular  visit for followup of metastatic recurrent colon cancer.   The patient underwent a belly wash procedure at [facility].  It was discovered during the intervention that a lesion was identified on the dome of the liver.  That procedure was performed on 08/08/11  The patient is here to get her staples removed from the procedure and to discuss chemotherapy.    The patient makes it clear that she is disappointed and frustrated that all therapy performed thus far has not "gotten rid of the cancer."    She other wise feels well.  She denies any fevers, chills, night sweats.  She denies any milky or infected discharge from her abdominal wound.  She denies any nausea and vomiting.  She requests to hold chemotherapy until after the holidays.    The patient does report that she is moving to [place], [place] in Feb 2013.  We should keep this in mind and help coordinate transfer of her care when that time comes.     Past Medical History  Diagnosis Date  . Cancer   . Diabetes mellitus     no oral or insulin  . Hypertension   . Colon cancer 2005    metastatic 2012  . Metastatic recurrent adenocarcinoma of colon 03/13/2011    has Metastatic recurrent adenocarcinoma of colon on her problem list.      has no known allergies.  Ms. Iott does not currently have medications on file.  Past Surgical History  Procedure Date  . Cholecystectomy 1987  . Colectomy 2005    colon ca  . Abdominal hysterectomy 12/20/2010    met colon ca  . Portacath placement 01/2011    Denies any headaches, dizziness, double vision, fevers, chills, night sweats, nausea,  vomiting, diarrhea, constipation, chest pain, heart palpitations, shortness of breath, blood in stool, black tarry stool, urinary pain, urinary burning, urinary frequency, hematuria.   PHYSICAL EXAMINATION  ECOG PERFORMANCE STATUS: 1 - Symptomatic but completely ambulatory  Filed Vitals:   08/23/11 1224  BP: 164/100  Pulse: 109  Temp: 98 F (36.7 C)    GENERAL:alert, no distress, well nourished, well developed, comfortable, cooperative, crying and obese SKIN: skin color, texture, turgor are normal HEAD: Normocephalic EYES: normal EARS: External ears normal OROPHARYNX:mucous membranes are moist  NECK: supple, trachea midline LYMPH:  no palpable lymphadenopathy BREAST:not examined LUNGS: clear to auscultation  HEART: regular rate & rhythm, no murmurs, no gallops, S1 normal and S2 normal ABDOMEN:abdomen soft, obese, normal bowel sounds and midline incision with staples in place.  No erythema or heat.  Healing nicely without any signs of infection. BACK: Back symmetric, no curvature., No CVA tenderness EXTREMITIES:less then 2 second capillary refill, no joint deformities, effusion, or inflammation, no edema, no skin discoloration, no clubbing, no cyanosis  NEURO: alert & oriented x 3 with fluent speech, no focal motor/sensory deficits, gait normal   LABORATORY DATA: CBC    Component Value Date/Time   WBC 4.2 06/26/2011 0929   RBC 3.71* 06/26/2011 0929   HGB 11.4* 06/26/2011 0929   HCT 34.5* 06/26/2011 0929   PLT 147* 06/26/2011 1610  MCV 93.0 06/26/2011 0929   MCH 30.7 06/26/2011 0929   MCHC 33.0 06/26/2011 0929   RDW 18.4* 06/26/2011 0929   LYMPHSABS 1.5 06/26/2011 0929   MONOABS 0.5 06/26/2011 0929   EOSABS 0.0 06/26/2011 0929   BASOSABS 0.0 06/26/2011 0929     RADIOGRAPHIC STUDIES:  01/12/11  *RADIOLOGY REPORT*  Clinical Data: Subsequent treatment strategy for colon cancer.  Original diagnosis in 2005 for which the patient underwent right  hemicolectomy and  chemotherapy. Recent imaging demonstrated  bilateral ovarian cystic masses. Subsequent hysterectomy and  bilateral oophorectomy with the pathology of the ovarian masses  consistent with metastatic mucinous adenocarcinoma of the colon.  NUCLEAR MEDICINE PET CT RESTAGING (PS) SKULL BASE TO THIGH  01/12/2011:  Technique: 17.7 mCi F-18 FDG was injected intravenously via the  right forearm vein. Full-ring PET imaging was performed from the  skull base through the mid-thighs 71 minutes after injection. CT  data was obtained and used for attenuation correction and anatomic  localization only. (This was not acquired as a diagnostic CT  examination.)  Fasting Blood Glucose: 96  Patient Weight: 193 pounds.  Comparison: No prior PET CT. Diagnostic CT abdomen and pelvis  11/17/2010.  Findings: No abnormal hypermetabolic activity within the neck,  chest, abdomen or pelvis to suggest recurrent or metastatic colon  cancer. Hypermetabolic activity within the midline surgical  incision, consistent with granulation tissue after recent surgery.  No abnormal osseous activity.  Activity posteriorly in the larynx consistent with vocalis muscle  activity during phonation. Physiologic activity involving the left  ventricular myocardium. Physiologic activity involving the luminal  contents of the large and small bowel. Physiologic excretion into  the urinary tract.  Unenhanced CT images demonstrate diffuse atherosclerosis, scarring  in the lower lobes of both lungs, extensive three-vessel coronary  calcification, patent ileocolic anastomosis to the left of midline  at the umbilicus, and the midline surgical incision in the pelvis.  IMPRESSION:  No evidence of hypermetabolic recurrent or metastatic disease in  the neck, chest, abdomen or pelvis.  Original Report Authenticated By: Arnell Sieving, M.D.   PATHOLOGY: 12/06/10 FINAL DIAGNOSIS Diagnosis 1. Omentum, biopsy - INVOLVEMENT BY ADENOCARCINOMA  WITH MUCINOUS FEATURES 2. Ovary and fallopian tube, left - OVARY WITH INVOLVEMENT BY ADENOCARCINOMA WITH MUCINOUS FEATURES - BENIGN FALLOPIAN TUBE TISSUE 3. Ovary and fallopian tube, right - OVARY WITH INVOLVEMENT BY ADENOCARCINOMA WITH MUCINOUS FEATURES - BENIGN FALLOPIAN TUBE TISSUE 4. Uterus and cervix - SEROSAL INVOLVEMENT BY METASTATIC ADENOCARCINOMA WITH MUCINOUS FEATURES - BENIGN PROLIFERATIVE ENDOMETRIUM ASSOCIATED WITH BENIGN ENDOMETRIAL POLYP - LEIOMYOMATA - FOCAL SEROSAL ENDOMETRIOSIS 5. Omentum, resection for tumor - INVOLVEMENT BY ADENOCARCINOMA WITH MUCINOUS FEATURES Microscopic Comment 1. -5. The omentum, bilateral ovaries and uterine serosa show involvement by moderately differenatiated adenocarcinoma that in many areas displays abundant extracellular mucin. Immunohistochemical stains performed on three representative blocks from omentum and both ovaries show that the tumor cells stain as follows: CDX-2 - positive Cytokeratin 20 - positive Cytokeratin 7 - negative Estrogen receptor - negative Progesterone receptor - negative (nonspecific cytoplasmic staining) WT-1 - negative In lieu of the previous history of right colonic mucinous adenocarcinoma, the histologic appearance as well as the immunohistochemical profile, the overall findings are consistent with involvement by adenocarcinoma from colonic primary. Clinical correlation is strongly recommended. (BNS:kh 12-09-10) 1 of 3 FINAL for STARKISHA, TULLIS ([id number]) Microscopic Comment(continued) Guerry Bruin MD Pathologist, Electronic Signature (Case signed 12/09/2010)   ASSESSMENT:  1. Recurrent metastatic colon cancer. S/P 12 cycles of FOLFOX followed  by a belly wash at [facility] on 08/08/11, which revealed a lesion on the dome of the liver unidentified on radiographic studies.  2. 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 3. Peripheral neuropathy, grade 1  4. GERD   PLAN:  1. Will request note and any  radiographic study reports performed at [facility] for the belly wash procedure.   2. If no scans performed, will perform CT CAP with contrast for restaging purposes.  3. Recommended abdominal binder which the patient has at home from previous operations.   4. Patient encouraged to avoid heavy lifting for another few weeks.  She is to hold her abdomen with cough or sneezing.  5. Will start FOLFIRI with Avastin mid Jan 2013.  Will hold Avastin for first two treatments.  6. Pre-chemo lab work: CBC diff, CMET, UA day of chemotherapy.  7. Removed all staples and placed 3-4 steri-strips.    All questions were answered. The patient knows to call the clinic with any problems, questions or concerns. We can certainly see the patient much sooner if necessary.  The patient and plan discussed with Glenford Peers, MD and he is in agreement with the aforementioned.  I spent 50 minutes counseling the patient face to face. The total time spent in the appointment was 70 minutes. More than 50% of the time spent with the patient was utilized for counseling and coordination of care and removal of staples.  Clancy Mullarkey

## 2011-08-25 ENCOUNTER — Other Ambulatory Visit (HOSPITAL_COMMUNITY): Payer: Self-pay | Admitting: Oncology

## 2011-08-25 DIAGNOSIS — C189 Malignant neoplasm of colon, unspecified: Secondary | ICD-10-CM

## 2011-09-13 ENCOUNTER — Other Ambulatory Visit (HOSPITAL_COMMUNITY): Payer: Self-pay | Admitting: Oncology

## 2011-09-13 DIAGNOSIS — C189 Malignant neoplasm of colon, unspecified: Secondary | ICD-10-CM

## 2011-09-14 ENCOUNTER — Ambulatory Visit (HOSPITAL_COMMUNITY): Payer: Medicaid Other

## 2011-09-14 ENCOUNTER — Other Ambulatory Visit (HOSPITAL_COMMUNITY): Payer: Medicaid Other

## 2011-09-18 ENCOUNTER — Ambulatory Visit (HOSPITAL_COMMUNITY)
Admission: RE | Admit: 2011-09-18 | Discharge: 2011-09-18 | Disposition: A | Discharge status: Home / Self Care | Payer: Medicaid Other | Source: Ambulatory Visit | Attending: Oncology | Admitting: Oncology

## 2011-09-18 ENCOUNTER — Ambulatory Visit (HOSPITAL_COMMUNITY): Payer: Medicaid Other

## 2011-09-18 ENCOUNTER — Encounter (HOSPITAL_COMMUNITY): Payer: Self-pay

## 2011-09-18 DIAGNOSIS — C189 Malignant neoplasm of colon, unspecified: Secondary | ICD-10-CM | POA: Insufficient documentation

## 2011-09-18 DIAGNOSIS — Z9221 Personal history of antineoplastic chemotherapy: Secondary | ICD-10-CM | POA: Insufficient documentation

## 2011-09-18 LAB — POCT I-STAT, CHEM 8
HCT: 35 % — ABNORMAL LOW (ref 36.0–46.0)
Hemoglobin: 11.9 g/dL — ABNORMAL LOW (ref 12.0–15.0)
Potassium: 4.3 mEq/L (ref 3.5–5.1)
Sodium: 140 mEq/L (ref 135–145)
TCO2: 25 mmol/L (ref 0–100)

## 2011-09-18 MED ORDER — IOHEXOL 300 MG/ML  SOLN
100.0000 mL | Freq: Once | INTRAMUSCULAR | Status: AC | PRN
  Administered 2011-09-18: 100 mL via INTRAVENOUS

## 2011-09-18 NOTE — Telephone Encounter (Signed)
Don't know why this encounter is asking for additional notes. 

## 2011-09-20 ENCOUNTER — Encounter (HOSPITAL_COMMUNITY): Payer: Medicaid Other | Attending: Oncology

## 2011-09-20 ENCOUNTER — Encounter (HOSPITAL_COMMUNITY): Payer: Medicaid Other

## 2011-09-20 ENCOUNTER — Other Ambulatory Visit (HOSPITAL_COMMUNITY): Payer: Self-pay | Admitting: Oncology

## 2011-09-20 VITALS — BP 147/83 | HR 91 | Temp 98.0°F | Wt 182.6 lb

## 2011-09-20 DIAGNOSIS — C189 Malignant neoplasm of colon, unspecified: Secondary | ICD-10-CM | POA: Insufficient documentation

## 2011-09-20 DIAGNOSIS — Z5111 Encounter for antineoplastic chemotherapy: Secondary | ICD-10-CM

## 2011-09-20 DIAGNOSIS — C787 Secondary malignant neoplasm of liver and intrahepatic bile duct: Secondary | ICD-10-CM

## 2011-09-20 DIAGNOSIS — R197 Diarrhea, unspecified: Secondary | ICD-10-CM

## 2011-09-20 LAB — COMPREHENSIVE METABOLIC PANEL
ALT: 8 U/L (ref 0–35)
Alkaline Phosphatase: 63 U/L (ref 39–117)
Chloride: 105 mEq/L (ref 96–112)
GFR calc Af Amer: >90 mL/min (ref >90)
Glucose, Bld: 94 mg/dL (ref 70–99)
Potassium: 4.1 mEq/L (ref 3.5–5.1)
Sodium: 139 mEq/L (ref 135–145)
Total Protein: 7.4 g/dL (ref 6.0–8.3)

## 2011-09-20 LAB — DIFFERENTIAL
Eosinophils Absolute: 0.1 10*3/uL (ref 0.0–0.7)
Lymphocytes Relative: 14 % (ref 12–46)
Lymphs Abs: 1.2 10*3/uL (ref 0.7–4.0)
Neutro Abs: 7.2 10*3/uL (ref 1.7–7.7)
Neutrophils Relative %: 80 % — ABNORMAL HIGH (ref 43–77)

## 2011-09-20 LAB — URINALYSIS, DIPSTICK ONLY
Bilirubin Urine: NEGATIVE
Protein, ur: NEGATIVE mg/dL
Urobilinogen, UA: 0.2 mg/dL (ref 0.0–1.0)

## 2011-09-20 LAB — CBC
MCH: 29.9 pg (ref 26.0–34.0)
Platelets: 220 10*3/uL (ref 150–400)
RBC: 3.64 MIL/uL — ABNORMAL LOW (ref 3.87–5.11)
WBC: 9 10*3/uL (ref 4.0–10.5)

## 2011-09-20 MED ORDER — SODIUM CHLORIDE 0.9 % IV SOLN
4500.0000 mg | INTRAVENOUS | Status: DC
  Administered 2011-09-20: 4500 mg via INTRAVENOUS
  Filled 2011-09-20 (×3): qty 90

## 2011-09-20 MED ORDER — LEUCOVORIN CALCIUM INJECTION 350 MG: 400.0000 mg/m2 | Freq: Once | INTRAVENOUS | Status: DC

## 2011-09-20 MED ORDER — LOPERAMIDE HCL 2 MG PO CAPS
4.0000 mg | ORAL_CAPSULE | Freq: Once | ORAL | Status: AC
  Administered 2011-09-20: 4 mg via ORAL
  Filled 2011-09-20: qty 2

## 2011-09-20 MED ORDER — SODIUM CHLORIDE 0.9 % IJ SOLN
10.0000 mL | INTRAMUSCULAR | Status: DC | PRN
  Administered 2011-09-20: 10 mL
  Filled 2011-09-20: qty 10

## 2011-09-20 MED ORDER — IRINOTECAN HCL CHEMO INJECTION 100 MG/5ML
338.0000 mg | Freq: Once | INTRAVENOUS | Status: AC
  Administered 2011-09-20: 338 mg via INTRAVENOUS
  Filled 2011-09-20 (×2): qty 16.9

## 2011-09-20 MED ORDER — SODIUM CHLORIDE 0.9 % IV SOLN
Freq: Once | INTRAVENOUS | Status: AC
  Administered 2011-09-20: 16 mg via INTRAVENOUS
  Filled 2011-09-20: qty 8

## 2011-09-20 MED ORDER — DEXAMETHASONE SODIUM PHOSPHATE 4 MG/ML IJ SOLN: 20.0000 mg | Freq: Once | INTRAMUSCULAR | Status: DC

## 2011-09-20 MED ORDER — LEUCOVORIN CALCIUM INJECTION 100 MG
38.0000 mg | Freq: Once | INTRAMUSCULAR | Status: AC
  Administered 2011-09-20: 38 mg via INTRAVENOUS
  Filled 2011-09-20 (×2): qty 1.9

## 2011-09-20 MED ORDER — FLUOROURACIL CHEMO INJECTION 2.5 GM/50ML
750.0000 mg | Freq: Once | INTRAVENOUS | Status: AC
  Administered 2011-09-20: 750 mg via INTRAVENOUS
  Filled 2011-09-20 (×2): qty 15

## 2011-09-20 MED ORDER — SODIUM CHLORIDE 0.9 % IV SOLN: 16.0000 mg | Freq: Once | INTRAVENOUS | Status: DC

## 2011-09-20 MED ORDER — HEPARIN SOD (PORK) LOCK FLUSH 100 UNIT/ML IV SOLN
500.0000 [IU] | Freq: Once | INTRAVENOUS | Status: DC | PRN
  Filled 2011-09-20: qty 5

## 2011-09-20 MED ORDER — SODIUM CHLORIDE 0.9 % IV SOLN
Freq: Once | INTRAVENOUS | Status: AC
  Administered 2011-09-20: 10:00:00 via INTRAVENOUS

## 2011-09-20 NOTE — Patient Instructions (Signed)
[facility] Discharge Instructions for Patients Receiving Chemotherapy  Today you received the following chemotherapy agents camptosar, 41fu, and leucovorin. We will add avastin with your 3rd treatment.  To help prevent nausea and vomiting after your treatment, we encourage you to take your nausea medication  Ativan 1mg  every 3 to 4 hours as needed for nausea or vomiting zofran  Take 8 mg by mouth 2 (two) times daily as needed. Nausea/vomiting Compazine suppositories--Place 25 mg rectally every 8 (eight) hours as needed  If you develop nausea and vomiting that is not controlled by your nausea medication, call the clinic. If it is after clinic hours your family physician or the after hours number for the clinic or go to the Emergency Department.   BELOW ARE SYMPTOMS THAT SHOULD BE REPORTED IMMEDIATELY:  *FEVER GREATER THAN 101.0 F  *CHILLS WITH OR WITHOUT FEVER  NAUSEA AND VOMITING THAT IS NOT CONTROLLED WITH YOUR NAUSEA MEDICATION  *UNUSUAL SHORTNESS OF BREATH  *UNUSUAL BRUISING OR BLEEDING  TENDERNESS IN MOUTH AND THROAT WITH OR WITHOUT PRESENCE OF ULCERS  *URINARY PROBLEMS  *BOWEL PROBLEMS  UNUSUAL RASH Items with * indicate a potential emergency and should be followed up as soon as possible.  One of the nurses will contact you 24 hours after your treatment. Please let the nurse know about any problems that you may have experienced. Feel free to call the clinic you have any questions or concerns. The clinic phone number is [phone].   I have been informed and understand all the instructions given to me. I know to contact the clinic, my physician, or go to the Emergency Department if any problems should occur. I do not have any questions at this time, but understand that I may call the clinic during office hours or the Patient Navigator at [phone] should I have any questions or need assistance in obtaining follow up  care.    __________________________________________  _____________  __________ Signature of Patient or Authorized Representative            Date                   Time    __________________________________________ Nurse's Signature

## 2011-09-20 NOTE — Progress Notes (Signed)
Tolerated well. Instructed to take immodium 1 every 2 hrs as needed for diarrhea. 2 tabs given while here today for 4 loose stools.

## 2011-09-22 ENCOUNTER — Encounter (HOSPITAL_BASED_OUTPATIENT_CLINIC_OR_DEPARTMENT_OTHER): Payer: Medicaid Other

## 2011-09-22 VITALS — BP 133/83 | HR 71

## 2011-09-22 DIAGNOSIS — Z452 Encounter for adjustment and management of vascular access device: Secondary | ICD-10-CM

## 2011-09-22 DIAGNOSIS — C189 Malignant neoplasm of colon, unspecified: Secondary | ICD-10-CM

## 2011-09-22 MED ORDER — HEPARIN SOD (PORK) LOCK FLUSH 100 UNIT/ML IV SOLN
500.0000 [IU] | Freq: Once | INTRAVENOUS | Status: AC | PRN
  Administered 2011-09-22: 500 [IU]
  Filled 2011-09-22: qty 5

## 2011-09-22 MED ORDER — SODIUM CHLORIDE 0.9 % IJ SOLN
10.0000 mL | INTRAMUSCULAR | Status: DC | PRN
  Administered 2011-09-22: 10 mL
  Filled 2011-09-22: qty 10

## 2011-09-22 MED ORDER — HEPARIN SOD (PORK) LOCK FLUSH 100 UNIT/ML IV SOLN
INTRAVENOUS | Status: AC
  Filled 2011-09-22: qty 5

## 2011-09-22 MED ORDER — SODIUM CHLORIDE 0.9 % IJ SOLN
INTRAMUSCULAR | Status: AC
  Filled 2011-09-22: qty 10

## 2011-09-22 NOTE — Progress Notes (Signed)
Yolanda Friedman presented for Portacathdeaccess, pump removal  and flush. Proper placement of portacath confirmed by CXR. Portacath located rt chest wall deaccessed. Infusion complete. Good blood return present. Portacath flushed with 20ml NS and 500U/72ml Heparin and needle removed intact. Procedure without incident. Patient tolerated procedure well and no problems post chemo.

## 2011-10-02 ENCOUNTER — Encounter (HOSPITAL_BASED_OUTPATIENT_CLINIC_OR_DEPARTMENT_OTHER): Payer: Medicaid Other | Admitting: Oncology

## 2011-10-02 ENCOUNTER — Encounter (HOSPITAL_COMMUNITY): Payer: Self-pay | Admitting: Oncology

## 2011-10-02 VITALS — BP 126/79 | HR 83 | Temp 98.1°F | Wt 181.0 lb

## 2011-10-02 DIAGNOSIS — R109 Unspecified abdominal pain: Secondary | ICD-10-CM

## 2011-10-02 DIAGNOSIS — G609 Hereditary and idiopathic neuropathy, unspecified: Secondary | ICD-10-CM

## 2011-10-02 DIAGNOSIS — K1370 Unspecified lesions of oral mucosa: Secondary | ICD-10-CM

## 2011-10-02 DIAGNOSIS — C189 Malignant neoplasm of colon, unspecified: Secondary | ICD-10-CM

## 2011-10-02 LAB — DIFFERENTIAL
Basophils Relative: 0 % (ref 0–1)
Lymphs Abs: 1.8 10*3/uL (ref 0.7–4.0)
Monocytes Relative: 5 % (ref 3–12)
Neutro Abs: 2.5 10*3/uL (ref 1.7–7.7)
Neutrophils Relative %: 55 % (ref 43–77)

## 2011-10-02 LAB — COMPREHENSIVE METABOLIC PANEL
AST: 20 U/L (ref 0–37)
Albumin: 3.8 g/dL (ref 3.5–5.2)
Calcium: 10 mg/dL (ref 8.4–10.5)
Creatinine, Ser: 0.7 mg/dL (ref 0.50–1.10)

## 2011-10-02 LAB — CBC
MCH: 30 pg (ref 26.0–34.0)
MCV: 92.9 fL (ref 78.0–100.0)
Platelets: 213 10*3/uL (ref 150–400)
RDW: 14.8 % (ref 11.5–15.5)
WBC: 4.6 10*3/uL (ref 4.0–10.5)

## 2011-10-02 NOTE — Progress Notes (Signed)
This office note has been dictated.

## 2011-10-02 NOTE — Progress Notes (Signed)
DIAGNOSIS:  Metastatic recurrent colon cancer.  She is now on chemotherapy with FOLFIRI and Avastin will be started very shortly since she is postoperative several weeks now.  Yolanda Friedman is doing well.  She is here with her son, with whom she lives, and his girlfriend.  She is having trouble with abdominal pain after the chemotherapy and a very sore mouth.  She also has, still, grade 2 peripheral neuropathy symptoms in her hands and feet from the oxaliplatin.  They are always cold but she is walking.  She is not that dysfunctional.  I think she is probably getting closer to grade 1, really, at this juncture.  She also used to have a shooting pain down her spine when she looked down at her feet as if she would have Lhermitte's sign; that has gone away completely.  But, I think I need to reduce her dose of 5-FU after talking to her.  She has loose stools all the time, never formed, basically.  She moves her bowels about 2-3 times a day, but it is usually 3 times a day.  What is interesting is that her weight is not getting worse, realistically.  Her other vital signs today are fine.  She is in no acute distress.  She actually looks very good. She has no adenopathy in the cervical, supraclavicular, infraclavicular, axillary, or inguinal areas.  Bowel sounds are normal at this time.  Her incision is healing nicely.  So, I think we can add the Avastin back in February.  Heart shows a regular rhythm and rate without murmur rub or gallop.  Port is intact.  Her lungs are clear to auscultation and percussion.  She has no peripheral edema.  So, I think she is doing well.  I am going to reduce her dose of 5-FU from 2400 mg/sq m to 2000 mg/sq m, keep the dose of  CPT-11 the same, and we will add the Avastin in February.  I will get her to see my PA, Jenita Seashore, in a month.  I will try to see her after that.  But, we are going to try to go with 6 full cycles of this, perhaps more.  But, we will see of  course what her scans show, CEA shows, etc.    ______________________________ Ladona Horns. Mariel Sleet, MD ESN/MEDQ  D:  10/02/2011  T:  10/02/2011  Job:  [id number]

## 2011-10-02 NOTE — Patient Instructions (Signed)
[facility]  Discharge Instructions  RECOMMENDATIONS MADE BY THE CONSULTANT AND ANY TEST RESULTS WILL BE SENT TO YOUR REFERRING DOCTOR.   EXAM FINDINGS BY MD TODAY AND SIGNS AND SYMPTOMS TO REPORT TO CLINIC OR PRIMARY MD: Dr.Neijstrom is decreasing your dose of chemotherapy. We will do lab work today. Continue with chemotherapy as scheduled.    I acknowledge that I have been informed and understand all the instructions given to me and received a copy. I do not have any more questions at this time, but understand that I may call the Specialty Clinic at [facility] at [phone] during business hours should I have any further questions or need assistance in obtaining follow-up care.    __________________________________________  _____________  __________ Signature of Patient or Authorized Representative            Date                   Time    __________________________________________ Nurse's Signature

## 2011-10-03 ENCOUNTER — Other Ambulatory Visit (HOSPITAL_COMMUNITY): Payer: Medicaid Other

## 2011-10-04 ENCOUNTER — Other Ambulatory Visit (HOSPITAL_COMMUNITY): Payer: Self-pay | Admitting: Oncology

## 2011-10-04 ENCOUNTER — Encounter (HOSPITAL_BASED_OUTPATIENT_CLINIC_OR_DEPARTMENT_OTHER): Payer: Medicaid Other

## 2011-10-04 VITALS — BP 134/82 | HR 83 | Temp 98.5°F | Ht 60.0 in | Wt 181.0 lb

## 2011-10-04 DIAGNOSIS — C787 Secondary malignant neoplasm of liver and intrahepatic bile duct: Secondary | ICD-10-CM

## 2011-10-04 DIAGNOSIS — R110 Nausea: Secondary | ICD-10-CM

## 2011-10-04 DIAGNOSIS — Z5111 Encounter for antineoplastic chemotherapy: Secondary | ICD-10-CM

## 2011-10-04 DIAGNOSIS — C189 Malignant neoplasm of colon, unspecified: Secondary | ICD-10-CM

## 2011-10-04 MED ORDER — LEUCOVORIN CALCIUM INJECTION 350 MG: 400.0000 mg/m2 | Freq: Once | INTRAVENOUS | Status: DC

## 2011-10-04 MED ORDER — SODIUM CHLORIDE 0.9 % IJ SOLN
10.0000 mL | INTRAMUSCULAR | Status: DC | PRN
  Administered 2011-10-04: 10 mL
  Filled 2011-10-04: qty 10

## 2011-10-04 MED ORDER — LORAZEPAM 2 MG/ML IJ SOLN
0.5000 mg | Freq: Once | INTRAMUSCULAR | Status: AC
  Administered 2011-10-04: 0.5 mg via INTRAVENOUS

## 2011-10-04 MED ORDER — LORAZEPAM 2 MG/ML IJ SOLN
INTRAMUSCULAR | Status: AC
  Filled 2011-10-04: qty 1

## 2011-10-04 MED ORDER — SODIUM CHLORIDE 0.9 % IV SOLN
Freq: Once | INTRAVENOUS | Status: AC
  Administered 2011-10-04: 11:00:00 via INTRAVENOUS

## 2011-10-04 MED ORDER — FLUOROURACIL CHEMO INJECTION 2.5 GM/50ML
400.0000 mg/m2 | Freq: Once | INTRAVENOUS | Status: AC
  Administered 2011-10-04: 750 mg via INTRAVENOUS
  Filled 2011-10-04: qty 15

## 2011-10-04 MED ORDER — SODIUM CHLORIDE 0.9 % IV SOLN: 16.0000 mg | Freq: Once | INTRAVENOUS | Status: DC

## 2011-10-04 MED ORDER — IRINOTECAN HCL CHEMO INJECTION 100 MG/5ML
150.0000 mg/m2 | Freq: Once | INTRAVENOUS | Status: AC
  Administered 2011-10-04: 282 mg via INTRAVENOUS
  Filled 2011-10-04 (×2): qty 14.1

## 2011-10-04 MED ORDER — LEUCOVORIN CALCIUM INJECTION 100 MG
38.0000 mg | Freq: Once | INTRAMUSCULAR | Status: AC
  Administered 2011-10-04: 38 mg via INTRAVENOUS
  Filled 2011-10-04: qty 1.9

## 2011-10-04 MED ORDER — SODIUM CHLORIDE 0.9 % IJ SOLN
INTRAMUSCULAR | Status: AC
  Filled 2011-10-04: qty 10

## 2011-10-04 MED ORDER — ATROPINE SULFATE 1 MG/ML IJ SOLN
0.5000 mg | Freq: Once | INTRAMUSCULAR | Status: DC | PRN
  Filled 2011-10-04: qty 0.5

## 2011-10-04 MED ORDER — SODIUM CHLORIDE 0.9 % IV SOLN
Freq: Once | INTRAVENOUS | Status: AC
  Administered 2011-10-04: 16 mg via INTRAVENOUS
  Filled 2011-10-04: qty 8

## 2011-10-04 MED ORDER — FLUOROURACIL CHEMO INJECTION 5 GM/100ML
2000.0000 mg/m2 | INTRAVENOUS | Status: DC
  Administered 2011-10-04: 3750 mg via INTRAVENOUS
  Filled 2011-10-04 (×3): qty 75

## 2011-10-04 MED ORDER — FLUOROURACIL CHEMO INJECTION 2.5 GM/50ML: 400.0000 mg/m2 | Freq: Once | INTRAVENOUS | Status: DC

## 2011-10-04 MED ORDER — DEXAMETHASONE SODIUM PHOSPHATE 4 MG/ML IJ SOLN: 20.0000 mg | Freq: Once | INTRAMUSCULAR | Status: DC

## 2011-10-04 NOTE — Progress Notes (Signed)
Ativan 0.5 mg given iv for c/o nausea and stomach pain which were relieved with in 30 min of ativan.

## 2011-10-06 ENCOUNTER — Encounter (HOSPITAL_COMMUNITY): Payer: Medicaid Other | Attending: Oncology

## 2011-10-06 DIAGNOSIS — C189 Malignant neoplasm of colon, unspecified: Secondary | ICD-10-CM | POA: Insufficient documentation

## 2011-10-06 DIAGNOSIS — Z452 Encounter for adjustment and management of vascular access device: Secondary | ICD-10-CM

## 2011-10-06 DIAGNOSIS — C787 Secondary malignant neoplasm of liver and intrahepatic bile duct: Secondary | ICD-10-CM

## 2011-10-06 MED ORDER — HEPARIN SOD (PORK) LOCK FLUSH 100 UNIT/ML IV SOLN
500.0000 [IU] | Freq: Once | INTRAVENOUS | Status: AC | PRN
  Administered 2011-10-06: 500 [IU]
  Filled 2011-10-06: qty 5

## 2011-10-06 MED ORDER — SODIUM CHLORIDE 0.9 % IJ SOLN
INTRAMUSCULAR | Status: AC
  Administered 2011-10-06: 10 mL
  Filled 2011-10-06: qty 10

## 2011-10-06 MED ORDER — HEPARIN SOD (PORK) LOCK FLUSH 100 UNIT/ML IV SOLN
INTRAVENOUS | Status: AC
  Administered 2011-10-06: 500 [IU]
  Filled 2011-10-06: qty 5

## 2011-10-17 ENCOUNTER — Encounter (HOSPITAL_BASED_OUTPATIENT_CLINIC_OR_DEPARTMENT_OTHER): Payer: Medicaid Other

## 2011-10-17 DIAGNOSIS — C787 Secondary malignant neoplasm of liver and intrahepatic bile duct: Secondary | ICD-10-CM

## 2011-10-17 DIAGNOSIS — C189 Malignant neoplasm of colon, unspecified: Secondary | ICD-10-CM

## 2011-10-17 LAB — URINALYSIS, DIPSTICK ONLY
Ketones, ur: NEGATIVE mg/dL
Leukocytes, UA: NEGATIVE
Nitrite: NEGATIVE
Specific Gravity, Urine: 1.02 (ref 1.005–1.030)
Urobilinogen, UA: 0.2 mg/dL (ref 0.0–1.0)
pH: 5.5 (ref 5.0–8.0)

## 2011-10-17 LAB — COMPREHENSIVE METABOLIC PANEL
AST: 20 U/L (ref 0–37)
BUN: 10 mg/dL (ref 6–23)
CO2: 30 mEq/L (ref 19–32)
Chloride: 103 mEq/L (ref 96–112)
Creatinine, Ser: 0.72 mg/dL (ref 0.50–1.10)
GFR calc Af Amer: >90 mL/min (ref >90)
GFR calc non Af Amer: >90 mL/min (ref >90)
Glucose, Bld: 118 mg/dL — ABNORMAL HIGH (ref 70–99)
Total Bilirubin: 0.2 mg/dL — ABNORMAL LOW (ref 0.3–1.2)

## 2011-10-17 LAB — DIFFERENTIAL
Basophils Absolute: 0 10*3/uL (ref 0.0–0.1)
Basophils Relative: 0 % (ref 0–1)
Monocytes Absolute: 0.3 10*3/uL (ref 0.1–1.0)
Neutro Abs: 1.6 10*3/uL — ABNORMAL LOW (ref 1.7–7.7)
Neutrophils Relative %: 39 % — ABNORMAL LOW (ref 43–77)

## 2011-10-17 LAB — CBC
HCT: 33.7 % — ABNORMAL LOW (ref 36.0–46.0)
Hemoglobin: 10.8 g/dL — ABNORMAL LOW (ref 12.0–15.0)
MCH: 29.5 pg (ref 26.0–34.0)
MCV: 92.1 fL (ref 78.0–100.0)
RBC: 3.66 MIL/uL — ABNORMAL LOW (ref 3.87–5.11)
WBC: 4.1 10*3/uL (ref 4.0–10.5)

## 2011-10-17 NOTE — Progress Notes (Signed)
Labs drawn today for cbc/diff,cmp , and ua for dipstick

## 2011-10-18 ENCOUNTER — Encounter (HOSPITAL_BASED_OUTPATIENT_CLINIC_OR_DEPARTMENT_OTHER): Payer: Medicaid Other

## 2011-10-18 VITALS — BP 156/91 | HR 110 | Temp 97.5°F | Ht 60.0 in | Wt 181.0 lb

## 2011-10-18 DIAGNOSIS — C189 Malignant neoplasm of colon, unspecified: Secondary | ICD-10-CM

## 2011-10-18 DIAGNOSIS — R110 Nausea: Secondary | ICD-10-CM

## 2011-10-18 DIAGNOSIS — Z5111 Encounter for antineoplastic chemotherapy: Secondary | ICD-10-CM

## 2011-10-18 DIAGNOSIS — C787 Secondary malignant neoplasm of liver and intrahepatic bile duct: Secondary | ICD-10-CM

## 2011-10-18 MED ORDER — LORAZEPAM 2 MG/ML IJ SOLN
INTRAMUSCULAR | Status: AC
  Filled 2011-10-18: qty 1

## 2011-10-18 MED ORDER — FLUOROURACIL CHEMO INJECTION 2.5 GM/50ML
400.0000 mg/m2 | Freq: Once | INTRAVENOUS | Status: AC
  Administered 2011-10-18: 750 mg via INTRAVENOUS
  Filled 2011-10-18: qty 15

## 2011-10-18 MED ORDER — SODIUM CHLORIDE 0.9 % IV SOLN: 16.0000 mg | Freq: Once | INTRAVENOUS | Status: DC

## 2011-10-18 MED ORDER — SODIUM CHLORIDE 0.9 % IV SOLN
5.0000 mg/kg | Freq: Once | INTRAVENOUS | Status: AC
  Administered 2011-10-18: 425 mg via INTRAVENOUS
  Filled 2011-10-18 (×2): qty 17

## 2011-10-18 MED ORDER — LEUCOVORIN CALCIUM INJECTION 350 MG: 400.0000 mg/m2 | Freq: Once | INTRAMUSCULAR | Status: DC

## 2011-10-18 MED ORDER — SODIUM CHLORIDE 0.9 % IV SOLN
Freq: Once | INTRAVENOUS | Status: AC
  Administered 2011-10-18: 10:00:00 via INTRAVENOUS

## 2011-10-18 MED ORDER — SODIUM CHLORIDE 0.9 % IJ SOLN
10.0000 mL | INTRAMUSCULAR | Status: DC | PRN
  Filled 2011-10-18: qty 10

## 2011-10-18 MED ORDER — IRINOTECAN HCL CHEMO INJECTION 100 MG/5ML
150.0000 mg/m2 | Freq: Once | INTRAVENOUS | Status: AC
  Administered 2011-10-18: 282 mg via INTRAVENOUS
  Filled 2011-10-18: qty 14.1

## 2011-10-18 MED ORDER — SODIUM CHLORIDE 0.9 % IJ SOLN
INTRAMUSCULAR | Status: AC
  Filled 2011-10-18: qty 10

## 2011-10-18 MED ORDER — ONDANSETRON HCL 4 MG/2ML IJ SOLN
Freq: Once | INTRAMUSCULAR | Status: AC
  Administered 2011-10-18: 16 mg via INTRAVENOUS
  Filled 2011-10-18: qty 8

## 2011-10-18 MED ORDER — DEXAMETHASONE SODIUM PHOSPHATE 4 MG/ML IJ SOLN: 20.0000 mg | Freq: Once | INTRAMUSCULAR | Status: DC

## 2011-10-18 MED ORDER — LORAZEPAM 2 MG/ML IJ SOLN
0.5000 mg | Freq: Once | INTRAMUSCULAR | Status: AC
  Administered 2011-10-18: 0.5 mg via INTRAVENOUS

## 2011-10-18 MED ORDER — SODIUM CHLORIDE 0.9 % IV SOLN
2000.0000 mg/m2 | INTRAVENOUS | Status: DC
  Administered 2011-10-18: 3750 mg via INTRAVENOUS
  Filled 2011-10-18 (×2): qty 75

## 2011-10-18 MED ORDER — LEUCOVORIN CALCIUM INJECTION 100 MG
38.0000 mg | Freq: Once | INTRAMUSCULAR | Status: AC
  Administered 2011-10-18: 38 mg via INTRAVENOUS
  Filled 2011-10-18: qty 1.9

## 2011-10-18 NOTE — Progress Notes (Signed)
Pt experienced nausea half-way thru CPT 11 ativan given and she reports feeling better.

## 2011-10-19 ENCOUNTER — Other Ambulatory Visit (HOSPITAL_COMMUNITY): Payer: Self-pay | Admitting: Oncology

## 2011-10-19 NOTE — Progress Notes (Signed)
Addended by: Oda Kilts on: 10/19/2011 02:58 PM   Modules accepted: Orders

## 2011-10-20 ENCOUNTER — Encounter (HOSPITAL_BASED_OUTPATIENT_CLINIC_OR_DEPARTMENT_OTHER): Payer: Medicaid Other

## 2011-10-20 DIAGNOSIS — Z452 Encounter for adjustment and management of vascular access device: Secondary | ICD-10-CM

## 2011-10-20 DIAGNOSIS — C189 Malignant neoplasm of colon, unspecified: Secondary | ICD-10-CM

## 2011-10-20 MED ORDER — SODIUM CHLORIDE 0.9 % IJ SOLN
INTRAMUSCULAR | Status: AC
  Administered 2011-10-20: 10 mL
  Filled 2011-10-20: qty 10

## 2011-10-20 MED ORDER — HEPARIN SOD (PORK) LOCK FLUSH 100 UNIT/ML IV SOLN
500.0000 [IU] | Freq: Once | INTRAVENOUS | Status: AC | PRN
  Administered 2011-10-20: 500 [IU]
  Filled 2011-10-20: qty 5

## 2011-10-20 MED ORDER — SODIUM CHLORIDE 0.9 % IJ SOLN
10.0000 mL | INTRAMUSCULAR | Status: DC | PRN
  Administered 2011-10-20: 10 mL
  Filled 2011-10-20: qty 10

## 2011-10-20 MED ORDER — HEPARIN SOD (PORK) LOCK FLUSH 100 UNIT/ML IV SOLN
INTRAVENOUS | Status: AC
  Administered 2011-10-20: 500 [IU]
  Filled 2011-10-20: qty 5

## 2011-10-20 NOTE — Progress Notes (Signed)
Yolanda Friedman presented for Portacath access and flush. Proper placement of portacath confirmed by CXR. Portacath located right chest wall deaccessed; 5-FU pump infusion completed. Portacath flushed with 20ml NS and 500U/55ml Heparin and needle removed intact. Procedure without incident. Patient tolerated procedure well.

## 2011-10-30 ENCOUNTER — Ambulatory Visit (HOSPITAL_COMMUNITY): Payer: Medicaid Other | Admitting: Oncology

## 2011-10-31 ENCOUNTER — Other Ambulatory Visit (HOSPITAL_COMMUNITY): Payer: Medicaid Other

## 2011-10-31 ENCOUNTER — Encounter (HOSPITAL_BASED_OUTPATIENT_CLINIC_OR_DEPARTMENT_OTHER): Payer: Medicaid Other

## 2011-10-31 DIAGNOSIS — C189 Malignant neoplasm of colon, unspecified: Secondary | ICD-10-CM

## 2011-10-31 LAB — CBC
Hemoglobin: 11.1 g/dL — ABNORMAL LOW (ref 12.0–15.0)
MCHC: 32.6 g/dL (ref 30.0–36.0)
RBC: 3.75 MIL/uL — ABNORMAL LOW (ref 3.87–5.11)

## 2011-10-31 LAB — COMPREHENSIVE METABOLIC PANEL
ALT: 32 U/L (ref 0–35)
AST: 25 U/L (ref 0–37)
Albumin: 3.7 g/dL (ref 3.5–5.2)
Alkaline Phosphatase: 70 U/L (ref 39–117)
BUN: 13 mg/dL (ref 6–23)
Chloride: 103 mEq/L (ref 96–112)
Potassium: 4.5 mEq/L (ref 3.5–5.1)
Sodium: 139 mEq/L (ref 135–145)
Total Bilirubin: 0.2 mg/dL — ABNORMAL LOW (ref 0.3–1.2)

## 2011-10-31 LAB — DIFFERENTIAL
Basophils Relative: 0 % (ref 0–1)
Monocytes Relative: 7 % (ref 3–12)
Neutro Abs: 2 10*3/uL (ref 1.7–7.7)
Neutrophils Relative %: 43 % (ref 43–77)

## 2011-10-31 LAB — URINALYSIS, DIPSTICK ONLY
Glucose, UA: NEGATIVE mg/dL
Leukocytes, UA: NEGATIVE
Nitrite: NEGATIVE
Protein, ur: NEGATIVE mg/dL
Urobilinogen, UA: 0.2 mg/dL (ref 0.0–1.0)

## 2011-11-01 ENCOUNTER — Encounter (HOSPITAL_BASED_OUTPATIENT_CLINIC_OR_DEPARTMENT_OTHER): Payer: Medicaid Other

## 2011-11-01 ENCOUNTER — Other Ambulatory Visit (HOSPITAL_COMMUNITY): Payer: Self-pay | Admitting: Oncology

## 2011-11-01 VITALS — BP 143/83 | HR 88 | Temp 97.4°F | Ht 60.0 in | Wt 179.0 lb

## 2011-11-01 DIAGNOSIS — C189 Malignant neoplasm of colon, unspecified: Secondary | ICD-10-CM

## 2011-11-01 DIAGNOSIS — Z5111 Encounter for antineoplastic chemotherapy: Secondary | ICD-10-CM

## 2011-11-01 DIAGNOSIS — C787 Secondary malignant neoplasm of liver and intrahepatic bile duct: Secondary | ICD-10-CM

## 2011-11-01 DIAGNOSIS — R110 Nausea: Secondary | ICD-10-CM

## 2011-11-01 MED ORDER — SODIUM CHLORIDE 0.9 % IV SOLN
2000.0000 mg/m2 | INTRAVENOUS | Status: DC
  Administered 2011-11-01: 3750 mg via INTRAVENOUS
  Filled 2011-11-01 (×2): qty 75

## 2011-11-01 MED ORDER — SODIUM CHLORIDE 0.9 % IJ SOLN
10.0000 mL | INTRAMUSCULAR | Status: DC | PRN
  Filled 2011-11-01: qty 10

## 2011-11-01 MED ORDER — DEXAMETHASONE SODIUM PHOSPHATE 4 MG/ML IJ SOLN: 20.0000 mg | Freq: Once | INTRAMUSCULAR | Status: DC

## 2011-11-01 MED ORDER — LORAZEPAM 2 MG/ML IJ SOLN
0.5000 mg | Freq: Once | INTRAMUSCULAR | Status: AC
  Administered 2011-11-01: 0.5 mg via INTRAVENOUS

## 2011-11-01 MED ORDER — SODIUM CHLORIDE 0.9 % IV SOLN
5.0000 mg/kg | Freq: Once | INTRAVENOUS | Status: AC
  Administered 2011-11-01: 425 mg via INTRAVENOUS
  Filled 2011-11-01: qty 17

## 2011-11-01 MED ORDER — LEUCOVORIN CALCIUM INJECTION 350 MG: 400.0000 mg/m2 | Freq: Once | INTRAVENOUS | Status: DC

## 2011-11-01 MED ORDER — LORAZEPAM 2 MG/ML IJ SOLN
INTRAMUSCULAR | Status: AC
  Administered 2011-11-01: 0.5 mg via INTRAVENOUS
  Filled 2011-11-01: qty 1

## 2011-11-01 MED ORDER — HEPARIN SOD (PORK) LOCK FLUSH 100 UNIT/ML IV SOLN
500.0000 [IU] | Freq: Once | INTRAVENOUS | Status: DC | PRN
  Filled 2011-11-01: qty 5

## 2011-11-01 MED ORDER — SODIUM CHLORIDE 0.9 % IV SOLN
Freq: Once | INTRAVENOUS | Status: AC
  Administered 2011-11-01: 16 mg via INTRAVENOUS
  Filled 2011-11-01: qty 8

## 2011-11-01 MED ORDER — ONDANSETRON HCL 40 MG/20ML IJ SOLN: 16.0000 mg | Freq: Once | INTRAMUSCULAR | Status: DC

## 2011-11-01 MED ORDER — SODIUM CHLORIDE 0.9 % IV SOLN
Freq: Once | INTRAVENOUS | Status: AC
  Administered 2011-11-01: 10:00:00 via INTRAVENOUS

## 2011-11-01 MED ORDER — LEUCOVORIN CALCIUM INJECTION 100 MG
38.0000 mg | Freq: Once | INTRAMUSCULAR | Status: AC
  Administered 2011-11-01: 38 mg via INTRAVENOUS
  Filled 2011-11-01: qty 1.9

## 2011-11-01 MED ORDER — FLUOROURACIL CHEMO INJECTION 2.5 GM/50ML
400.0000 mg/m2 | Freq: Once | INTRAVENOUS | Status: AC
  Administered 2011-11-01: 750 mg via INTRAVENOUS
  Filled 2011-11-01: qty 15

## 2011-11-01 MED ORDER — IRINOTECAN HCL CHEMO INJECTION 100 MG/5ML
150.0000 mg/m2 | Freq: Once | INTRAVENOUS | Status: AC
  Administered 2011-11-01: 282 mg via INTRAVENOUS
  Filled 2011-11-01: qty 14.1

## 2011-11-01 NOTE — Progress Notes (Signed)
Tolerated chemo well. 

## 2011-11-03 ENCOUNTER — Encounter (HOSPITAL_COMMUNITY): Payer: Medicaid Other | Attending: Oncology

## 2011-11-03 ENCOUNTER — Encounter (HOSPITAL_COMMUNITY): Payer: Self-pay | Admitting: Oncology

## 2011-11-03 ENCOUNTER — Encounter (HOSPITAL_BASED_OUTPATIENT_CLINIC_OR_DEPARTMENT_OTHER): Payer: Medicaid Other | Admitting: Oncology

## 2011-11-03 VITALS — BP 103/70 | HR 87 | Temp 98.2°F

## 2011-11-03 DIAGNOSIS — C7960 Secondary malignant neoplasm of unspecified ovary: Secondary | ICD-10-CM

## 2011-11-03 DIAGNOSIS — C189 Malignant neoplasm of colon, unspecified: Secondary | ICD-10-CM | POA: Insufficient documentation

## 2011-11-03 DIAGNOSIS — C787 Secondary malignant neoplasm of liver and intrahepatic bile duct: Secondary | ICD-10-CM

## 2011-11-03 MED ORDER — HEPARIN SOD (PORK) LOCK FLUSH 100 UNIT/ML IV SOLN
INTRAVENOUS | Status: AC
  Filled 2011-11-03: qty 5

## 2011-11-03 MED ORDER — SODIUM CHLORIDE 0.9 % IJ SOLN
INTRAMUSCULAR | Status: AC
  Filled 2011-11-03: qty 10

## 2011-11-03 MED ORDER — HEPARIN SOD (PORK) LOCK FLUSH 100 UNIT/ML IV SOLN
500.0000 [IU] | Freq: Once | INTRAVENOUS | Status: AC | PRN
  Administered 2011-11-03: 500 [IU]
  Filled 2011-11-03: qty 5

## 2011-11-03 MED ORDER — SODIUM CHLORIDE 0.9 % IJ SOLN
10.0000 mL | INTRAMUSCULAR | Status: DC | PRN
  Administered 2011-11-03: 10 mL
  Filled 2011-11-03: qty 10

## 2011-11-03 NOTE — Progress Notes (Signed)
No primary provider on file. No primary provider on file.  1. Metastatic recurrent adenocarcinoma of colon  SCHEDULING COMMUNICATION, sodium chloride 0.9 % injection 10 mL, heparin lock flush 100 unit/mL, CBC, Differential, Comprehensive metabolic panel, CEA, Urinalysis, dipstick only    CURRENT THERAPY: S/P 4 cycles of FOLFIRI + Avastin.  S/P Belly wash at [facility].  S/P 12 cycles of FOLFOX + Avastin finishing on 06/27/2011.  INTERVAL HISTORY: Yolanda Friedman 61 y.o. female returns for  regular  visit for followup of Metastatic recurrent colon cancer.  The patient reports that she is tolerating chemotherapy well. She denies any complaints at today's visit. She is accompanied by her sister today.  The patient reports that she will be traveling in the very near future to the [place] area. She has many family and friends in this region and would like to visit for them. She says that she will be visiting with family and friends and her church. She is really looking forward to this trip. She reports that she will certainly be back in time for chemotherapy administration. She is due for cycle 5 of FOLFOX urea plus Avastin chemotherapy which is scheduled for 11/15/2011. She requests that her prechemotherapy laboratory work is performed that day of therapy. As result I order standing orders for all her prechemotherapy laboratory work for the next 3 or 4 cycles.  Again, and she denies any complaints. I've asked her she needs any medicine refills that we are providing her prior to her trip, she responds that she has enough medication.  Her abdomen is well healed and does not display any open areas or drainage.  ROS: No TIA's or unusual headaches, no dysphagia.  No prolonged cough. No dyspnea or chest pain on exertion.  No abdominal pain, change in bowel habits, black or bloody stools.  No urinary tract symptoms.  No new or unusual musculoskeletal symptoms.  Normal menses, no  abnormal vaginal bleeding, discharge or unexpected pelvic pain. No new breast lumps, breast pain or nipple discharge.     Past Medical History  Diagnosis Date  . Cancer   . Colon cancer 2005    metastatic 2012  . 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 Hypertension   . Diabetes mellitus     no oral or insulin    has Metastatic recurrent adenocarcinoma of colon on her problem list.      has no known allergies.  We administered sodium chloride and heparin lock flush.  Past Surgical History  Procedure Date  . Cholecystectomy 1987  . Colectomy 2005    colon ca  . Abdominal hysterectomy 12/20/2010    met colon ca  . Portacath placement 01/2011    Denies any headaches, dizziness, double vision, fevers, chills, night sweats, nausea, vomiting, diarrhea, constipation, chest pain, heart palpitations, shortness of breath, blood in stool, black tarry stool, urinary pain, urinary burning, urinary frequency, hematuria.   PHYSICAL EXAMINATION  ECOG PERFORMANCE STATUS: 0 - Asymptomatic  Filed Vitals:   11/03/11 1335  BP: 103/70  Pulse: 87  Temp: 98.2 F (36.8 C)    GENERAL:alert, no distress, well nourished, well developed, comfortable, cooperative, obese and smiling SKIN: skin color, texture, turgor are normal, no rashes or significant lesions HEAD: Normocephalic, No masses, lesions, tenderness or abnormalities EYES: normal EARS: External ears normal OROPHARYNX:mucous membranes are moist  NECK: supple, trachea midline LYMPH:  no palpable lymphadenopathy BREAST:not examined LUNGS: clear to auscultation and percussion HEART: regular rate &  rhythm, no murmurs, no gallops, S1 normal and S2 normal ABDOMEN:abdomen soft, non-tender, obese, normal bowel sounds, no masses or organomegaly and midline surgical incision is clean and well healed without any signs of open areas or discharge. BACK: Back symmetric, no curvature., No CVA tenderness EXTREMITIES:less then 2  second capillary refill, no joint deformities, effusion, or inflammation, no skin discoloration, no clubbing, no cyanosis, positive findings:  edema trace bilateral lower extremity pitting edema  NEURO: alert & oriented x 3 with fluent speech, no focal motor/sensory deficits, gait normal   LABORATORY DATA: CBC    Component Value Date/Time   WBC 4.6 10/31/2011 0951   RBC 3.75* 10/31/2011 0951   HGB 11.1* 10/31/2011 0951   HCT 34.0* 10/31/2011 0951   PLT 203 10/31/2011 0951   MCV 90.7 10/31/2011 0951   MCH 29.6 10/31/2011 0951   MCHC 32.6 10/31/2011 0951   RDW 15.9* 10/31/2011 0951   LYMPHSABS 2.2 10/31/2011 0951   MONOABS 0.3 10/31/2011 0951   EOSABS 0.1 10/31/2011 0951   BASOSABS 0.0 10/31/2011 0951      Chemistry      Component Value Date/Time   NA 139 10/31/2011 0951   K 4.5 10/31/2011 0951   CL 103 10/31/2011 0951   CO2 29 10/31/2011 0951   BUN 13 10/31/2011 0951   CREATININE 0.91 10/31/2011 0951      Component Value Date/Time   CALCIUM 10.2 10/31/2011 0951   ALKPHOS 70 10/31/2011 0951   AST 25 10/31/2011 0951   ALT 32 10/31/2011 0951   BILITOT 0.2* 10/31/2011 0951       PATHOLOGY: 12/06/10  FINAL DIAGNOSIS  Diagnosis  1. 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 Ovary and fallopian tube, left  - OVARY WITH INVOLVEMENT BY ADENOCARCINOMA WITH MUCINOUS FEATURES  - BENIGN FALLOPIAN TUBE TISSUE  3. Ovary and fallopian tube, right  - OVARY WITH INVOLVEMENT BY ADENOCARCINOMA WITH MUCINOUS FEATURES  - BENIGN FALLOPIAN TUBE TISSUE  4. Uterus and cervix  - SEROSAL INVOLVEMENT BY METASTATIC ADENOCARCINOMA WITH MUCINOUS  FEATURES  - BENIGN PROLIFERATIVE ENDOMETRIUM ASSOCIATED WITH BENIGN ENDOMETRIAL  POLYP  - LEIOMYOMATA  - FOCAL SEROSAL ENDOMETRIOSIS  5. Omentum, resection for tumor  - INVOLVEMENT BY ADENOCARCINOMA WITH MUCINOUS FEATURES  Microscopic Comment  1. -5. The omentum, bilateral ovaries and uterine serosa show involvement by moderately  differenatiated  adenocarcinoma that in many areas displays abundant extracellular mucin. Immunohistochemical stains  performed on three representative blocks from omentum and both ovaries show that the tumor cells stain  as follows:  CDX-2 - positive  Cytokeratin 20 - positive  Cytokeratin 7 - negative  Estrogen receptor - negative  Progesterone receptor - negative (nonspecific cytoplasmic staining)  WT-1 - negative  In lieu of the previous history of right colonic mucinous adenocarcinoma, the histologic appearance as well  as the immunohistochemical profile, the overall findings are consistent with involvement by adenocarcinoma  from colonic primary. Clinical correlation is strongly recommended. (BNS:kh 12-09-10)  1 of 3  FINAL for MARSA, MATTEO ([id number])  Microscopic Comment(continued)  Guerry Bruin MD  Pathologist, Electronic Signature  (Case signed 12/09/2010)     ASSESSMENT:  1. Recurrent metastatic colon cancer. S/P 4 cycles of FOLFIRI + Avastin.  S/P 12 cycles of FOLFOX followed by a belly wash at [facility] on 08/08/11, which revealed a lesion on the dome of the liver unidentified on radiographic studies.  2. 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  3. Peripheral neuropathy, grade 1  4. GERD   PLAN:  1. Patient will be traveling to the [place] in the very near future.  She will be back in time for chemotherapy on 11/15/2011. 2. Pre-chemo lab work: CBC diff, UA 3. Monthly lab work: CMET, CEA 4. Restaging CT scans following her 6th cycle of chemotherapy: CT CAP with contrast. 5. Return in 4 weeks for follow-up.   All questions were answered. The patient knows to call the clinic with any problems, questions or concerns. We can certainly see the patient much sooner if necessary.  The patient and plan discussed with Glenford Peers, MD and he is in agreement with the aforementioned.   Berdena Cisek

## 2011-11-03 NOTE — Progress Notes (Signed)
Yolanda Friedman presented for Portacath access and flush. Proper placement of portacath confirmed by CXR. Portacath located rt chest wall accessed with  H 20 needle. Good blood return present. Portacath flushed with 20ml NS and 500U/50ml Heparin and needle removed intact. Procedure without incident. Patient tolerated procedure well.  Tolerated chemo well.

## 2011-11-03 NOTE — Patient Instructions (Signed)
[facility]  Discharge Instructions Yolanda Friedman  [id number] May 31, 1951 RECOMMENDATIONS MADE BY THE CONSULTANT AND ANY TEST RESULTS WILL BE SENT TO YOUR REFERRING DOCTOR.   EXAM FINDINGS BY MD TODAY AND SIGNS AND SYMPTOMS TO REPORT TO CLINIC OR PRIMARY MD: Exam findings as discussed by T. Jacalyn Lefevre, PA-C.  Return in 1 month for your office visit; continue your treatment as planned.  I acknowledge that I have been informed and understand all the instructions given to me and received a copy. I do not have any more questions at this time, but understand that I may call the Specialty Clinic at [facility] at [phone] during business hours should I have any further questions or need assistance in obtaining follow-up care.    __________________________________________  _____________  __________ Signature of Patient or Authorized Representative            Date                   Time    __________________________________________ Nurse's Signature

## 2011-11-15 ENCOUNTER — Encounter (HOSPITAL_COMMUNITY): Payer: Medicaid Other

## 2011-11-15 ENCOUNTER — Encounter (HOSPITAL_BASED_OUTPATIENT_CLINIC_OR_DEPARTMENT_OTHER): Payer: Medicaid Other

## 2011-11-15 VITALS — BP 140/92 | HR 89 | Temp 97.0°F | Wt 179.3 lb

## 2011-11-15 DIAGNOSIS — Z5111 Encounter for antineoplastic chemotherapy: Secondary | ICD-10-CM

## 2011-11-15 DIAGNOSIS — C787 Secondary malignant neoplasm of liver and intrahepatic bile duct: Secondary | ICD-10-CM

## 2011-11-15 DIAGNOSIS — C189 Malignant neoplasm of colon, unspecified: Secondary | ICD-10-CM

## 2011-11-15 LAB — DIFFERENTIAL
Basophils Absolute: 0 10*3/uL (ref 0.0–0.1)
Lymphocytes Relative: 46 % (ref 12–46)
Lymphs Abs: 1.5 10*3/uL (ref 0.7–4.0)
Neutro Abs: 1.4 10*3/uL — ABNORMAL LOW (ref 1.7–7.7)

## 2011-11-15 LAB — CBC
HCT: 30.6 % — ABNORMAL LOW (ref 36.0–46.0)
MCV: 90.5 fL (ref 78.0–100.0)
Platelets: 187 10*3/uL (ref 150–400)
RBC: 3.38 MIL/uL — ABNORMAL LOW (ref 3.87–5.11)
RDW: 17 % — ABNORMAL HIGH (ref 11.5–15.5)
WBC: 3.3 10*3/uL — ABNORMAL LOW (ref 4.0–10.5)

## 2011-11-15 LAB — COMPREHENSIVE METABOLIC PANEL
AST: 23 U/L (ref 0–37)
Albumin: 3.4 g/dL — ABNORMAL LOW (ref 3.5–5.2)
Calcium: 9.7 mg/dL (ref 8.4–10.5)
Chloride: 107 mEq/L (ref 96–112)
Creatinine, Ser: 0.68 mg/dL (ref 0.50–1.10)
Total Bilirubin: 0.2 mg/dL — ABNORMAL LOW (ref 0.3–1.2)
Total Protein: 6.5 g/dL (ref 6.0–8.3)

## 2011-11-15 LAB — URINALYSIS, DIPSTICK ONLY
Bilirubin Urine: NEGATIVE
Glucose, UA: NEGATIVE mg/dL
Hgb urine dipstick: NEGATIVE
Specific Gravity, Urine: 1.025 (ref 1.005–1.030)
pH: 5.5 (ref 5.0–8.0)

## 2011-11-15 MED ORDER — SODIUM CHLORIDE 0.9 % IV SOLN
2000.0000 mg/m2 | INTRAVENOUS | Status: DC
  Administered 2011-11-15: 3750 mg via INTRAVENOUS
  Filled 2011-11-15 (×2): qty 75

## 2011-11-15 MED ORDER — SODIUM CHLORIDE 0.9 % IV SOLN
Freq: Once | INTRAVENOUS | Status: AC
  Administered 2011-11-15: 16 mg via INTRAVENOUS
  Filled 2011-11-15: qty 8

## 2011-11-15 MED ORDER — DEXAMETHASONE SODIUM PHOSPHATE 4 MG/ML IJ SOLN: 20.0000 mg | Freq: Once | INTRAMUSCULAR | Status: DC

## 2011-11-15 MED ORDER — SODIUM CHLORIDE 0.9 % IV SOLN
INTRAVENOUS | Status: DC
  Administered 2011-11-15: 10:00:00 via INTRAVENOUS

## 2011-11-15 MED ORDER — LEUCOVORIN CALCIUM INJECTION 350 MG: 400.0000 mg/m2 | Freq: Once | INTRAVENOUS | Status: DC

## 2011-11-15 MED ORDER — SODIUM CHLORIDE 0.9 % IJ SOLN
10.0000 mL | INTRAMUSCULAR | Status: DC | PRN
  Administered 2011-11-15: 10 mL via INTRAVENOUS
  Filled 2011-11-15: qty 10

## 2011-11-15 MED ORDER — BEVACIZUMAB CHEMO INJECTION 400 MG/16ML
5.0000 mg/kg | Freq: Once | INTRAVENOUS | Status: AC
  Administered 2011-11-15: 425 mg via INTRAVENOUS
  Filled 2011-11-15: qty 17

## 2011-11-15 MED ORDER — LORAZEPAM 2 MG/ML IJ SOLN
1.0000 mg | Freq: Once | INTRAMUSCULAR | Status: AC
  Administered 2011-11-15: 1 mg via INTRAVENOUS
  Filled 2011-11-15: qty 0.5

## 2011-11-15 MED ORDER — SODIUM CHLORIDE 0.9 % IV SOLN: Freq: Once | INTRAVENOUS | Status: DC

## 2011-11-15 MED ORDER — FLUOROURACIL CHEMO INJECTION 2.5 GM/50ML
400.0000 mg/m2 | Freq: Once | INTRAVENOUS | Status: AC
  Administered 2011-11-15: 750 mg via INTRAVENOUS
  Filled 2011-11-15: qty 15

## 2011-11-15 MED ORDER — IRINOTECAN HCL CHEMO INJECTION 100 MG/5ML
150.0000 mg/m2 | Freq: Once | INTRAVENOUS | Status: AC
  Administered 2011-11-15: 282 mg via INTRAVENOUS
  Filled 2011-11-15: qty 14.1

## 2011-11-15 MED ORDER — SODIUM CHLORIDE 0.9 % IJ SOLN
INTRAMUSCULAR | Status: AC
  Administered 2011-11-15: 10 mL via INTRAVENOUS
  Filled 2011-11-15: qty 10

## 2011-11-15 MED ORDER — LEUCOVORIN CALCIUM INJECTION 100 MG
38.0000 mg | Freq: Once | INTRAMUSCULAR | Status: AC
  Administered 2011-11-15: 38 mg via INTRAVENOUS
  Filled 2011-11-15: qty 1.9

## 2011-11-15 MED ORDER — SODIUM CHLORIDE 0.9 % IV SOLN: 16.0000 mg | Freq: Once | INTRAVENOUS | Status: DC

## 2011-11-15 NOTE — Progress Notes (Signed)
1015 Ativan 1 mg given iv for c/o nausea 1035 Expressed relief from nausea. 1320 c/o nausea made worse with smell of lunch. 1325 Ativan 1 mg given iv. 1345 resting quietly. 1430 Tolerated chemo well.

## 2011-11-16 ENCOUNTER — Other Ambulatory Visit (HOSPITAL_COMMUNITY): Payer: Self-pay | Admitting: Oncology

## 2011-11-16 DIAGNOSIS — E119 Type 2 diabetes mellitus without complications: Secondary | ICD-10-CM

## 2011-11-16 MED ORDER — METFORMIN HCL 500 MG PO TABS: 500.0000 mg | ORAL_TABLET | Freq: Two times a day (BID) | ORAL | Status: DC

## 2011-11-17 ENCOUNTER — Encounter (HOSPITAL_BASED_OUTPATIENT_CLINIC_OR_DEPARTMENT_OTHER): Payer: Medicaid Other

## 2011-11-17 DIAGNOSIS — C787 Secondary malignant neoplasm of liver and intrahepatic bile duct: Secondary | ICD-10-CM

## 2011-11-17 DIAGNOSIS — Z452 Encounter for adjustment and management of vascular access device: Secondary | ICD-10-CM

## 2011-11-17 DIAGNOSIS — C189 Malignant neoplasm of colon, unspecified: Secondary | ICD-10-CM

## 2011-11-17 MED ORDER — SODIUM CHLORIDE 0.9 % IJ SOLN
INTRAMUSCULAR | Status: AC
  Filled 2011-11-17: qty 20

## 2011-11-17 MED ORDER — HEPARIN SOD (PORK) LOCK FLUSH 100 UNIT/ML IV SOLN
INTRAVENOUS | Status: AC
  Filled 2011-11-17: qty 5

## 2011-11-17 MED ORDER — HEPARIN SOD (PORK) LOCK FLUSH 100 UNIT/ML IV SOLN
500.0000 [IU] | Freq: Once | INTRAVENOUS | Status: AC | PRN
  Administered 2011-11-17: 500 [IU]
  Filled 2011-11-17: qty 5

## 2011-11-17 MED ORDER — SODIUM CHLORIDE 0.9 % IJ SOLN
10.0000 mL | INTRAMUSCULAR | Status: DC | PRN
  Administered 2011-11-17: 10 mL
  Filled 2011-11-17: qty 10

## 2011-11-20 ENCOUNTER — Telehealth (HOSPITAL_COMMUNITY): Payer: Self-pay | Admitting: Oncology

## 2011-11-20 ENCOUNTER — Other Ambulatory Visit (HOSPITAL_COMMUNITY): Payer: Self-pay | Admitting: Oncology

## 2011-11-20 MED ORDER — OMEPRAZOLE 20 MG PO CPDR: 20.0000 mg | DELAYED_RELEASE_CAPSULE | Freq: Two times a day (BID) | ORAL | Status: DC

## 2011-11-24 ENCOUNTER — Encounter (HOSPITAL_COMMUNITY): Payer: Self-pay

## 2011-11-24 ENCOUNTER — Telehealth (HOSPITAL_COMMUNITY): Payer: Self-pay

## 2011-11-24 ENCOUNTER — Other Ambulatory Visit (HOSPITAL_COMMUNITY): Payer: Self-pay | Admitting: Oncology

## 2011-11-24 DIAGNOSIS — C189 Malignant neoplasm of colon, unspecified: Secondary | ICD-10-CM

## 2011-11-24 MED ORDER — HYDROCODONE-ACETAMINOPHEN 10-325 MG PO TABS: 1.0000 | ORAL_TABLET | Freq: Four times a day (QID) | ORAL | Status: DC | PRN

## 2011-11-24 NOTE — Telephone Encounter (Signed)
Call to patient to instruct her to pick up prep for CT Scan on 4/1 when she comes for chemotherapy on 3/27.  Verbalizes understanding.  Patient is also complaining of pain in her right knee.  States "if I have been sitting for a long period and try to get up my knee hurts real bad and I can't hardly walk.  I'm out of my pain pill (Hydrocodone 10/325) and need a refill called in.  Also, the medicine that the PA gave me for my mouth is not helping.  My gums are so sore I'm having diffuculty wearing my dentures.  When I took the prescription to my pharmacy, they had to make a substitute because medicaid would not cover all of the ingredients."  Contacted [facility] and was told original rx was for [employer] and that medicaid would not cover the benadry and they called here and got the ok to use Nyastatin instead.

## 2011-11-24 NOTE — Telephone Encounter (Signed)
This could be from her dentures.  Does she have any visible sores?

## 2011-11-24 NOTE — Telephone Encounter (Signed)
Per patient she does not have any visible sores on her gums.  States " I don't wear my dentures while my mouth is sore. I only have a bottom plate."  Instructed patient to continue use of nystatin and to use biotene rinse.  If mouth gets worse to let us know on Monday.

## 2011-11-29 ENCOUNTER — Other Ambulatory Visit (HOSPITAL_COMMUNITY): Payer: Medicaid Other

## 2011-11-29 ENCOUNTER — Encounter (HOSPITAL_BASED_OUTPATIENT_CLINIC_OR_DEPARTMENT_OTHER): Payer: Medicaid Other

## 2011-11-29 VITALS — BP 119/75 | HR 84 | Temp 97.4°F | Ht 60.0 in | Wt 181.1 lb

## 2011-11-29 DIAGNOSIS — Z5111 Encounter for antineoplastic chemotherapy: Secondary | ICD-10-CM

## 2011-11-29 DIAGNOSIS — C7960 Secondary malignant neoplasm of unspecified ovary: Secondary | ICD-10-CM

## 2011-11-29 DIAGNOSIS — C787 Secondary malignant neoplasm of liver and intrahepatic bile duct: Secondary | ICD-10-CM

## 2011-11-29 DIAGNOSIS — C189 Malignant neoplasm of colon, unspecified: Secondary | ICD-10-CM

## 2011-11-29 LAB — COMPREHENSIVE METABOLIC PANEL
ALT: 25 U/L (ref 0–35)
BUN: 15 mg/dL (ref 6–23)
CO2: 26 mEq/L (ref 19–32)
Calcium: 9.9 mg/dL (ref 8.4–10.5)
Creatinine, Ser: 0.79 mg/dL (ref 0.50–1.10)
GFR calc Af Amer: >90 mL/min (ref >90)
GFR calc non Af Amer: 88 mL/min — ABNORMAL LOW (ref >90)
Glucose, Bld: 122 mg/dL — ABNORMAL HIGH (ref 70–99)

## 2011-11-29 LAB — CBC
HCT: 32.6 % — ABNORMAL LOW (ref 36.0–46.0)
MCH: 29.4 pg (ref 26.0–34.0)
MCV: 91.3 fL (ref 78.0–100.0)
RBC: 3.57 MIL/uL — ABNORMAL LOW (ref 3.87–5.11)
WBC: 3.5 10*3/uL — ABNORMAL LOW (ref 4.0–10.5)

## 2011-11-29 LAB — DIFFERENTIAL
Eosinophils Absolute: 0.1 10*3/uL (ref 0.0–0.7)
Eosinophils Relative: 1 % (ref 0–5)
Lymphocytes Relative: 45 % (ref 12–46)
Lymphs Abs: 1.6 10*3/uL (ref 0.7–4.0)
Monocytes Absolute: 0.3 10*3/uL (ref 0.1–1.0)
Monocytes Relative: 9 % (ref 3–12)

## 2011-11-29 LAB — URINALYSIS, DIPSTICK ONLY
Bilirubin Urine: NEGATIVE
Glucose, UA: NEGATIVE mg/dL
Ketones, ur: NEGATIVE mg/dL
Leukocytes, UA: NEGATIVE
pH: 6 (ref 5.0–8.0)

## 2011-11-29 MED ORDER — FLUOROURACIL CHEMO INJECTION 2.5 GM/50ML
400.0000 mg/m2 | Freq: Once | INTRAVENOUS | Status: AC
  Administered 2011-11-29: 750 mg via INTRAVENOUS
  Filled 2011-11-29: qty 15

## 2011-11-29 MED ORDER — SODIUM CHLORIDE 0.9 % IJ SOLN
10.0000 mL | INTRAMUSCULAR | Status: DC | PRN
  Filled 2011-11-29: qty 10

## 2011-11-29 MED ORDER — LEUCOVORIN CALCIUM INJECTION 100 MG
20.0000 mg/m2 | Freq: Once | INTRAMUSCULAR | Status: AC
  Administered 2011-11-29: 38 mg via INTRAVENOUS
  Filled 2011-11-29: qty 1.9

## 2011-11-29 MED ORDER — DEXAMETHASONE SODIUM PHOSPHATE 4 MG/ML IJ SOLN: 20.0000 mg | Freq: Once | INTRAMUSCULAR | Status: DC

## 2011-11-29 MED ORDER — SODIUM CHLORIDE 0.9 % IV SOLN: 16.0000 mg | Freq: Once | INTRAVENOUS | Status: DC

## 2011-11-29 MED ORDER — SODIUM CHLORIDE 0.9 % IV SOLN
Freq: Once | INTRAVENOUS | Status: AC
  Administered 2011-11-29: 10:00:00 via INTRAVENOUS

## 2011-11-29 MED ORDER — SODIUM CHLORIDE 0.9 % IV SOLN
Freq: Once | INTRAVENOUS | Status: AC
  Administered 2011-11-29: 16 mg via INTRAVENOUS
  Filled 2011-11-29: qty 8

## 2011-11-29 MED ORDER — SODIUM CHLORIDE 0.9 % IV SOLN
2000.0000 mg/m2 | INTRAVENOUS | Status: DC
  Administered 2011-11-29: 3750 mg via INTRAVENOUS
  Filled 2011-11-29 (×2): qty 75

## 2011-11-29 MED ORDER — LEUCOVORIN CALCIUM INJECTION 350 MG: 400.0000 mg/m2 | Freq: Once | INTRAVENOUS | Status: DC

## 2011-11-29 MED ORDER — IRINOTECAN HCL CHEMO INJECTION 100 MG/5ML
150.0000 mg/m2 | Freq: Once | INTRAVENOUS | Status: AC
  Administered 2011-11-29: 282 mg via INTRAVENOUS
  Filled 2011-11-29: qty 14.1

## 2011-11-29 MED ORDER — SODIUM CHLORIDE 0.9 % IV SOLN
5.0000 mg/kg | Freq: Once | INTRAVENOUS | Status: AC
  Administered 2011-11-29: 425 mg via INTRAVENOUS
  Filled 2011-11-29: qty 17

## 2011-11-29 MED ORDER — HEPARIN SOD (PORK) LOCK FLUSH 100 UNIT/ML IV SOLN
500.0000 [IU] | Freq: Once | INTRAVENOUS | Status: DC | PRN
  Filled 2011-11-29: qty 5

## 2011-11-29 NOTE — Progress Notes (Signed)
Tolerated well

## 2011-11-30 ENCOUNTER — Other Ambulatory Visit (HOSPITAL_COMMUNITY): Payer: Self-pay | Admitting: Oncology

## 2011-11-30 DIAGNOSIS — C189 Malignant neoplasm of colon, unspecified: Secondary | ICD-10-CM

## 2011-11-30 MED ORDER — NYSTATIN 100000 UNIT/ML MT SUSP: 500000.0000 [IU] | Freq: Four times a day (QID) | OROMUCOSAL | Status: DC

## 2011-12-01 ENCOUNTER — Encounter (HOSPITAL_BASED_OUTPATIENT_CLINIC_OR_DEPARTMENT_OTHER): Payer: Medicaid Other

## 2011-12-01 VITALS — BP 121/77 | HR 90 | Temp 97.0°F

## 2011-12-01 DIAGNOSIS — C189 Malignant neoplasm of colon, unspecified: Secondary | ICD-10-CM

## 2011-12-01 DIAGNOSIS — Z452 Encounter for adjustment and management of vascular access device: Secondary | ICD-10-CM

## 2011-12-01 DIAGNOSIS — C787 Secondary malignant neoplasm of liver and intrahepatic bile duct: Secondary | ICD-10-CM

## 2011-12-01 MED ORDER — HEPARIN SOD (PORK) LOCK FLUSH 100 UNIT/ML IV SOLN
500.0000 [IU] | Freq: Once | INTRAVENOUS | Status: AC | PRN
  Administered 2011-12-01: 500 [IU]
  Filled 2011-12-01: qty 5

## 2011-12-01 MED ORDER — HEPARIN SOD (PORK) LOCK FLUSH 100 UNIT/ML IV SOLN
INTRAVENOUS | Status: AC
  Filled 2011-12-01: qty 5

## 2011-12-01 MED ORDER — SODIUM CHLORIDE 0.9 % IJ SOLN
10.0000 mL | INTRAMUSCULAR | Status: DC | PRN
  Administered 2011-12-01: 10 mL
  Filled 2011-12-01: qty 10

## 2011-12-01 MED ORDER — SODIUM CHLORIDE 0.9 % IJ SOLN
INTRAMUSCULAR | Status: AC
  Filled 2011-12-01: qty 10

## 2011-12-04 ENCOUNTER — Encounter (HOSPITAL_COMMUNITY): Payer: Self-pay

## 2011-12-04 ENCOUNTER — Ambulatory Visit (HOSPITAL_COMMUNITY)
Admission: RE | Admit: 2011-12-04 | Discharge: 2011-12-04 | Disposition: A | Discharge status: Home / Self Care | Payer: Medicaid Other | Source: Ambulatory Visit | Attending: Oncology | Admitting: Oncology

## 2011-12-04 DIAGNOSIS — C189 Malignant neoplasm of colon, unspecified: Secondary | ICD-10-CM | POA: Insufficient documentation

## 2011-12-04 DIAGNOSIS — Z9049 Acquired absence of other specified parts of digestive tract: Secondary | ICD-10-CM | POA: Insufficient documentation

## 2011-12-04 DIAGNOSIS — R911 Solitary pulmonary nodule: Secondary | ICD-10-CM | POA: Insufficient documentation

## 2011-12-04 MED ORDER — IOHEXOL 300 MG/ML  SOLN
100.0000 mL | Freq: Once | INTRAMUSCULAR | Status: AC | PRN
  Administered 2011-12-04: 100 mL via INTRAVENOUS

## 2011-12-05 ENCOUNTER — Ambulatory Visit (HOSPITAL_COMMUNITY): Payer: Medicaid Other | Admitting: Oncology

## 2011-12-07 ENCOUNTER — Encounter (HOSPITAL_COMMUNITY): Payer: Medicaid Other | Attending: Oncology | Admitting: Oncology

## 2011-12-07 ENCOUNTER — Encounter (HOSPITAL_COMMUNITY): Payer: Self-pay | Admitting: Oncology

## 2011-12-07 VITALS — BP 123/79 | HR 73 | Temp 98.1°F | Wt 182.3 lb

## 2011-12-07 DIAGNOSIS — C189 Malignant neoplasm of colon, unspecified: Secondary | ICD-10-CM | POA: Insufficient documentation

## 2011-12-07 DIAGNOSIS — R110 Nausea: Secondary | ICD-10-CM | POA: Insufficient documentation

## 2011-12-07 DIAGNOSIS — C787 Secondary malignant neoplasm of liver and intrahepatic bile duct: Secondary | ICD-10-CM

## 2011-12-07 DIAGNOSIS — G608 Other hereditary and idiopathic neuropathies: Secondary | ICD-10-CM

## 2011-12-07 MED ORDER — MAGIC MOUTHWASH W/LIDOCAINE: 10.0000 mL | Freq: Four times a day (QID) | ORAL | Status: DC | PRN

## 2011-12-07 NOTE — Progress Notes (Signed)
Yolanda An, MD, MD [name] [address]  1. Metastatic recurrent adenocarcinoma of colon  Alum & Mag Hydroxide-Simeth (MAGIC MOUTHWASH W/LIDOCAINE) SOLN    CURRENT THERAPY:S/P 6 cycles of FOLFIRI + Avastin. S/P Belly wash at [facility]. S/P 12 cycles of FOLFOX + Avastin finishing on 06/27/2011.   INTERVAL HISTORY: Yolanda Friedman 61 y.o. female returns for  regular  visit for followup of Metastatic recurrent colon cancer.  I personally reviewed and went over radiographic studies with the patient. The patient's restaging CT scans reveal stability. We spent time going over each reports line by line of her CT of chest and her abdomen and pelvis. The patient was lately discouraged by the fact that not all disease is gone. I spent to the patient that in light of her stage IV disease our goal is stability and/or remission.  We spent some time discussing her disease following reviewing the CAT scan reports. I explained to the patient that she is recurrent, metastatic, stage IV disease. She was shocked that she had stage IV disease. She reports that she may have been told this in the past but "blocked it out of my mind because I was busy with other things." She questions me on how stage IV disease is treated. I explained to her that we will continue with chemotherapy. She asks me, "so, you mean I will be getting a chemotherapy forever?"  I responded by validating that statement and explaining that, yes she will be getting chemotherapy for as long as the patient is willing, and as long as her counts/bone marrow allow [place]. The patient was extremely discouraged by this news. She became tearful. I did explain to her that occasionally we can provide the patient with breaks in therapy. She is interested in a break in the future. I've encouraged her to continue with 6 more cycles of chemotherapy and then we will restage her with subsequent CT scans. At that point in time, we may wish  to discuss a break in therapy. I'll defer this to Dr. Mariel Sleet.  The patient also admits to mouth pain. She explains the nystatin solution was ineffective. She shows me 1 area on the left buccal mucosa where there is a small fissure. Her other pain she reports is deep within the lower count where her dentures are. She denies any visible sores or lesions. There is no redness according to the patient. So what I propose is that we've given her some Magic swizzle which will contain lidocaine. I to hold her that this lesion will be symptomatic management and will only resolve her soreness for a short period of time approximately 5-15 minutes. During this time I've encouraged her to consume her meals. I told her to take the Magic swizzle just prior to being 2 make consumption of food more manageable. If this does not resolve within the next 3-4 weeks, I've encouraged her to followup with a dentist.  Otherwise, the patient is tolerating chemotherapy well. She does report approximately one week's worth of nausea that is well controlled with her anti-emetics following chemotherapy. She denies any vomiting or diarrhea.  ROS: No TIA's or unusual headaches, no dysphagia.  No prolonged cough. No dyspnea or chest pain on exertion.  No abdominal pain, change in bowel habits, black or bloody stools.  No urinary tract symptoms.  No new or unusual musculoskeletal symptoms.    Past Medical History  Diagnosis Date  . Hypertension   . Cancer   .  Colon cancer 2005    metastatic 2012  . 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 Diabetes mellitus     has Metastatic recurrent adenocarcinoma of colon on her problem list.      has no known allergies.  [name] does not currently have medications on file.  Past Surgical History  Procedure Date  . Cholecystectomy 1987  . Colectomy 2005    colon ca  . Abdominal hysterectomy 12/20/2010    met colon ca  . Portacath placement 01/2011    Denies any  headaches, dizziness, double vision, fevers, chills, night sweats, nausea, vomiting, diarrhea, constipation, chest pain, heart palpitations, shortness of breath, blood in stool, black tarry stool, urinary pain, urinary burning, urinary frequency, hematuria.   PHYSICAL EXAMINATION  ECOG PERFORMANCE STATUS: 1 - Symptomatic but completely ambulatory  Filed Vitals:   12/07/11 1006  BP: 123/79  Pulse: 73  Temp: 98.1 F (36.7 C)    GENERAL:alert, no distress, well nourished, well developed, comfortable, cooperative and obese SKIN: skin color, texture, turgor are normal, no rashes or significant lesions HEAD: Normocephalic, No masses, lesions, tenderness or abnormalities EYES: normal, EOMI, Conjunctiva are pink and non-injected EARS: External ears normal OROPHARYNX: Left buccal mucosa fissure. Otherwise unremarkable. No areas of erythema, lesion, sores, ulcers, or white plaques. NECK: supple, trachea midline LYMPH:  not examined BREAST:not examined LUNGS: clear to auscultation and percussion HEART: regular rate & rhythm, no murmurs, no gallops, S1 normal and S2 normal ABDOMEN:abdomen soft, non-tender and normal bowel sounds BACK: Back symmetric, no curvature., No CVA tenderness EXTREMITIES:less then 2 second capillary refill, no joint deformities, effusion, or inflammation, no edema, no skin discoloration, no clubbing, no cyanosis  NEURO: alert & oriented x 3 with fluent speech, no focal motor/sensory deficits, gait normal    LABORATORY DATA: CBC    Component Value Date/Time   WBC 3.5* 11/29/2011 0950   RBC 3.57* 11/29/2011 0950   HGB 10.5* 11/29/2011 0950   HCT 32.6* 11/29/2011 0950   PLT 198 11/29/2011 0950   MCV 91.3 11/29/2011 0950   MCH 29.4 11/29/2011 0950   MCHC 32.2 11/29/2011 0950   RDW 17.7* 11/29/2011 0950   LYMPHSABS 1.6 11/29/2011 0950   MONOABS 0.3 11/29/2011 0950   EOSABS 0.1 11/29/2011 0950   BASOSABS 0.0 11/29/2011 0950      Chemistry      Component Value Date/Time     NA 136 11/29/2011 0950   K 3.8 11/29/2011 0950   CL 101 11/29/2011 0950   CO2 26 11/29/2011 0950   BUN 15 11/29/2011 0950   CREATININE 0.79 11/29/2011 0950      Component Value Date/Time   CALCIUM 9.9 11/29/2011 0950   ALKPHOS 66 11/29/2011 0950   AST 23 11/29/2011 0950   ALT 25 11/29/2011 0950   BILITOT 0.3 11/29/2011 0950       RADIOGRAPHIC STUDIES:  12/05/2011  *RADIOLOGY REPORT*  Clinical Data: Recurrent metastatic colon cancer  CT CHEST, ABDOMEN AND PELVIS WITH CONTRAST  Technique: Multidetector CT imaging of the chest, abdomen and  pelvis was performed following the standard protocol during bolus  administration of intravenous contrast.  Contrast: OMNIPAQUE IOHEXOL 300 MG/ML IJ SOLN  Comparison: None.  CT CHEST  Findings: No enlarged axillary or supraclavicular lymph nodes.  There is no enlarged mediastinal or hilar adenopathy.  Calcifications involving the LAD coronary artery noted.  There is no pericardial or pleural effusion identified.  Pulmonary nodule within the superior segment of the right lower  lobe measures 4.3 mm, image 24. Unchanged from previous exam.  Review of the visualized osseous structures is significant for mild  degenerative disc disease.  No aggressive bone lesions identified.  IMPRESSION:  1. No mass or adenopathy.  2. Stable tiny nodule in the right lung.  CT ABDOMEN AND PELVIS  Findings:  Cyst within the left hepatic lobe measures 6.5 mm, image 45. This  is unchanged from previous exam.  Prior cholecystectomy.  Small amount of pneumobilia is identified consistent with biliary  patency. The pancreas is normal.  Both adrenal glands are normal. Tiny hypodensity arising from the  inferior pole the left kidney measures 4 mm. Unchanged from  previous exam. The left kidney appears normal.  Partially calcified low-density mass posterior to the antrum of the  stomach is again identified. This currently measures 2.6 x 4.2 cm,  image 64. This is  not significantly changed when compared with  previous exam.  Periaortic lymph node measures 0.7 cm, image 70. This is stable  when compared with previous exam.  Nodularity along the right pericolic gutter at the level of the  iliac crests is again noted. This measures up to 1.3 cm in  thickness, image 84. Not significantly changed from previous exam.  No enlarged or enlarging pelvic or inguinal lymph nodes. Urinary  bladder appears normal.  Several small nodules are identified along the undersurface of the  ventral abdominal wall, image number 67 and 66. These appears  similar to previous exam.  There is no clear progression of peritoneal disease at this time.  The stomach and small bowel loops have a normal course and caliber  without evidence for obstruction.  Prior right hemicolectomy with enterocolonic anastomoses.  IMPRESSION:  1. Stable CT of the abdomen and pelvis. No specific features  identified to suggest progression of disease.  2. The low density and partially calcified mass within the upper  abdomen posterior to the gastric antrum is stable from previous  exam.  3. Mild peritoneal nodularity along the right pericolic gutter and  undersurface of the ventral abdominal wall is stable.  Original Report Authenticated By: Rosealee Albee, M.D.   PATHOLOGY: 12/06/10  FINAL DIAGNOSIS  Diagnosis  1. 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 Ovary and fallopian tube, left  - OVARY WITH INVOLVEMENT BY ADENOCARCINOMA WITH MUCINOUS FEATURES  - BENIGN FALLOPIAN TUBE TISSUE  3. Ovary and fallopian tube, right  - OVARY WITH INVOLVEMENT BY ADENOCARCINOMA WITH MUCINOUS FEATURES  - BENIGN FALLOPIAN TUBE TISSUE  4. Uterus and cervix  - SEROSAL INVOLVEMENT BY METASTATIC ADENOCARCINOMA WITH MUCINOUS  FEATURES  - BENIGN PROLIFERATIVE ENDOMETRIUM ASSOCIATED WITH BENIGN ENDOMETRIAL  POLYP  - LEIOMYOMATA  - FOCAL SEROSAL ENDOMETRIOSIS  5. Omentum,  resection for tumor  - INVOLVEMENT BY ADENOCARCINOMA WITH MUCINOUS FEATURES  Microscopic Comment  1. -5. The omentum, bilateral ovaries and uterine serosa show involvement by moderately differenatiated  adenocarcinoma that in many areas displays abundant extracellular mucin. Immunohistochemical stains  performed on three representative blocks from omentum and both ovaries show that the tumor cells stain  as follows:  CDX-2 - positive  Cytokeratin 20 - positive  Cytokeratin 7 - negative  Estrogen receptor - negative  Progesterone receptor - negative (nonspecific cytoplasmic staining)  WT-1 - negative  In lieu of the previous history of right colonic mucinous adenocarcinoma, the histologic appearance as well  as the immunohistochemical profile, the overall findings are consistent with involvement by adenocarcinoma  from colonic  primary. Clinical correlation is strongly recommended. (BNS:kh 12-09-10)  1 of 3  FINAL for LETRICE, POLLOK ([id number])  Microscopic Comment(continued)  Guerry Bruin MD  Pathologist, Electronic Signature  (Case signed 12/09/2010)     ASSESSMENT:  1. Recurrent metastatic colon cancer. S/P 6 cycles of FOLFIRI + Avastin. S/P 12 cycles of FOLFOX followed by a belly wash at [facility] on 08/08/11, which revealed a lesion on the dome of the liver unidentified on radiographic studies.  2. 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  3. Peripheral neuropathy, grade 1  4. GERD 5. Mouth discomfort, unresolved with Nystatin   PLAN:  1. Magic Swizzle 300 mL with 1 refill.  She may swish and swallow 5 mL four times daily PRN as needed for mouth discomfort.  2. If mouth discomfort does not improve over the next 3-4 weeks, recommend dental consultation. 3. I personally reviewed and went over radiographic studies with the patient. 4. Long discussion about her disease, stage, and treatment. 5. Return for chemotherapy as scheduled. 6. Following 6 more treatments, will restage and  discuss the potential for a break in therapy per patient's request. 7. Patient requests that I call her sister regarding the CT scan results. I will call Lyla Son at [phone]. A message was left with Lyla Son. 8. Return in 1 month for follow-up.  All questions were answered. The patient knows to call the clinic with any problems, questions or concerns. We can certainly see the patient much sooner if necessary.   Majesty Stehlin

## 2011-12-08 ENCOUNTER — Other Ambulatory Visit (HOSPITAL_COMMUNITY): Payer: Self-pay | Admitting: Oncology

## 2011-12-13 ENCOUNTER — Encounter (HOSPITAL_BASED_OUTPATIENT_CLINIC_OR_DEPARTMENT_OTHER): Payer: Medicaid Other

## 2011-12-13 ENCOUNTER — Other Ambulatory Visit (HOSPITAL_COMMUNITY): Payer: Medicaid Other

## 2011-12-13 VITALS — BP 118/81 | HR 96 | Temp 97.9°F | Wt 179.6 lb

## 2011-12-13 DIAGNOSIS — C787 Secondary malignant neoplasm of liver and intrahepatic bile duct: Secondary | ICD-10-CM

## 2011-12-13 DIAGNOSIS — C189 Malignant neoplasm of colon, unspecified: Secondary | ICD-10-CM

## 2011-12-13 DIAGNOSIS — Z5111 Encounter for antineoplastic chemotherapy: Secondary | ICD-10-CM

## 2011-12-13 DIAGNOSIS — C7960 Secondary malignant neoplasm of unspecified ovary: Secondary | ICD-10-CM

## 2011-12-13 LAB — DIFFERENTIAL
Basophils Absolute: 0 10*3/uL (ref 0.0–0.1)
Basophils Relative: 0 % (ref 0–1)
Eosinophils Absolute: 0.1 10*3/uL (ref 0.0–0.7)
Eosinophils Relative: 1 % (ref 0–5)
Lymphocytes Relative: 45 % (ref 12–46)
Lymphs Abs: 1.8 10*3/uL (ref 0.7–4.0)
Monocytes Absolute: 0.3 10*3/uL (ref 0.1–1.0)
Monocytes Relative: 8 % (ref 3–12)
Neutro Abs: 1.8 10*3/uL (ref 1.7–7.7)
Neutrophils Relative %: 45 % (ref 43–77)

## 2011-12-13 LAB — COMPREHENSIVE METABOLIC PANEL
Albumin: 3.5 g/dL (ref 3.5–5.2)
Alkaline Phosphatase: 64 U/L (ref 39–117)
BUN: 13 mg/dL (ref 6–23)
CO2: 27 mEq/L (ref 19–32)
Chloride: 101 mEq/L (ref 96–112)
Creatinine, Ser: 0.87 mg/dL (ref 0.50–1.10)
GFR calc non Af Amer: 70 mL/min — ABNORMAL LOW (ref >90)
Potassium: 4.3 mEq/L (ref 3.5–5.1)
Total Bilirubin: 0.3 mg/dL (ref 0.3–1.2)

## 2011-12-13 LAB — CBC
HCT: 33 % — ABNORMAL LOW (ref 36.0–46.0)
Hemoglobin: 10.3 g/dL — ABNORMAL LOW (ref 12.0–15.0)
MCH: 28.6 pg (ref 26.0–34.0)
MCHC: 31.2 g/dL (ref 30.0–36.0)
MCV: 91.7 fL (ref 78.0–100.0)
Platelets: 200 10*3/uL (ref 150–400)
RBC: 3.6 MIL/uL — ABNORMAL LOW (ref 3.87–5.11)
RDW: 18.8 % — ABNORMAL HIGH (ref 11.5–15.5)
WBC: 4.1 10*3/uL (ref 4.0–10.5)

## 2011-12-13 MED ORDER — SODIUM CHLORIDE 0.9 % IV SOLN
Freq: Once | INTRAVENOUS | Status: AC
  Administered 2011-12-13: 16 mg via INTRAVENOUS
  Filled 2011-12-13: qty 8

## 2011-12-13 MED ORDER — LEUCOVORIN CALCIUM INJECTION 350 MG
20.0000 mg/m2 | Freq: Once | INTRAMUSCULAR | Status: AC
  Administered 2011-12-13: 38 mg via INTRAVENOUS
  Filled 2011-12-13: qty 1.9

## 2011-12-13 MED ORDER — IRINOTECAN HCL CHEMO INJECTION 100 MG/5ML
150.0000 mg/m2 | Freq: Once | INTRAVENOUS | Status: AC
  Administered 2011-12-13: 282 mg via INTRAVENOUS
  Filled 2011-12-13: qty 14.1

## 2011-12-13 MED ORDER — SODIUM CHLORIDE 0.9 % IV SOLN
Freq: Once | INTRAVENOUS | Status: AC
  Administered 2011-12-13: 10:00:00 via INTRAVENOUS

## 2011-12-13 MED ORDER — SODIUM CHLORIDE 0.9 % IV SOLN
5.0000 mg/kg | Freq: Once | INTRAVENOUS | Status: AC
  Administered 2011-12-13: 425 mg via INTRAVENOUS
  Filled 2011-12-13: qty 17

## 2011-12-13 MED ORDER — SODIUM CHLORIDE 0.9 % IV SOLN: 16.0000 mg | Freq: Once | INTRAVENOUS | Status: DC

## 2011-12-13 MED ORDER — DEXAMETHASONE SODIUM PHOSPHATE 4 MG/ML IJ SOLN: 20.0000 mg | Freq: Once | INTRAMUSCULAR | Status: DC

## 2011-12-13 MED ORDER — FLUOROURACIL CHEMO INJECTION 2.5 GM/50ML
400.0000 mg/m2 | Freq: Once | INTRAVENOUS | Status: AC
  Administered 2011-12-13: 750 mg via INTRAVENOUS
  Filled 2011-12-13: qty 15

## 2011-12-13 MED ORDER — HEPARIN SOD (PORK) LOCK FLUSH 100 UNIT/ML IV SOLN
500.0000 [IU] | Freq: Once | INTRAVENOUS | Status: DC | PRN
  Filled 2011-12-13: qty 5

## 2011-12-13 MED ORDER — SODIUM CHLORIDE 0.9 % IV SOLN
2000.0000 mg/m2 | INTRAVENOUS | Status: DC
  Administered 2011-12-13: 3750 mg via INTRAVENOUS
  Filled 2011-12-13 (×2): qty 75

## 2011-12-13 MED ORDER — SODIUM CHLORIDE 0.9 % IJ SOLN
10.0000 mL | INTRAMUSCULAR | Status: DC | PRN
Start: 2011-12-13 — End: 2011-12-13
  Filled 2011-12-13: qty 10

## 2011-12-15 ENCOUNTER — Encounter (HOSPITAL_BASED_OUTPATIENT_CLINIC_OR_DEPARTMENT_OTHER): Payer: Medicaid Other

## 2011-12-15 DIAGNOSIS — Z452 Encounter for adjustment and management of vascular access device: Secondary | ICD-10-CM

## 2011-12-15 DIAGNOSIS — C787 Secondary malignant neoplasm of liver and intrahepatic bile duct: Secondary | ICD-10-CM

## 2011-12-15 DIAGNOSIS — C189 Malignant neoplasm of colon, unspecified: Secondary | ICD-10-CM

## 2011-12-15 MED ORDER — HEPARIN SOD (PORK) LOCK FLUSH 100 UNIT/ML IV SOLN
INTRAVENOUS | Status: AC
  Filled 2011-12-15: qty 5

## 2011-12-15 MED ORDER — SODIUM CHLORIDE 0.9 % IJ SOLN
INTRAMUSCULAR | Status: AC
  Filled 2011-12-15: qty 20

## 2011-12-15 MED ORDER — SODIUM CHLORIDE 0.9 % IJ SOLN
10.0000 mL | INTRAMUSCULAR | Status: DC | PRN
  Filled 2011-12-15: qty 10

## 2011-12-15 MED ORDER — HEPARIN SOD (PORK) LOCK FLUSH 100 UNIT/ML IV SOLN
500.0000 [IU] | Freq: Once | INTRAVENOUS | Status: DC
  Filled 2011-12-15: qty 5

## 2011-12-15 MED ORDER — SODIUM CHLORIDE 0.9 % IJ SOLN
20.0000 mL | INTRAMUSCULAR | Status: DC | PRN
  Administered 2011-12-15: 20 mL via INTRAVENOUS
  Filled 2011-12-15: qty 20

## 2011-12-15 MED ORDER — HEPARIN SOD (PORK) LOCK FLUSH 100 UNIT/ML IV SOLN
500.0000 [IU] | Freq: Once | INTRAVENOUS | Status: AC
  Administered 2011-12-15: 500 [IU] via INTRAVENOUS
  Filled 2011-12-15: qty 5

## 2011-12-22 ENCOUNTER — Other Ambulatory Visit (HOSPITAL_COMMUNITY): Payer: Self-pay | Admitting: Oncology

## 2011-12-22 ENCOUNTER — Encounter (HOSPITAL_COMMUNITY): Payer: Self-pay | Admitting: Oncology

## 2011-12-22 DIAGNOSIS — I10 Essential (primary) hypertension: Secondary | ICD-10-CM | POA: Insufficient documentation

## 2011-12-22 DIAGNOSIS — C189 Malignant neoplasm of colon, unspecified: Secondary | ICD-10-CM

## 2011-12-22 HISTORY — DX: Essential (primary) hypertension: I10

## 2011-12-22 MED ORDER — ONDANSETRON HCL 8 MG PO TABS: 8.0000 mg | ORAL_TABLET | Freq: Three times a day (TID) | ORAL | Status: DC | PRN

## 2011-12-22 MED ORDER — POTASSIUM CHLORIDE ER 10 MEQ PO TBCR: 20.0000 meq | EXTENDED_RELEASE_TABLET | Freq: Two times a day (BID) | ORAL | Status: DC

## 2011-12-22 MED ORDER — AMLODIPINE BESYLATE-VALSARTAN 5-160 MG PO TABS: 1.0000 | ORAL_TABLET | Freq: Every day | ORAL | Status: DC

## 2011-12-22 MED ORDER — HYDROCODONE-ACETAMINOPHEN 10-325 MG PO TABS: 1.0000 | ORAL_TABLET | Freq: Four times a day (QID) | ORAL | Status: DC | PRN

## 2011-12-22 MED ORDER — LORAZEPAM 1 MG PO TABS: 1.0000 mg | ORAL_TABLET | ORAL | Status: DC | PRN

## 2011-12-22 NOTE — Progress Notes (Signed)
Labs drawn

## 2011-12-27 ENCOUNTER — Encounter (HOSPITAL_BASED_OUTPATIENT_CLINIC_OR_DEPARTMENT_OTHER): Payer: Medicaid Other

## 2011-12-27 DIAGNOSIS — R110 Nausea: Secondary | ICD-10-CM

## 2011-12-27 DIAGNOSIS — C7960 Secondary malignant neoplasm of unspecified ovary: Secondary | ICD-10-CM

## 2011-12-27 DIAGNOSIS — C787 Secondary malignant neoplasm of liver and intrahepatic bile duct: Secondary | ICD-10-CM

## 2011-12-27 DIAGNOSIS — Z5111 Encounter for antineoplastic chemotherapy: Secondary | ICD-10-CM

## 2011-12-27 DIAGNOSIS — C189 Malignant neoplasm of colon, unspecified: Secondary | ICD-10-CM

## 2011-12-27 LAB — COMPREHENSIVE METABOLIC PANEL
ALT: 19 U/L (ref 0–35)
Albumin: 3.6 g/dL (ref 3.5–5.2)
Alkaline Phosphatase: 62 U/L (ref 39–117)
BUN: 13 mg/dL (ref 6–23)
Calcium: 9.8 mg/dL (ref 8.4–10.5)
GFR calc Af Amer: >90 mL/min (ref >90)
Glucose, Bld: 143 mg/dL — ABNORMAL HIGH (ref 70–99)
Potassium: 3.4 mEq/L — ABNORMAL LOW (ref 3.5–5.1)
Sodium: 140 mEq/L (ref 135–145)
Total Protein: 6.7 g/dL (ref 6.0–8.3)

## 2011-12-27 LAB — DIFFERENTIAL
Eosinophils Relative: 1 % (ref 0–5)
Lymphocytes Relative: 37 % (ref 12–46)
Monocytes Absolute: 0.5 10*3/uL (ref 0.1–1.0)
Monocytes Relative: 12 % (ref 3–12)
Neutro Abs: 2.2 10*3/uL (ref 1.7–7.7)

## 2011-12-27 LAB — URINALYSIS, DIPSTICK ONLY
Bilirubin Urine: NEGATIVE
Glucose, UA: 100 mg/dL — AB
Hgb urine dipstick: NEGATIVE
Ketones, ur: NEGATIVE mg/dL
Protein, ur: NEGATIVE mg/dL
Urobilinogen, UA: 0.2 mg/dL (ref 0.0–1.0)

## 2011-12-27 LAB — CBC
HCT: 30.6 % — ABNORMAL LOW (ref 36.0–46.0)
Hemoglobin: 9.8 g/dL — ABNORMAL LOW (ref 12.0–15.0)
MCV: 91.3 fL (ref 78.0–100.0)
WBC: 4.4 10*3/uL (ref 4.0–10.5)

## 2011-12-27 MED ORDER — SODIUM CHLORIDE 0.9 % IV SOLN
5.0000 mg/kg | Freq: Once | INTRAVENOUS | Status: AC
  Administered 2011-12-27: 425 mg via INTRAVENOUS
  Filled 2011-12-27: qty 17

## 2011-12-27 MED ORDER — LORAZEPAM 2 MG/ML IJ SOLN
INTRAMUSCULAR | Status: AC
  Filled 2011-12-27: qty 1

## 2011-12-27 MED ORDER — DEXAMETHASONE SODIUM PHOSPHATE 4 MG/ML IJ SOLN: 20.0000 mg | Freq: Once | INTRAMUSCULAR | Status: DC

## 2011-12-27 MED ORDER — SODIUM CHLORIDE 0.9 % IV SOLN
2000.0000 mg/m2 | INTRAVENOUS | Status: DC
  Administered 2011-12-27: 3750 mg via INTRAVENOUS
  Filled 2011-12-27 (×2): qty 75

## 2011-12-27 MED ORDER — LEUCOVORIN CALCIUM INJECTION 350 MG
20.0000 mg/m2 | Freq: Once | INTRAMUSCULAR | Status: AC
  Administered 2011-12-27: 38 mg via INTRAVENOUS
  Filled 2011-12-27: qty 1.9

## 2011-12-27 MED ORDER — SODIUM CHLORIDE 0.9 % IV SOLN
Freq: Once | INTRAVENOUS | Status: AC
  Administered 2011-12-27: 11:00:00 via INTRAVENOUS

## 2011-12-27 MED ORDER — SODIUM CHLORIDE 0.9 % IV SOLN
Freq: Once | INTRAVENOUS | Status: AC
  Administered 2011-12-27: 16 mg via INTRAVENOUS
  Filled 2011-12-27: qty 8

## 2011-12-27 MED ORDER — FLUOROURACIL CHEMO INJECTION 2.5 GM/50ML
400.0000 mg/m2 | Freq: Once | INTRAVENOUS | Status: AC
  Administered 2011-12-27: 750 mg via INTRAVENOUS
  Filled 2011-12-27: qty 15

## 2011-12-27 MED ORDER — HYDROCODONE-ACETAMINOPHEN 5-325 MG PO TABS
2.0000 | ORAL_TABLET | Freq: Four times a day (QID) | ORAL | Status: DC | PRN
  Administered 2011-12-27: 2 via ORAL
  Filled 2011-12-27: qty 2

## 2011-12-27 MED ORDER — SODIUM CHLORIDE 0.9 % IV SOLN: 16.0000 mg | Freq: Once | INTRAVENOUS | Status: DC

## 2011-12-27 MED ORDER — IRINOTECAN HCL CHEMO INJECTION 100 MG/5ML
150.0000 mg/m2 | Freq: Once | INTRAVENOUS | Status: AC
  Administered 2011-12-27: 282 mg via INTRAVENOUS
  Filled 2011-12-27: qty 14.1

## 2011-12-27 MED ORDER — HEPARIN SOD (PORK) LOCK FLUSH 100 UNIT/ML IV SOLN
500.0000 [IU] | Freq: Once | INTRAVENOUS | Status: DC | PRN
  Filled 2011-12-27: qty 5

## 2011-12-27 MED ORDER — LORAZEPAM 2 MG/ML IJ SOLN
0.5000 mg | Freq: Once | INTRAMUSCULAR | Status: AC
  Administered 2011-12-27: 0.5 mg via INTRAVENOUS

## 2011-12-27 MED ORDER — SODIUM CHLORIDE 0.9 % IJ SOLN
10.0000 mL | INTRAMUSCULAR | Status: DC | PRN
  Filled 2011-12-27: qty 10

## 2011-12-27 NOTE — Progress Notes (Signed)
Ativan0.5 mg given iv  For c/o nausea. [id number]. Per Samuella Bruin

## 2011-12-29 ENCOUNTER — Encounter (HOSPITAL_BASED_OUTPATIENT_CLINIC_OR_DEPARTMENT_OTHER): Payer: Medicaid Other

## 2011-12-29 DIAGNOSIS — Z452 Encounter for adjustment and management of vascular access device: Secondary | ICD-10-CM

## 2011-12-29 DIAGNOSIS — C189 Malignant neoplasm of colon, unspecified: Secondary | ICD-10-CM

## 2011-12-29 MED ORDER — SODIUM CHLORIDE 0.9 % IJ SOLN
10.0000 mL | INTRAMUSCULAR | Status: DC | PRN
  Administered 2011-12-29: 10 mL via INTRAVENOUS
  Filled 2011-12-29: qty 10

## 2011-12-29 MED ORDER — SODIUM CHLORIDE 0.9 % IJ SOLN
INTRAMUSCULAR | Status: AC
  Filled 2011-12-29: qty 10

## 2011-12-29 MED ORDER — HEPARIN SOD (PORK) LOCK FLUSH 100 UNIT/ML IV SOLN
INTRAVENOUS | Status: AC
  Filled 2011-12-29: qty 5

## 2011-12-29 MED ORDER — HEPARIN SOD (PORK) LOCK FLUSH 100 UNIT/ML IV SOLN
500.0000 [IU] | Freq: Once | INTRAVENOUS | Status: AC
  Administered 2011-12-29: 500 [IU] via INTRAVENOUS
  Filled 2011-12-29: qty 5

## 2011-12-29 NOTE — Progress Notes (Signed)
Yolanda Friedman presented for Portacath access and flush.  Proper placement of portacath confirmed by CXR.  Portacath located right chest wall accessed with  H 20 needle.  Portacath flushed with 20ml NS and 500U/5ml Heparin and needle removed intact.  Procedure without incident.  Patient tolerated procedure well.  

## 2012-01-04 ENCOUNTER — Encounter (HOSPITAL_COMMUNITY): Payer: Self-pay | Admitting: *Deleted

## 2012-01-04 ENCOUNTER — Ambulatory Visit (HOSPITAL_COMMUNITY): Payer: Medicaid Other | Admitting: Oncology

## 2012-01-09 ENCOUNTER — Encounter (HOSPITAL_COMMUNITY): Payer: Medicaid Other | Attending: Oncology

## 2012-01-09 DIAGNOSIS — L089 Local infection of the skin and subcutaneous tissue, unspecified: Secondary | ICD-10-CM | POA: Insufficient documentation

## 2012-01-09 DIAGNOSIS — C189 Malignant neoplasm of colon, unspecified: Secondary | ICD-10-CM

## 2012-01-09 DIAGNOSIS — R110 Nausea: Secondary | ICD-10-CM | POA: Insufficient documentation

## 2012-01-09 LAB — DIFFERENTIAL
Eosinophils Relative: 1 % (ref 0–5)
Lymphocytes Relative: 43 % (ref 12–46)
Monocytes Absolute: 0.4 10*3/uL (ref 0.1–1.0)
Monocytes Relative: 9 % (ref 3–12)
Neutro Abs: 2 10*3/uL (ref 1.7–7.7)

## 2012-01-09 LAB — URINALYSIS, DIPSTICK ONLY
Bilirubin Urine: NEGATIVE
Glucose, UA: 250 mg/dL — AB
Hgb urine dipstick: NEGATIVE
Ketones, ur: NEGATIVE mg/dL
Protein, ur: NEGATIVE mg/dL

## 2012-01-09 LAB — CBC
HCT: 33.2 % — ABNORMAL LOW (ref 36.0–46.0)
Hemoglobin: 10.5 g/dL — ABNORMAL LOW (ref 12.0–15.0)
MCV: 95.1 fL (ref 78.0–100.0)
WBC: 4.3 10*3/uL (ref 4.0–10.5)

## 2012-01-09 NOTE — Progress Notes (Signed)
Labs drawn today for cbc,diff, urine

## 2012-01-10 ENCOUNTER — Encounter (HOSPITAL_BASED_OUTPATIENT_CLINIC_OR_DEPARTMENT_OTHER): Payer: Medicaid Other

## 2012-01-10 VITALS — BP 166/99 | HR 100 | Temp 98.4°F | Wt 180.8 lb

## 2012-01-10 DIAGNOSIS — C787 Secondary malignant neoplasm of liver and intrahepatic bile duct: Secondary | ICD-10-CM

## 2012-01-10 DIAGNOSIS — Z5111 Encounter for antineoplastic chemotherapy: Secondary | ICD-10-CM

## 2012-01-10 DIAGNOSIS — C7960 Secondary malignant neoplasm of unspecified ovary: Secondary | ICD-10-CM

## 2012-01-10 DIAGNOSIS — C189 Malignant neoplasm of colon, unspecified: Secondary | ICD-10-CM

## 2012-01-10 MED ORDER — LEUCOVORIN CALCIUM INJECTION 100 MG
20.0000 mg/m2 | Freq: Once | INTRAMUSCULAR | Status: AC
  Administered 2012-01-10: 38 mg via INTRAVENOUS
  Filled 2012-01-10: qty 1.9

## 2012-01-10 MED ORDER — FLUOROURACIL CHEMO INJECTION 2.5 GM/50ML
400.0000 mg/m2 | Freq: Once | INTRAVENOUS | Status: AC
  Administered 2012-01-10: 750 mg via INTRAVENOUS
  Filled 2012-01-10: qty 15

## 2012-01-10 MED ORDER — SODIUM CHLORIDE 0.9 % IV SOLN: 16.0000 mg | Freq: Once | INTRAVENOUS | Status: DC

## 2012-01-10 MED ORDER — SODIUM CHLORIDE 0.9 % IV SOLN
Freq: Once | INTRAVENOUS | Status: AC
  Administered 2012-01-10: 16 mg via INTRAVENOUS
  Filled 2012-01-10: qty 8

## 2012-01-10 MED ORDER — IRINOTECAN HCL CHEMO INJECTION 100 MG/5ML
150.0000 mg/m2 | Freq: Once | INTRAVENOUS | Status: AC
  Administered 2012-01-10: 282 mg via INTRAVENOUS
  Filled 2012-01-10: qty 14.1

## 2012-01-10 MED ORDER — SODIUM CHLORIDE 0.9 % IV SOLN
Freq: Once | INTRAVENOUS | Status: AC
  Administered 2012-01-10: 11:00:00 via INTRAVENOUS

## 2012-01-10 MED ORDER — HEPARIN SOD (PORK) LOCK FLUSH 100 UNIT/ML IV SOLN
500.0000 [IU] | Freq: Once | INTRAVENOUS | Status: DC | PRN
  Filled 2012-01-10: qty 5

## 2012-01-10 MED ORDER — LEUCOVORIN CALCIUM INJECTION 350 MG: 400.0000 mg/m2 | Freq: Once | INTRAVENOUS | Status: DC

## 2012-01-10 MED ORDER — LORAZEPAM 2 MG/ML IJ SOLN
INTRAMUSCULAR | Status: AC
  Filled 2012-01-10: qty 1

## 2012-01-10 MED ORDER — SODIUM CHLORIDE 0.9 % IJ SOLN
10.0000 mL | INTRAMUSCULAR | Status: DC | PRN
  Administered 2012-01-10: 10 mL
  Filled 2012-01-10: qty 10

## 2012-01-10 MED ORDER — SODIUM CHLORIDE 0.9 % IV SOLN
5.0000 mg/kg | Freq: Once | INTRAVENOUS | Status: AC
  Administered 2012-01-10: 425 mg via INTRAVENOUS
  Filled 2012-01-10: qty 17

## 2012-01-10 MED ORDER — DEXAMETHASONE SODIUM PHOSPHATE 4 MG/ML IJ SOLN: 20.0000 mg | Freq: Once | INTRAMUSCULAR | Status: DC

## 2012-01-10 MED ORDER — SODIUM CHLORIDE 0.9 % IV SOLN
2000.0000 mg/m2 | INTRAVENOUS | Status: DC
  Administered 2012-01-10: 3750 mg via INTRAVENOUS
  Filled 2012-01-10 (×2): qty 75

## 2012-01-10 MED ORDER — LORAZEPAM 2 MG/ML IJ SOLN
1.0000 mg | Freq: Once | INTRAMUSCULAR | Status: AC
  Administered 2012-01-10: 1 mg via INTRAVENOUS

## 2012-01-10 NOTE — Progress Notes (Signed)
Tolerated chemo well. 

## 2012-01-12 ENCOUNTER — Encounter (HOSPITAL_BASED_OUTPATIENT_CLINIC_OR_DEPARTMENT_OTHER): Payer: Medicaid Other

## 2012-01-12 VITALS — BP 87/67 | HR 111 | Temp 98.0°F

## 2012-01-12 DIAGNOSIS — C787 Secondary malignant neoplasm of liver and intrahepatic bile duct: Secondary | ICD-10-CM

## 2012-01-12 DIAGNOSIS — C189 Malignant neoplasm of colon, unspecified: Secondary | ICD-10-CM

## 2012-01-12 DIAGNOSIS — Z452 Encounter for adjustment and management of vascular access device: Secondary | ICD-10-CM

## 2012-01-12 MED ORDER — SODIUM CHLORIDE 0.9 % IJ SOLN
10.0000 mL | INTRAMUSCULAR | Status: DC | PRN
  Administered 2012-01-12: 10 mL

## 2012-01-12 MED ORDER — SODIUM CHLORIDE 0.9 % IJ SOLN
INTRAMUSCULAR | Status: AC
  Filled 2012-01-12: qty 10

## 2012-01-12 MED ORDER — HEPARIN SOD (PORK) LOCK FLUSH 100 UNIT/ML IV SOLN
500.0000 [IU] | Freq: Once | INTRAVENOUS | Status: AC | PRN
  Administered 2012-01-12: 500 [IU]

## 2012-01-12 MED ORDER — HEPARIN SOD (PORK) LOCK FLUSH 100 UNIT/ML IV SOLN
INTRAVENOUS | Status: AC
  Filled 2012-01-12: qty 5

## 2012-01-12 NOTE — Progress Notes (Signed)
Addended by: Edythe Lynn A on: 01/12/2012 02:23 PM   Modules accepted: Orders

## 2012-01-12 NOTE — Progress Notes (Signed)
Yolanda Friedman presented for Portacath deaccess and pump d/c and flush. Proper placement of portacath confirmed by CXR. Portacath located lt chest wall.  Portacath flushed with 20ml NS and 500U/72ml Heparin and needle removed intact. Procedure without incident. Patient tolerated procedure well. Tolerated chemo fair, states she had 2 episode n/v yesterday, understands importance to push fluids.

## 2012-01-15 ENCOUNTER — Telehealth (HOSPITAL_COMMUNITY): Payer: Self-pay | Admitting: *Deleted

## 2012-01-15 NOTE — Telephone Encounter (Signed)
Pt called to report increased pain in hands, mouth. States this treatment has really been hard on her. Wants to get hydrocodone filled now. Called [facility] and they have a refill available and will fill it. Abrish notified.

## 2012-01-23 ENCOUNTER — Encounter (HOSPITAL_BASED_OUTPATIENT_CLINIC_OR_DEPARTMENT_OTHER): Payer: Medicaid Other

## 2012-01-23 DIAGNOSIS — C189 Malignant neoplasm of colon, unspecified: Secondary | ICD-10-CM

## 2012-01-23 LAB — CBC
HCT: 33.4 % — ABNORMAL LOW (ref 36.0–46.0)
Hemoglobin: 10.7 g/dL — ABNORMAL LOW (ref 12.0–15.0)
MCH: 30.7 pg (ref 26.0–34.0)
MCHC: 32 g/dL (ref 30.0–36.0)
MCV: 95.7 fL (ref 78.0–100.0)

## 2012-01-23 LAB — URINALYSIS, DIPSTICK ONLY
Bilirubin Urine: NEGATIVE
Hgb urine dipstick: NEGATIVE
Ketones, ur: NEGATIVE mg/dL
Nitrite: NEGATIVE
Urobilinogen, UA: 0.2 mg/dL (ref 0.0–1.0)

## 2012-01-23 LAB — DIFFERENTIAL
Eosinophils Absolute: 0.1 10*3/uL (ref 0.0–0.7)
Eosinophils Relative: 2 % (ref 0–5)
Lymphs Abs: 1.5 10*3/uL (ref 0.7–4.0)
Monocytes Relative: 11 % (ref 3–12)

## 2012-01-23 NOTE — Progress Notes (Signed)
Labs drawn today for cbc/diff, cea, urine dipstick

## 2012-01-24 ENCOUNTER — Encounter (HOSPITAL_BASED_OUTPATIENT_CLINIC_OR_DEPARTMENT_OTHER): Payer: Medicaid Other

## 2012-01-24 VITALS — BP 151/92 | HR 101 | Temp 97.6°F | Wt 176.4 lb

## 2012-01-24 DIAGNOSIS — Z5111 Encounter for antineoplastic chemotherapy: Secondary | ICD-10-CM

## 2012-01-24 DIAGNOSIS — N6009 Solitary cyst of unspecified breast: Secondary | ICD-10-CM

## 2012-01-24 DIAGNOSIS — C787 Secondary malignant neoplasm of liver and intrahepatic bile duct: Secondary | ICD-10-CM

## 2012-01-24 DIAGNOSIS — R110 Nausea: Secondary | ICD-10-CM

## 2012-01-24 DIAGNOSIS — C7960 Secondary malignant neoplasm of unspecified ovary: Secondary | ICD-10-CM

## 2012-01-24 DIAGNOSIS — N6459 Other signs and symptoms in breast: Secondary | ICD-10-CM

## 2012-01-24 DIAGNOSIS — C189 Malignant neoplasm of colon, unspecified: Secondary | ICD-10-CM

## 2012-01-24 DIAGNOSIS — L089 Local infection of the skin and subcutaneous tissue, unspecified: Secondary | ICD-10-CM

## 2012-01-24 MED ORDER — LORAZEPAM 2 MG/ML IJ SOLN
INTRAMUSCULAR | Status: AC
  Filled 2012-01-24: qty 1

## 2012-01-24 MED ORDER — ONDANSETRON HCL 40 MG/20ML IJ SOLN: 16.0000 mg | Freq: Once | INTRAMUSCULAR | Status: DC

## 2012-01-24 MED ORDER — IRINOTECAN HCL CHEMO INJECTION 100 MG/5ML
150.0000 mg/m2 | Freq: Once | INTRAVENOUS | Status: AC
  Administered 2012-01-24: 282 mg via INTRAVENOUS
  Filled 2012-01-24: qty 14.1

## 2012-01-24 MED ORDER — SODIUM CHLORIDE 0.9 % IJ SOLN
INTRAMUSCULAR | Status: AC
  Filled 2012-01-24: qty 10

## 2012-01-24 MED ORDER — DEXTROSE 5 % IV SOLN: 400.0000 mg/m2 | Freq: Once | INTRAVENOUS | Status: DC

## 2012-01-24 MED ORDER — LEUCOVORIN CALCIUM INJECTION 100 MG
20.0000 mg/m2 | Freq: Once | INTRAMUSCULAR | Status: AC
  Administered 2012-01-24: 38 mg via INTRAVENOUS
  Filled 2012-01-24: qty 1.9

## 2012-01-24 MED ORDER — SODIUM CHLORIDE 0.9 % IV SOLN
2000.0000 mg/m2 | INTRAVENOUS | Status: DC
  Administered 2012-01-24: 3750 mg via INTRAVENOUS
  Filled 2012-01-24 (×2): qty 75

## 2012-01-24 MED ORDER — LORAZEPAM 2 MG/ML IJ SOLN
0.5000 mg | Freq: Once | INTRAMUSCULAR | Status: AC
  Administered 2012-01-24: 0.5 mg via INTRAVENOUS

## 2012-01-24 MED ORDER — BEVACIZUMAB CHEMO INJECTION 400 MG/16ML
5.0000 mg/kg | Freq: Once | INTRAVENOUS | Status: AC
  Administered 2012-01-24: 425 mg via INTRAVENOUS
  Filled 2012-01-24: qty 17

## 2012-01-24 MED ORDER — CEPHALEXIN 500 MG PO CAPS: 500.0000 mg | ORAL_CAPSULE | Freq: Four times a day (QID) | ORAL | Status: AC

## 2012-01-24 MED ORDER — SODIUM CHLORIDE 0.9 % IV SOLN
Freq: Once | INTRAVENOUS | Status: AC
  Administered 2012-01-24: 16 mg via INTRAVENOUS
  Filled 2012-01-24: qty 8

## 2012-01-24 MED ORDER — FLUOROURACIL CHEMO INJECTION 2.5 GM/50ML
400.0000 mg/m2 | Freq: Once | INTRAVENOUS | Status: AC
  Administered 2012-01-24: 750 mg via INTRAVENOUS
  Filled 2012-01-24: qty 15

## 2012-01-24 MED ORDER — DEXAMETHASONE SODIUM PHOSPHATE 4 MG/ML IJ SOLN: 20.0000 mg | Freq: Once | INTRAMUSCULAR | Status: DC

## 2012-01-24 MED ORDER — SODIUM CHLORIDE 0.9 % IV SOLN
Freq: Once | INTRAVENOUS | Status: AC
  Administered 2012-01-24: 11:00:00 via INTRAVENOUS

## 2012-01-24 NOTE — Progress Notes (Signed)
Tolerated well today, had anticipatory nausea prior to cpt11, treated with IV ativan and no further complaints

## 2012-01-24 NOTE — Progress Notes (Signed)
Yolanda Friedman reports an area of her areola that becomes painful every week.  She is able to discharge creamy discharge from this with palpation.    There is a cyst in the inner, upper quadrant of the areola that is approximately 1 cm in size.  It is tender.  She is able to expel yellow/white discharge.    A culture was done of this and evaluate for infection.  This looks like an infected cyst.  Will also start Keflex antibiotic as well.   Cylis Ayars

## 2012-01-26 ENCOUNTER — Encounter (HOSPITAL_BASED_OUTPATIENT_CLINIC_OR_DEPARTMENT_OTHER): Payer: Medicaid Other

## 2012-01-26 DIAGNOSIS — C189 Malignant neoplasm of colon, unspecified: Secondary | ICD-10-CM

## 2012-01-26 MED ORDER — HEPARIN SOD (PORK) LOCK FLUSH 100 UNIT/ML IV SOLN
500.0000 [IU] | Freq: Once | INTRAVENOUS | Status: AC
  Administered 2012-01-26: 500 [IU] via INTRAVENOUS
  Filled 2012-01-26: qty 5

## 2012-01-26 MED ORDER — HEPARIN SOD (PORK) LOCK FLUSH 100 UNIT/ML IV SOLN
INTRAVENOUS | Status: AC
  Filled 2012-01-26: qty 5

## 2012-01-26 MED ORDER — SODIUM CHLORIDE 0.9 % IJ SOLN
INTRAMUSCULAR | Status: AC
  Filled 2012-01-26: qty 10

## 2012-01-26 MED ORDER — SODIUM CHLORIDE 0.9 % IJ SOLN
10.0000 mL | INTRAMUSCULAR | Status: DC | PRN
  Administered 2012-01-26: 10 mL via INTRAVENOUS
  Filled 2012-01-26: qty 10

## 2012-01-26 NOTE — Progress Notes (Signed)
Continuous infusion pump D/C.  Port flushed.  Tolerated well.

## 2012-01-27 LAB — WOUND CULTURE
Culture: NO GROWTH
Gram Stain: NONE SEEN

## 2012-01-31 ENCOUNTER — Ambulatory Visit (HOSPITAL_COMMUNITY): Payer: Medicaid Other | Admitting: Oncology

## 2012-02-06 ENCOUNTER — Other Ambulatory Visit (HOSPITAL_COMMUNITY): Payer: Self-pay

## 2012-02-06 ENCOUNTER — Other Ambulatory Visit (HOSPITAL_COMMUNITY): Payer: Self-pay | Admitting: Oncology

## 2012-02-06 ENCOUNTER — Encounter (HOSPITAL_COMMUNITY): Payer: Medicaid Other | Attending: Oncology

## 2012-02-06 DIAGNOSIS — C189 Malignant neoplasm of colon, unspecified: Secondary | ICD-10-CM

## 2012-02-06 DIAGNOSIS — T451X5A Adverse effect of antineoplastic and immunosuppressive drugs, initial encounter: Secondary | ICD-10-CM | POA: Insufficient documentation

## 2012-02-06 DIAGNOSIS — E119 Type 2 diabetes mellitus without complications: Secondary | ICD-10-CM

## 2012-02-06 DIAGNOSIS — R112 Nausea with vomiting, unspecified: Secondary | ICD-10-CM | POA: Insufficient documentation

## 2012-02-06 LAB — COMPREHENSIVE METABOLIC PANEL
Albumin: 3.3 g/dL — ABNORMAL LOW (ref 3.5–5.2)
Alkaline Phosphatase: 64 U/L (ref 39–117)
BUN: 11 mg/dL (ref 6–23)
CO2: 25 mEq/L (ref 19–32)
Chloride: 103 mEq/L (ref 96–112)
Creatinine, Ser: 0.84 mg/dL (ref 0.50–1.10)
GFR calc Af Amer: 85 mL/min — ABNORMAL LOW (ref >90)
GFR calc non Af Amer: 74 mL/min — ABNORMAL LOW (ref >90)
Glucose, Bld: 152 mg/dL — ABNORMAL HIGH (ref 70–99)
Potassium: 3.9 mEq/L (ref 3.5–5.1)
Total Bilirubin: 0.3 mg/dL (ref 0.3–1.2)

## 2012-02-06 LAB — URINALYSIS, DIPSTICK ONLY
Bilirubin Urine: NEGATIVE
Glucose, UA: 100 mg/dL — AB
Protein, ur: NEGATIVE mg/dL
Specific Gravity, Urine: 1.01 (ref 1.005–1.030)
Urobilinogen, UA: 0.2 mg/dL (ref 0.0–1.0)

## 2012-02-06 LAB — CBC
HCT: 32.3 % — ABNORMAL LOW (ref 36.0–46.0)
Hemoglobin: 10.4 g/dL — ABNORMAL LOW (ref 12.0–15.0)
MCV: 95.3 fL (ref 78.0–100.0)
RBC: 3.39 MIL/uL — ABNORMAL LOW (ref 3.87–5.11)
WBC: 3.1 10*3/uL — ABNORMAL LOW (ref 4.0–10.5)

## 2012-02-06 LAB — DIFFERENTIAL
Eosinophils Relative: 2 % (ref 0–5)
Monocytes Absolute: 0.3 10*3/uL (ref 0.1–1.0)
Monocytes Relative: 10 % (ref 3–12)
Neutro Abs: 1.3 10*3/uL — ABNORMAL LOW (ref 1.7–7.7)
Neutrophils Relative %: 42 % — ABNORMAL LOW (ref 43–77)

## 2012-02-06 MED ORDER — METFORMIN HCL 500 MG PO TABS: 500.0000 mg | ORAL_TABLET | Freq: Two times a day (BID) | ORAL | Status: DC

## 2012-02-06 MED ORDER — HYDROCODONE-ACETAMINOPHEN 10-325 MG PO TABS: 1.0000 | ORAL_TABLET | Freq: Four times a day (QID) | ORAL | Status: DC | PRN

## 2012-02-06 NOTE — Progress Notes (Signed)
Labs drawn today for cbc/diff,cmp,urine dipstick 

## 2012-02-07 ENCOUNTER — Encounter (HOSPITAL_BASED_OUTPATIENT_CLINIC_OR_DEPARTMENT_OTHER): Payer: Medicaid Other

## 2012-02-07 VITALS — BP 141/99 | HR 81 | Temp 97.0°F | Ht 60.0 in | Wt 181.8 lb

## 2012-02-07 DIAGNOSIS — T451X5A Adverse effect of antineoplastic and immunosuppressive drugs, initial encounter: Secondary | ICD-10-CM

## 2012-02-07 DIAGNOSIS — C189 Malignant neoplasm of colon, unspecified: Secondary | ICD-10-CM

## 2012-02-07 DIAGNOSIS — R112 Nausea with vomiting, unspecified: Secondary | ICD-10-CM

## 2012-02-07 DIAGNOSIS — C7960 Secondary malignant neoplasm of unspecified ovary: Secondary | ICD-10-CM

## 2012-02-07 DIAGNOSIS — C787 Secondary malignant neoplasm of liver and intrahepatic bile duct: Secondary | ICD-10-CM

## 2012-02-07 DIAGNOSIS — Z5111 Encounter for antineoplastic chemotherapy: Secondary | ICD-10-CM

## 2012-02-07 MED ORDER — SODIUM CHLORIDE 0.9 % IJ SOLN
10.0000 mL | INTRAMUSCULAR | Status: DC | PRN
  Administered 2012-02-07: 10 mL
  Filled 2012-02-07: qty 10

## 2012-02-07 MED ORDER — SODIUM CHLORIDE 0.9 % IV SOLN
2000.0000 mg/m2 | INTRAVENOUS | Status: DC
  Administered 2012-02-07: 3750 mg via INTRAVENOUS
  Filled 2012-02-07 (×2): qty 75

## 2012-02-07 MED ORDER — IRINOTECAN HCL CHEMO INJECTION 100 MG/5ML
150.0000 mg/m2 | Freq: Once | INTRAVENOUS | Status: AC
  Administered 2012-02-07: 282 mg via INTRAVENOUS
  Filled 2012-02-07: qty 14.1

## 2012-02-07 MED ORDER — LORAZEPAM 2 MG/ML IJ SOLN
0.5000 mg | Freq: Once | INTRAMUSCULAR | Status: AC
  Administered 2012-02-07: 0.5 mg via INTRAVENOUS

## 2012-02-07 MED ORDER — SODIUM CHLORIDE 0.9 % IV SOLN
Freq: Once | INTRAVENOUS | Status: AC
  Administered 2012-02-07: 09:00:00 via INTRAVENOUS

## 2012-02-07 MED ORDER — SODIUM CHLORIDE 0.9 % IV SOLN
5.0000 mg/kg | Freq: Once | INTRAVENOUS | Status: AC
  Administered 2012-02-07: 425 mg via INTRAVENOUS
  Filled 2012-02-07: qty 17

## 2012-02-07 MED ORDER — HEPARIN SOD (PORK) LOCK FLUSH 100 UNIT/ML IV SOLN
500.0000 [IU] | Freq: Once | INTRAVENOUS | Status: AC | PRN
  Administered 2012-02-07: 500 [IU]
  Filled 2012-02-07: qty 5

## 2012-02-07 MED ORDER — SODIUM CHLORIDE 0.9 % IJ SOLN
INTRAMUSCULAR | Status: AC
  Filled 2012-02-07: qty 10

## 2012-02-07 MED ORDER — DEXAMETHASONE SODIUM PHOSPHATE 4 MG/ML IJ SOLN: 20.0000 mg | Freq: Once | INTRAMUSCULAR | Status: DC

## 2012-02-07 MED ORDER — LORAZEPAM 2 MG/ML IJ SOLN
INTRAMUSCULAR | Status: AC
  Filled 2012-02-07: qty 1

## 2012-02-07 MED ORDER — SODIUM CHLORIDE 0.9 % IV SOLN: 16.0000 mg | Freq: Once | INTRAVENOUS | Status: DC

## 2012-02-07 MED ORDER — ONDANSETRON HCL 4 MG/2ML IJ SOLN
Freq: Once | INTRAMUSCULAR | Status: AC
  Administered 2012-02-07: 16 mg via INTRAVENOUS
  Filled 2012-02-07: qty 8

## 2012-02-07 MED ORDER — LEUCOVORIN CALCIUM INJECTION 350 MG: 400.0000 mg/m2 | Freq: Once | INTRAVENOUS | Status: DC

## 2012-02-07 MED ORDER — FLUOROURACIL CHEMO INJECTION 2.5 GM/50ML
400.0000 mg/m2 | Freq: Once | INTRAVENOUS | Status: AC
  Administered 2012-02-07: 750 mg via INTRAVENOUS
  Filled 2012-02-07: qty 15

## 2012-02-07 MED ORDER — LEUCOVORIN CALCIUM INJECTION 100 MG
20.0000 mg/m2 | Freq: Once | INTRAMUSCULAR | Status: AC
  Administered 2012-02-07: 38 mg via INTRAVENOUS
  Filled 2012-02-07: qty 1.9

## 2012-02-07 NOTE — Progress Notes (Signed)
Pt was nauseated prior to cpt 11. Ativan 0.5 mg given. 1338 c/o nausea again, ativan 0.5 repeated. 1400, nausea relieved and home with daughter-in-law

## 2012-02-09 ENCOUNTER — Other Ambulatory Visit (HOSPITAL_COMMUNITY): Payer: Self-pay | Admitting: Oncology

## 2012-02-09 ENCOUNTER — Encounter (HOSPITAL_BASED_OUTPATIENT_CLINIC_OR_DEPARTMENT_OTHER): Payer: Medicaid Other

## 2012-02-09 DIAGNOSIS — C189 Malignant neoplasm of colon, unspecified: Secondary | ICD-10-CM

## 2012-02-09 DIAGNOSIS — Z452 Encounter for adjustment and management of vascular access device: Secondary | ICD-10-CM

## 2012-02-09 MED ORDER — HEPARIN SOD (PORK) LOCK FLUSH 100 UNIT/ML IV SOLN
INTRAVENOUS | Status: AC
  Filled 2012-02-09: qty 5

## 2012-02-09 MED ORDER — SODIUM CHLORIDE 0.9 % IJ SOLN
INTRAMUSCULAR | Status: AC
  Filled 2012-02-09: qty 10

## 2012-02-09 MED ORDER — HEPARIN SOD (PORK) LOCK FLUSH 100 UNIT/ML IV SOLN
500.0000 [IU] | Freq: Once | INTRAVENOUS | Status: AC | PRN
  Administered 2012-02-09: 500 [IU]
  Filled 2012-02-09: qty 5

## 2012-02-09 MED ORDER — SODIUM CHLORIDE 0.9 % IJ SOLN
10.0000 mL | INTRAMUSCULAR | Status: DC | PRN
  Administered 2012-02-09: 10 mL
  Filled 2012-02-09: qty 10

## 2012-02-09 NOTE — Progress Notes (Signed)
Yolanda Friedman presented for Portacath access and flush.  Proper placement of portacath confirmed by CXR.  Portacath located right chest wall accessed with  H 20 needle.  Portacath flushed with 20ml NS and 500U/49ml Heparin and needle removed intact.  Procedure without incident.  Patient tolerated procedure well.

## 2012-02-20 ENCOUNTER — Encounter (HOSPITAL_BASED_OUTPATIENT_CLINIC_OR_DEPARTMENT_OTHER): Payer: Medicaid Other

## 2012-02-20 DIAGNOSIS — C189 Malignant neoplasm of colon, unspecified: Secondary | ICD-10-CM

## 2012-02-20 LAB — DIFFERENTIAL
Basophils Relative: 0 % (ref 0–1)
Eosinophils Absolute: 0.1 10*3/uL (ref 0.0–0.7)
Eosinophils Relative: 2 % (ref 0–5)
Lymphs Abs: 1.5 10*3/uL (ref 0.7–4.0)
Monocytes Relative: 9 % (ref 3–12)
Neutrophils Relative %: 51 % (ref 43–77)

## 2012-02-20 LAB — CBC
HCT: 36.3 % (ref 36.0–46.0)
Hemoglobin: 11.5 g/dL — ABNORMAL LOW (ref 12.0–15.0)
RBC: 3.77 MIL/uL — ABNORMAL LOW (ref 3.87–5.11)
WBC: 4 10*3/uL (ref 4.0–10.5)

## 2012-02-20 LAB — COMPREHENSIVE METABOLIC PANEL
ALT: 25 U/L (ref 0–35)
Albumin: 3.5 g/dL (ref 3.5–5.2)
Alkaline Phosphatase: 68 U/L (ref 39–117)
BUN: 11 mg/dL (ref 6–23)
Chloride: 100 mEq/L (ref 96–112)
GFR calc Af Amer: 79 mL/min — ABNORMAL LOW (ref >90)
Glucose, Bld: 177 mg/dL — ABNORMAL HIGH (ref 70–99)
Potassium: 4.1 mEq/L (ref 3.5–5.1)
Sodium: 137 mEq/L (ref 135–145)
Total Bilirubin: 0.3 mg/dL (ref 0.3–1.2)

## 2012-02-20 LAB — URINALYSIS, DIPSTICK ONLY
Bilirubin Urine: NEGATIVE
Ketones, ur: NEGATIVE mg/dL
Leukocytes, UA: NEGATIVE
Nitrite: NEGATIVE
Urobilinogen, UA: 0.2 mg/dL (ref 0.0–1.0)
pH: 6 (ref 5.0–8.0)

## 2012-02-20 NOTE — Progress Notes (Signed)
Labs drawn today for cbc/diff,cmp,udip

## 2012-02-21 ENCOUNTER — Encounter (HOSPITAL_BASED_OUTPATIENT_CLINIC_OR_DEPARTMENT_OTHER): Payer: Medicaid Other

## 2012-02-21 VITALS — BP 145/86 | HR 105 | Temp 97.8°F | Ht 60.0 in | Wt 177.7 lb

## 2012-02-21 DIAGNOSIS — C189 Malignant neoplasm of colon, unspecified: Secondary | ICD-10-CM

## 2012-02-21 DIAGNOSIS — R109 Unspecified abdominal pain: Secondary | ICD-10-CM

## 2012-02-21 DIAGNOSIS — Z5111 Encounter for antineoplastic chemotherapy: Secondary | ICD-10-CM

## 2012-02-21 DIAGNOSIS — T451X5A Adverse effect of antineoplastic and immunosuppressive drugs, initial encounter: Secondary | ICD-10-CM

## 2012-02-21 DIAGNOSIS — R110 Nausea: Secondary | ICD-10-CM

## 2012-02-21 DIAGNOSIS — C787 Secondary malignant neoplasm of liver and intrahepatic bile duct: Secondary | ICD-10-CM

## 2012-02-21 DIAGNOSIS — C7960 Secondary malignant neoplasm of unspecified ovary: Secondary | ICD-10-CM

## 2012-02-21 MED ORDER — LEUCOVORIN CALCIUM INJECTION 100 MG
20.0000 mg/m2 | Freq: Once | INTRAMUSCULAR | Status: AC
  Administered 2012-02-21: 38 mg via INTRAVENOUS
  Filled 2012-02-21: qty 1.9

## 2012-02-21 MED ORDER — SODIUM CHLORIDE 0.9 % IV SOLN
5.0000 mg/kg | Freq: Once | INTRAVENOUS | Status: AC
  Administered 2012-02-21: 425 mg via INTRAVENOUS
  Filled 2012-02-21: qty 17

## 2012-02-21 MED ORDER — LEUCOVORIN CALCIUM INJECTION 350 MG: 400.0000 mg/m2 | Freq: Once | INTRAVENOUS | Status: DC

## 2012-02-21 MED ORDER — SODIUM CHLORIDE 0.9 % IV SOLN: 16.0000 mg | Freq: Once | INTRAVENOUS | Status: DC

## 2012-02-21 MED ORDER — SODIUM CHLORIDE 0.9 % IV SOLN
Freq: Once | INTRAVENOUS | Status: AC
  Administered 2012-02-21: 10:00:00 via INTRAVENOUS

## 2012-02-21 MED ORDER — SODIUM CHLORIDE 0.9 % IV SOLN
2000.0000 mg/m2 | INTRAVENOUS | Status: DC
  Administered 2012-02-21: 3750 mg via INTRAVENOUS
  Filled 2012-02-21 (×2): qty 75

## 2012-02-21 MED ORDER — DEXAMETHASONE SODIUM PHOSPHATE 4 MG/ML IJ SOLN: 20.0000 mg | Freq: Once | INTRAMUSCULAR | Status: DC

## 2012-02-21 MED ORDER — HYDROMORPHONE HCL PF 1 MG/ML IJ SOLN
0.5000 mg | Freq: Once | INTRAMUSCULAR | Status: AC
  Administered 2012-02-21: 0.5 mg via INTRAVENOUS

## 2012-02-21 MED ORDER — SODIUM CHLORIDE 0.9 % IJ SOLN
10.0000 mL | INTRAMUSCULAR | Status: DC | PRN
  Filled 2012-02-21: qty 10

## 2012-02-21 MED ORDER — HYDROMORPHONE HCL PF 1 MG/ML IJ SOLN
INTRAMUSCULAR | Status: AC
  Filled 2012-02-21: qty 1

## 2012-02-21 MED ORDER — IRINOTECAN HCL CHEMO INJECTION 100 MG/5ML
150.0000 mg/m2 | Freq: Once | INTRAVENOUS | Status: AC
  Administered 2012-02-21: 282 mg via INTRAVENOUS
  Filled 2012-02-21: qty 14.1

## 2012-02-21 MED ORDER — LORAZEPAM 2 MG/ML IJ SOLN
1.0000 mg | Freq: Once | INTRAMUSCULAR | Status: AC
  Administered 2012-02-21: 1 mg via INTRAVENOUS

## 2012-02-21 MED ORDER — FLUOROURACIL CHEMO INJECTION 2.5 GM/50ML
400.0000 mg/m2 | Freq: Once | INTRAVENOUS | Status: AC
  Administered 2012-02-21: 750 mg via INTRAVENOUS
  Filled 2012-02-21: qty 15

## 2012-02-21 MED ORDER — SODIUM CHLORIDE 0.9 % IV SOLN
Freq: Once | INTRAVENOUS | Status: AC
  Administered 2012-02-21: 16 mg via INTRAVENOUS
  Filled 2012-02-21: qty 8

## 2012-02-21 MED ORDER — LORAZEPAM 2 MG/ML IJ SOLN
INTRAMUSCULAR | Status: AC
  Filled 2012-02-21: qty 1

## 2012-02-21 MED ORDER — HEPARIN SOD (PORK) LOCK FLUSH 100 UNIT/ML IV SOLN
500.0000 [IU] | Freq: Once | INTRAVENOUS | Status: DC | PRN
  Filled 2012-02-21: qty 5

## 2012-02-21 NOTE — Progress Notes (Signed)
Yolanda Friedman reported an episode of nausea and abdominal cramping during chemo infusion, ativan given for nausea and dilaudid given for pain - patient reported relief in symptoms after meds were given.  Otherwise, Yolanda Friedman tolerated chemos well and without incident.  She understands home infusion pump use and need for follow up.  Questions answered at this time.

## 2012-02-23 ENCOUNTER — Encounter (HOSPITAL_BASED_OUTPATIENT_CLINIC_OR_DEPARTMENT_OTHER): Payer: Medicaid Other

## 2012-02-23 DIAGNOSIS — C787 Secondary malignant neoplasm of liver and intrahepatic bile duct: Secondary | ICD-10-CM

## 2012-02-23 DIAGNOSIS — C189 Malignant neoplasm of colon, unspecified: Secondary | ICD-10-CM

## 2012-02-23 MED ORDER — SODIUM CHLORIDE 0.9 % IJ SOLN
INTRAMUSCULAR | Status: AC
  Filled 2012-02-23: qty 10

## 2012-02-23 MED ORDER — SODIUM CHLORIDE 0.9 % IJ SOLN
10.0000 mL | INTRAMUSCULAR | Status: DC | PRN
  Administered 2012-02-23: 10 mL via INTRAVENOUS
  Filled 2012-02-23: qty 10

## 2012-02-23 MED ORDER — HEPARIN SOD (PORK) LOCK FLUSH 100 UNIT/ML IV SOLN
500.0000 [IU] | Freq: Once | INTRAVENOUS | Status: AC
  Administered 2012-02-23: 500 [IU] via INTRAVENOUS
  Filled 2012-02-23: qty 5

## 2012-02-23 MED ORDER — HEPARIN SOD (PORK) LOCK FLUSH 100 UNIT/ML IV SOLN
INTRAVENOUS | Status: AC
  Filled 2012-02-23: qty 5

## 2012-02-23 NOTE — Progress Notes (Signed)
Continuous infusion pump D/C. Port flushed per protocol. Tolerated well.

## 2012-02-28 ENCOUNTER — Ambulatory Visit (HOSPITAL_COMMUNITY): Payer: Medicaid Other

## 2012-02-29 ENCOUNTER — Ambulatory Visit (HOSPITAL_COMMUNITY): Payer: Medicaid Other

## 2012-03-01 ENCOUNTER — Ambulatory Visit (HOSPITAL_COMMUNITY): Payer: Medicaid Other | Admitting: Oncology

## 2012-03-06 ENCOUNTER — Ambulatory Visit (HOSPITAL_COMMUNITY)
Admission: RE | Admit: 2012-03-06 | Discharge: 2012-03-06 | Disposition: A | Discharge status: Home / Self Care | Payer: Medicaid Other | Source: Ambulatory Visit | Attending: Oncology | Admitting: Oncology

## 2012-03-06 ENCOUNTER — Other Ambulatory Visit (HOSPITAL_COMMUNITY): Payer: Medicaid Other

## 2012-03-06 DIAGNOSIS — I10 Essential (primary) hypertension: Secondary | ICD-10-CM | POA: Insufficient documentation

## 2012-03-06 DIAGNOSIS — E119 Type 2 diabetes mellitus without complications: Secondary | ICD-10-CM | POA: Insufficient documentation

## 2012-03-06 DIAGNOSIS — C189 Malignant neoplasm of colon, unspecified: Secondary | ICD-10-CM | POA: Insufficient documentation

## 2012-03-06 DIAGNOSIS — R911 Solitary pulmonary nodule: Secondary | ICD-10-CM | POA: Insufficient documentation

## 2012-03-06 MED ORDER — IOHEXOL 300 MG/ML  SOLN
100.0000 mL | Freq: Once | INTRAMUSCULAR | Status: AC | PRN
  Administered 2012-03-06: 100 mL via INTRAVENOUS

## 2012-03-08 ENCOUNTER — Ambulatory Visit (HOSPITAL_COMMUNITY): Payer: Medicaid Other | Admitting: Oncology

## 2012-03-08 ENCOUNTER — Encounter (HOSPITAL_COMMUNITY): Payer: Medicaid Other

## 2012-03-08 ENCOUNTER — Encounter (HOSPITAL_COMMUNITY): Payer: Self-pay

## 2012-03-08 ENCOUNTER — Encounter (HOSPITAL_COMMUNITY): Payer: Medicaid Other | Attending: Oncology | Admitting: Oncology

## 2012-03-08 DIAGNOSIS — C189 Malignant neoplasm of colon, unspecified: Secondary | ICD-10-CM

## 2012-03-08 DIAGNOSIS — C7960 Secondary malignant neoplasm of unspecified ovary: Secondary | ICD-10-CM

## 2012-03-08 MED ORDER — HEPARIN SOD (PORK) LOCK FLUSH 100 UNIT/ML IV SOLN
INTRAVENOUS | Status: AC
  Filled 2012-03-08: qty 5

## 2012-03-08 MED ORDER — SODIUM CHLORIDE 0.9 % IJ SOLN
10.0000 mL | INTRAMUSCULAR | Status: DC | PRN
  Administered 2012-03-08: 10 mL via INTRAVENOUS
  Filled 2012-03-08: qty 10

## 2012-03-08 MED ORDER — SODIUM CHLORIDE 0.9 % IJ SOLN
INTRAMUSCULAR | Status: AC
  Filled 2012-03-08: qty 10

## 2012-03-08 MED ORDER — HEPARIN SOD (PORK) LOCK FLUSH 100 UNIT/ML IV SOLN
500.0000 [IU] | Freq: Once | INTRAVENOUS | Status: AC
  Administered 2012-03-08: 500 [IU] via INTRAVENOUS
  Filled 2012-03-08: qty 5

## 2012-03-08 NOTE — Patient Instructions (Addendum)
Yolanda Friedman  [id number] [birth year] Dr. Glenford Peers   [facility]  Discharge Instructions  RECOMMENDATIONS MADE BY THE CONSULTANT AND ANY TEST RESULTS WILL BE SENT TO YOUR REFERRING DOCTOR.   EXAM FINDINGS BY MD TODAY AND SIGNS AND SYMPTOMS TO REPORT TO CLINIC OR PRIMARY MD: exam and discussion per MD.  Continuing chemotherapy may give you more time, but is an individual choice depending upon your quality of life.  MEDICATIONS PRESCRIBED: none   INSTRUCTIONS GIVEN AND DISCUSSED: Other :  Report changes in bowel habits, blood in bowel movement, uncontrolled pain, etc.  SPECIAL INSTRUCTIONS/FOLLOW-UP: Lab work Needed in October and Return to Clinic in 6 weeks for port flush and in October to see Dr. Mariel Sleet.   I acknowledge that I have been informed and understand all the instructions given to me and received a copy. I do not have any more questions at this time, but understand that I may call the Specialty Clinic at [facility] at [phone] during business hours should I have any further questions or need assistance in obtaining follow-up care.    __________________________________________  _____________  __________ Signature of Patient or Authorized Representative            Date                   Time    __________________________________________ Nurse's Signature

## 2012-03-08 NOTE — Progress Notes (Signed)
Yolanda Friedman presented for Portacath access and flush. Proper placement of portacath confirmed by CXR. Portacath located right chest wall accessed with  H 20 needle. Good blood return present. Portacath flushed with 20ml NS and 500U/5ml Heparin and needle removed intact. Procedure without incident. Patient tolerated procedure well.   

## 2012-03-08 NOTE — Progress Notes (Signed)
Problem #1 metastatic recurrent adenocarcinoma of colon diagnosed to both ovaries when she was operated on by Dr. 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  She has finished most recently CPT-11 based chemotherapy. She is tired and fatigues easily and has stomach discomfort. The latter she thinks is related to nerves when she is upset with someone is when she gets abdominal discomfort. She still moves her bowels about 3 or 4 times a day. Her vital signs however still so quite good weight. She doesn't look in any distress today. Her vital signs are recorded. Her lungs are clear. Heart shows a regular rhythm and rate without murmur rub or gallop. Her Port-A-Cath in the right upper chest walls intact. She has no adenopathy in any location abdomen is soft without organomegaly without tenderness and without masses and her bowel sounds are very quiet right now. She has no arm or leg edema. She is in no acute distress. She is alert and oriented. She is accompanied by her son and her daughter-in-law.  After a long discussion she would like to take a break from therapy. So we will give her 12 weeks off. She states that she is not sure she wants to resume chemotherapy but she is absolutely sure she does not want to resume oxaliplatinum-based chemotherapy. She might take the same therapy that we just finished namely CPT-11 bevacizumab and 5-FU therapy. Right now however she wants a break. She plans to go to [place] to visit family and if she stays longer then 8 weeks she will have her port flushed there. We will see her in October for labs and then I'll see her in see what she wants to do. We do not have a CAT scan planned since she is not sure she wants to go on. I will see her then. We spent 25-30 minutes in counseling essentially about what her cancer means and what it might do to affect her future. One of the things we had to straighten out is the fact that she still has colon cancer not ovarian  cancer even though we have gone over this before. Her son and daughter-in-law I think understand is perfectly however

## 2012-03-12 ENCOUNTER — Encounter (HOSPITAL_COMMUNITY): Payer: Medicaid Other

## 2012-03-28 ENCOUNTER — Emergency Department: Payer: Self-pay

## 2012-03-28 ENCOUNTER — Emergency Department
Admission: EM | Admit: 2012-03-28 | Discharge: 2012-03-28 | Disposition: A | Discharge status: Home / Self Care | Payer: Self-pay | Attending: Emergency Medicine | Admitting: Emergency Medicine

## 2012-03-28 DIAGNOSIS — S93609A Unspecified sprain of unspecified foot, initial encounter: Secondary | ICD-10-CM | POA: Insufficient documentation

## 2012-03-28 DIAGNOSIS — I10 Essential (primary) hypertension: Secondary | ICD-10-CM | POA: Insufficient documentation

## 2012-03-28 DIAGNOSIS — Y9229 Other specified public building as the place of occurrence of the external cause: Secondary | ICD-10-CM | POA: Insufficient documentation

## 2012-03-28 DIAGNOSIS — E785 Hyperlipidemia, unspecified: Secondary | ICD-10-CM | POA: Insufficient documentation

## 2012-03-28 DIAGNOSIS — E119 Type 2 diabetes mellitus without complications: Secondary | ICD-10-CM | POA: Insufficient documentation

## 2012-03-28 DIAGNOSIS — W010XXA Fall on same level from slipping, tripping and stumbling without subsequent striking against object, initial encounter: Secondary | ICD-10-CM | POA: Insufficient documentation

## 2012-03-28 HISTORY — DX: Hyperlipidemia, unspecified: E78.5

## 2012-03-28 HISTORY — DX: Malignant (primary) neoplasm, unspecified: C80.1

## 2012-03-28 HISTORY — DX: Essential (primary) hypertension: I10

## 2012-03-28 HISTORY — DX: Type 2 diabetes mellitus without complications: E11.9

## 2012-03-28 MED ORDER — HYDROCODONE-ACETAMINOPHEN 5-325 MG PO TABS
1.00 | ORAL_TABLET | Freq: Four times a day (QID) | ORAL | Status: AC | PRN
Start: 2012-03-28 — End: 2012-04-07

## 2012-03-28 NOTE — ED Provider Notes (Signed)
Physician/Midlevel provider first contact with patient: 03/28/12 1424         History     Chief Complaint   Patient presents with   . Back Pain   . Knee Pain     Patient is a 61 y.o. female presenting with back pain and knee pain. The history is provided by the patient. No language interpreter was used.   Back Pain   This is a new problem. The current episode started yesterday. The problem occurs constantly. The problem has been gradually improving. The pain is associated with falling. The quality of the pain is described as aching. The pain does not radiate. The pain is mild. The symptoms are aggravated by certain positions. The pain is the same all the time. Pertinent negatives include no headaches, no abdominal pain, no leg pain and no tingling. She has tried analgesics for the symptoms. The treatment provided mild relief. Risk factors include a history of cancer.   Knee Pain  Pertinent negatives include no abdominal pain, headaches, myalgias or rash.     Yesterday patient slipped on some water in [place] with her L knee, jarred her R knee and lower back, but twisted her R foot, which is what is causing most of her pain, took hydrocodone last night.   Past Medical History   Diagnosis Date   . Cancer    . Hypertensive disorder    . Diabetes mellitus without complication    . Hyperlipidemia        Past Surgical History   Procedure Date   . Abdominal surgery    . Cholecystectomy    . Hysterectomy        History reviewed. No pertinent family history.    Social  History   Substance Use Topics   . Smoking status: Never Smoker    . Smokeless tobacco: Not on file   . Alcohol Use: No       .     No Known Allergies    Current/Home Medications    AMLODIPINE-VALSARTAN (EXFORGE) 5-160 MG PER TABLET    Take 1 tablet by mouth daily.    ASPIRIN 81 MG TABLET    Take 81 mg by mouth daily.    ESCITALOPRAM (LEXAPRO) 10 MG TABLET    Take 10 mg by mouth daily.    HYDROCODONE-ACETAMINOPHEN (VICODIN) 7.5-500 MG PER TABLET    Take 1  tablet by mouth every 6 (six) hours as needed.    IBUPROFEN (ADVIL,MOTRIN) 200 MG TABLET    Take 200 mg by mouth every 6 (six) hours as needed.    LORAZEPAM (ATIVAN) 1 MG TABLET    Take 1 mg by mouth every 6 (six) hours as needed.    METFORMIN (GLUCOPHAGE) 500 MG TABLET    Take 500 mg by mouth 2 (two) times daily with meals.    OMEPRAZOLE (PRILOSEC) 20 MG CAPSULE    Take 20 mg by mouth daily.    PROCHLORPERAZINE (COMPAZINE) 25 MG SUPPOSITORY    Place 25 mg rectally every 12 (twelve) hours as needed.    TEMAZEPAM (RESTORIL) 15 MG CAPSULE    Take 15 mg by mouth nightly as needed.        Review of Systems   Gastrointestinal: Negative for abdominal pain.   Musculoskeletal: Positive for back pain. Negative for myalgias.   Skin: Negative for rash and wound.   Neurological: Negative for dizziness, tingling and headaches.       Physical Exam  BP 133/80  Pulse 77  Temp(Src) 98 F (36.7 C) (Oral)  Resp 18  Ht 1.524 m  Wt 79.833 kg  BMI 34.37 kg/m2  SpO2 97%    Physical Exam   Nursing note and vitals reviewed.  Constitutional: She is oriented to person, place, and time. She appears well-developed and well-nourished.   HENT:   Head: Normocephalic and atraumatic.   Eyes: Conjunctivae normal are normal. Right eye exhibits no discharge.   Neck: Normal range of motion. Neck supple.   Pulmonary/Chest: Effort normal. No respiratory distress.   Musculoskeletal: Normal range of motion. Tenderness: mild tenderness to knee and R lower back, no midline tenderness, most tender to R anteriorr foot with some swelling, no ecchymosis or deformtiy, pulse intact, no tenderness to ankle.    Neurological: She is alert and oriented to person, place, and time.   Skin: Skin is warm and dry.   Psychiatric: She has a normal mood and affect. Her behavior is normal.       MDM and ED Course     ED Medication Orders     None           MDM  Number of Diagnoses or Management Options  Diagnosis management comments: I, Lenor Derrick PA-C, have  been the primary provider for [place] during this Emergency Dept visit.   Oxygen saturation by pulse oximetry is 95%-100%, Normal.  Interventions: None Needed.  I have reviewed nursing history.     The attending signature signifies review and agreement of the history, physical examination, evaluation, clinical impression and plan except as noted.     DD: foot fracture vs sprain    No midline tenderness pain to lower back, does not believe she broke her back or her knee, minimal pain and is ambulatory, most pain that needs diagnostic imaging is her R foot. Patient agrees with plan.     Patient is not tender to base of 5th MT, tender to anterior top aspect of foot, thus will treat like sprain. Patient is from [facility], f/u if needed.     Procedures    Clinical Impression & Disposition     Clinical Impression  Final diagnoses:   Foot sprain        ED Disposition     Discharge Tavie Haseman discharge to home/self care.    Condition at discharge: Stable             New Prescriptions    HYDROCODONE-ACETAMINOPHEN (NORCO) 5-325 MG PER TABLET    Take 1 tablet by mouth every 6 (six) hours as needed for Pain.               Lenor Derrick [place], [place]  03/28/12 [phone]

## 2012-03-28 NOTE — ED Provider Notes (Signed)
Review of MLP charts:  I, Angelique Blonder MD,  have reviewed the history, physical exam, evaluation, plan, clinical impression, and agree.        Kalman Drape, MD  03/28/12 [phone]

## 2012-03-28 NOTE — ED Notes (Signed)
Pt. States in [place] yesterday when she slipped on something wet on floor. Pt. States her left leg came out in front of her and her right leg bent down. C/o right lower back pain and right knee pain that has "crackle" sound.

## 2012-03-28 NOTE — Discharge Instructions (Signed)
Foot Sprain    You have been diagnosed with a sprain of your foot.   A sprain of your foot is an injury to the ligaments in your foot. It IS NOT the same as a sprain of the ankle.    A sprain is an injury to a ligament, usually a tear or partial tear. Sprains can often be as painful as a fracture. Sprains can be divided into 3 categories by the amount of injury to the ligaments. A first-degree sprain is considered a minor tear whereas a third degree sprain often involves a chip fracture of the bone that to which the ligament is attached.    The general care of a sprain includes the use of a medication to reduce pain, the use of a splint to reduce movement and Resting, Icing, Compressing and Elevating the injured area. Remember this as "RICE."   REST: Limit the use of the injured body part.   ICE: By applying ice to the affected area, swelling and pain can be reduced. Place some ice cubes in a re-sealable (Ziploc) bag and add some water. Put a thin washcloth between the bag and the skin. Apply the ice bag to the area for at least 20 minutes. Do this at least 4 times per day. Using the ice for longer times and more frequently is OK. NEVER APPLY ICE DIRECTLY TO THE SKIN.   COMPRESS: Compression means to apply pressure around the injured area such as with a splint, cast or an ace bandage. Compression decreases swelling and improves comfort. Compression should be tight enough to relieve swelling but not so tight as to decrease circulation. Increasing pain, numbness, tingling, or change in skin color, are all signs of decreased circulation.   ELEVATE: Elevate the injured part. For example, a leg can be elevated by placing the leg on a chair while sitting and propped up on pillows while lying down.    You have been given an ACE BANDAGE to wear. Wear your ACE bandage to compress the injured area. This increases comfort and reduces swelling. The ACE bandage should fit snugly but not so tight as to decrease  circulation. Watch for swelling of the area beyond the ace wrap. Check capillary refill (circulation) in the nail beds by pressing on the nail bed. When you press on the nail bed it should turn white and then promptly return to pink in less than 2 seconds after you let go. Loosen the wrap if it is too tight.    Wear the ACE bandage   As needed for comfort.    You have been given an orthopedic "cast shoe," also called a "hard shoe." This shoe is used to make walking more comfortable by not letting your foot bend too much while walking. You can use the shoe for comfort.    YOU SHOULD SEEK MEDICAL ATTENTION IMMEDIATELY, EITHER HERE OR AT THE NEAREST EMERGENCY DEPARTMENT, IF ANY OF THE FOLLOWING OCCURS:   You experience a severe increase in pain in the affected area.   You develop new numbness and tingling in or below the affected area.   You develop a cold, pale foot that appears to have a problem with its blood supply.  R.I.C.E.    Some things you can do to help your injury are: Resting, Icing, Compressing and Elevating the injured area. Remember this as "RICE."   REST: Limit the use of the injured body part.   ICE: By applying ice to the affected area, swelling   and pain can be reduced. Place some ice cubes in a re-sealable (Ziploc) bag and add some water. Put a thin washcloth between the bag and the skin. Apply the ice bag to the area for at least 20 minutes. Do this at least 4 times per day. It is okay to do this more often than directed. You can also do it for longer than directed. NEVER APPLY ICE DIRECTLY TO THE SKIN.   COMPRESS: Compression means to apply pressure around the injured area such as with a splint, cast or an ace bandage. Compression decreases swelling and improves comfort. Compression should be tight enough to relieve swelling but not so tight as to decrease circulation. Increasing pain, numbness, tingling, or change in skin color, are all signs of decreased circulation.   ELEVATE:  Elevate the injured part. For example, elevate your foot by placing it on a chair while sitting, or propping it up on pillows when lying down.

## 2012-04-12 ENCOUNTER — Other Ambulatory Visit (HOSPITAL_COMMUNITY): Payer: Self-pay | Admitting: Oncology

## 2012-04-12 ENCOUNTER — Telehealth (HOSPITAL_COMMUNITY): Payer: Self-pay | Admitting: *Deleted

## 2012-04-12 DIAGNOSIS — C189 Malignant neoplasm of colon, unspecified: Secondary | ICD-10-CM

## 2012-04-12 DIAGNOSIS — I10 Essential (primary) hypertension: Secondary | ICD-10-CM

## 2012-04-12 MED ORDER — HYDROCODONE-ACETAMINOPHEN 10-325 MG PO TABS: 1.0000 | ORAL_TABLET | Freq: Four times a day (QID) | ORAL | Status: DC | PRN

## 2012-04-12 MED ORDER — AMLODIPINE BESYLATE-VALSARTAN 5-160 MG PO TABS: 1.0000 | ORAL_TABLET | Freq: Every day | ORAL | Status: DC

## 2012-04-12 MED ORDER — POTASSIUM CHLORIDE ER 10 MEQ PO TBCR: 20.0000 meq | EXTENDED_RELEASE_TABLET | Freq: Every day | ORAL | Status: DC

## 2012-04-12 MED ORDER — LORAZEPAM 1 MG PO TABS: 1.0000 mg | ORAL_TABLET | ORAL | Status: DC | PRN

## 2012-04-12 MED ORDER — ONDANSETRON HCL 8 MG PO TABS: 8.0000 mg | ORAL_TABLET | Freq: Three times a day (TID) | ORAL | Status: DC | PRN

## 2012-04-12 NOTE — Telephone Encounter (Signed)
PT needs meds called into [facility] in [place], [place] where she is staying at present. # [phone]. She needs hydrocodone, potassium, exforge, and zofran. [facility] [place] [place] [facility]

## 2012-04-12 NOTE — Telephone Encounter (Signed)
[facility] said it would be easiest if we would just call [facility] in [place] for prescriptions.

## 2012-04-12 NOTE — Telephone Encounter (Signed)
Pt needed all meds called to [facility], ativan and hydrocodone cancelled at [facility] and called to [facility]. Son is aware and he is there to pick up

## 2012-04-12 NOTE — Telephone Encounter (Signed)
Will call meds into [facility].

## 2012-04-15 ENCOUNTER — Other Ambulatory Visit (HOSPITAL_COMMUNITY): Payer: Self-pay | Admitting: Oncology

## 2012-04-19 ENCOUNTER — Encounter (HOSPITAL_COMMUNITY): Payer: Medicaid Other

## 2012-05-07 ENCOUNTER — Encounter (HOSPITAL_COMMUNITY): Payer: Medicaid Other

## 2012-05-07 ENCOUNTER — Encounter (HOSPITAL_COMMUNITY): Payer: Medicaid Other | Attending: Oncology | Admitting: Oncology

## 2012-05-07 VITALS — BP 155/87 | HR 80 | Temp 97.9°F | Resp 18 | Wt 183.6 lb

## 2012-05-07 DIAGNOSIS — Z95828 Presence of other vascular implants and grafts: Secondary | ICD-10-CM

## 2012-05-07 DIAGNOSIS — Z9889 Other specified postprocedural states: Secondary | ICD-10-CM | POA: Insufficient documentation

## 2012-05-07 DIAGNOSIS — C787 Secondary malignant neoplasm of liver and intrahepatic bile duct: Secondary | ICD-10-CM

## 2012-05-07 DIAGNOSIS — C189 Malignant neoplasm of colon, unspecified: Secondary | ICD-10-CM | POA: Insufficient documentation

## 2012-05-07 DIAGNOSIS — C7960 Secondary malignant neoplasm of unspecified ovary: Secondary | ICD-10-CM

## 2012-05-07 DIAGNOSIS — Z23 Encounter for immunization: Secondary | ICD-10-CM | POA: Insufficient documentation

## 2012-05-07 LAB — BASIC METABOLIC PANEL
BUN: 14 mg/dL (ref 6–23)
CO2: 27 mEq/L (ref 19–32)
Calcium: 9.5 mg/dL (ref 8.4–10.5)
GFR calc non Af Amer: >90 mL/min (ref >90)
Glucose, Bld: 104 mg/dL — ABNORMAL HIGH (ref 70–99)
Potassium: 3.8 mEq/L (ref 3.5–5.1)
Sodium: 138 mEq/L (ref 135–145)

## 2012-05-07 MED ORDER — SODIUM CHLORIDE 0.9 % IJ SOLN
INTRAMUSCULAR | Status: AC
  Filled 2012-05-07: qty 10

## 2012-05-07 MED ORDER — HEPARIN SOD (PORK) LOCK FLUSH 100 UNIT/ML IV SOLN
500.0000 [IU] | Freq: Once | INTRAVENOUS | Status: AC
  Administered 2012-05-07: 500 [IU] via INTRAVENOUS
  Filled 2012-05-07: qty 5

## 2012-05-07 MED ORDER — HEPARIN SOD (PORK) LOCK FLUSH 100 UNIT/ML IV SOLN
INTRAVENOUS | Status: AC
  Filled 2012-05-07: qty 5

## 2012-05-07 MED ORDER — GABAPENTIN 300 MG PO CAPS: 300.0000 mg | ORAL_CAPSULE | Freq: Three times a day (TID) | ORAL | Status: DC

## 2012-05-07 MED ORDER — SODIUM CHLORIDE 0.9 % IJ SOLN
10.0000 mL | INTRAMUSCULAR | Status: DC | PRN
  Administered 2012-05-07: 10 mL via INTRAVENOUS
  Filled 2012-05-07: qty 10

## 2012-05-07 NOTE — Patient Instructions (Addendum)
Yolanda Friedman  DOB 1951-01-29 CSN [id number]  MRN [id number] Dr. Glenford Peers   [facility]  Discharge Instructions  RECOMMENDATIONS MADE BY THE CONSULTANT AND ANY TEST RESULTS WILL BE SENT TO YOUR REFERRING DOCTOR.   EXAM FINDINGS BY MD TODAY AND SIGNS AND SYMPTOMS TO REPORT TO CLINIC OR PRIMARY MD: You are doing well.  MEDICATIONS PRESCRIBED: Gabapentin take 300mg  twice daily for 1 week then take 1 three times daily.  Let us know how you are doing in a couple of weeks. Follow label directions and May cause drowsiness, do not operate machinery while taking this medication  INSTRUCTIONS GIVEN AND DISCUSSED: Other :  Report changes in bowel habits, blood in stool, etc.  SPECIAL INSTRUCTIONS/FOLLOW-UP: Lab work Needed today and in October, Xray Studies Needed CT of chest, abdomen and pelvis in October and Return to Clinic after scans in October.   I acknowledge that I have been informed and understand all the instructions given to me and received a copy. I do not have any more questions at this time, but understand that I may call the Specialty Clinic at [facility] at [phone] during business hours should I have any further questions or need assistance in obtaining follow-up care.    __________________________________________  _____________  __________ Signature of Patient or Authorized Representative            Date                   Time    __________________________________________ Nurse's Signature

## 2012-05-07 NOTE — Progress Notes (Signed)
Yolanda Friedman presented for Portacath access and flush. Proper placement of portacath confirmed by CXR. Portacath located rt chest wall accessed with  H 20 needle. Good blood return present. Portacath flushed with 20ml NS and 500U/5ml Heparin and needle removed intact. Procedure without incident. Patient tolerated procedure well.   

## 2012-05-07 NOTE — Progress Notes (Signed)
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 We will try gabapentin 300 mg twice a day for one week and then increase it to 3 times a day and she will let us know how she's doing. Problem #3 diabetes on metformin Problem #4 obesity now up to 183 pounds on a 5 foot 0 inch frame.  She has had a break in her chemotherapy with her most recent chemotherapy been CPT-11 based chemotherapy. She is feeling so much better. Her fatigue is much improved appetite is too good. She has been traveling to the [place] area to see friends and relatives and she had a great time. She on exam has stable vital signs. Weight is up a few pounds over. Lymph nodes are negative throughout. Port-A-Cath is intact in the right upper chest wall. Her lungs are clear. Her heart shows a regular rhythm and rate without murmur or gallop. Abdomen is soft and nontender without organomegaly. Bowel sounds are very normal. She has no peripheral edema but still again complains of numbness and tingling in the ankles and feet. She has very little symptomatology in her fingers and hands. So her neuropathy is better but not perfectly gone. She would like to try gabapentin which we will prescribe.  She is due for some CAT scan in October we will see her right after her blood work and CAT scans. If we need to we will reinitiate therapy with CPT-11, bevacizumab, and 5-FU.

## 2012-05-08 ENCOUNTER — Encounter (HOSPITAL_COMMUNITY): Payer: Medicaid Other

## 2012-05-20 ENCOUNTER — Other Ambulatory Visit (HOSPITAL_COMMUNITY): Payer: Self-pay | Admitting: Oncology

## 2012-05-20 DIAGNOSIS — C189 Malignant neoplasm of colon, unspecified: Secondary | ICD-10-CM

## 2012-05-20 DIAGNOSIS — K219 Gastro-esophageal reflux disease without esophagitis: Secondary | ICD-10-CM

## 2012-05-20 MED ORDER — OMEPRAZOLE 20 MG PO CPDR: 20.0000 mg | DELAYED_RELEASE_CAPSULE | Freq: Two times a day (BID) | ORAL | Status: DC

## 2012-05-21 MED ORDER — HYDROCODONE-ACETAMINOPHEN 10-325 MG PO TABS: 1.0000 | ORAL_TABLET | Freq: Four times a day (QID) | ORAL | Status: DC | PRN

## 2012-05-21 NOTE — Addendum Note (Signed)
Addended by: Ellouise Newer III on: 05/21/2012 01:10 PM   Modules accepted: Orders

## 2012-05-27 ENCOUNTER — Other Ambulatory Visit (HOSPITAL_COMMUNITY): Payer: Self-pay | Admitting: *Deleted

## 2012-05-27 DIAGNOSIS — C189 Malignant neoplasm of colon, unspecified: Secondary | ICD-10-CM

## 2012-05-27 MED ORDER — INFLUENZA VIRUS VACC SPLIT PF IM SUSP: 0.5000 mL | INTRAMUSCULAR | Status: AC

## 2012-05-28 ENCOUNTER — Encounter (HOSPITAL_BASED_OUTPATIENT_CLINIC_OR_DEPARTMENT_OTHER): Payer: Medicaid Other

## 2012-05-28 VITALS — BP 163/97 | HR 67 | Temp 97.9°F | Resp 18

## 2012-05-28 DIAGNOSIS — Z23 Encounter for immunization: Secondary | ICD-10-CM

## 2012-05-28 DIAGNOSIS — C189 Malignant neoplasm of colon, unspecified: Secondary | ICD-10-CM

## 2012-05-28 MED ORDER — INFLUENZA VIRUS VACC SPLIT PF IM SUSP
0.5000 mL | Freq: Once | INTRAMUSCULAR | Status: AC
  Administered 2012-05-28: 0.5 mL via INTRAMUSCULAR

## 2012-05-28 MED ORDER — INFLUENZA VIRUS VACC SPLIT PF IM SUSP
INTRAMUSCULAR | Status: AC
  Filled 2012-05-28: qty 0.5

## 2012-05-28 NOTE — Progress Notes (Signed)
Yolanda Friedman presents today for injection per MD orders. Flu vaccine administered SQ in right Upper Arm. Administration without incident. Patient tolerated well.

## 2012-05-31 IMAGING — CT CT ABD-PELV W/ CM
2 of 5 series · 17 of 46 positions shown, 19 images · IV contrast (Omnipaque 300)
Comparison: None.

CLINICAL DATA: Nominal pain.  Colon cancer.

CT ABDOMEN AND PELVIS WITH CONTRAST
TECHNIQUE: Multidetector CT imaging of the abdomen and pelvis was
performed following the standard protocol during bolus
administration of intravenous contrast.
Contrast: 100 ml [id number]

[Series 2: abd_pel_with 5.0 b40f · axial · 0.90mm/px · z∈[-716,-332]mm · 14 of 89 slices shown, 16 images]
[im 6/89  soft-tissue]
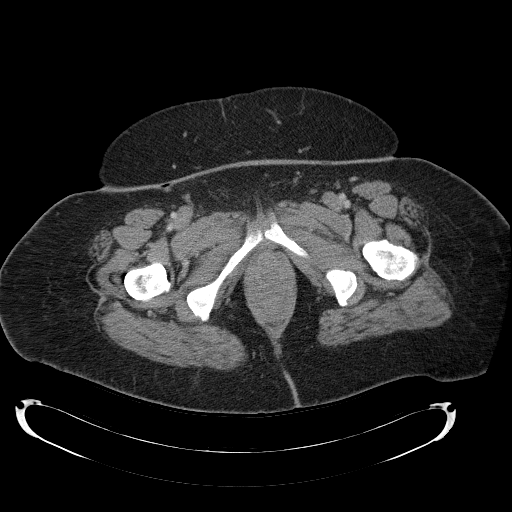
[im 6/89  bone]
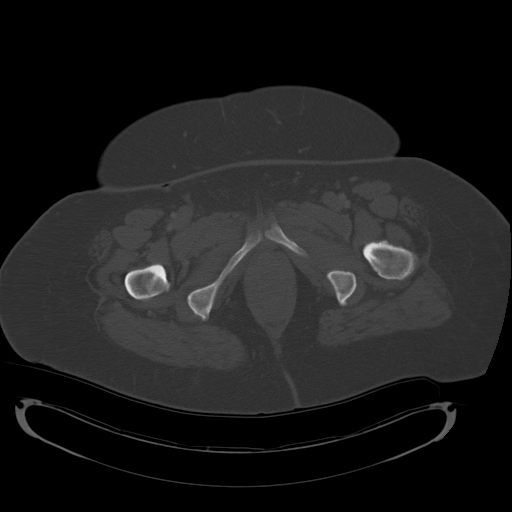
[im 11/89  soft-tissue]
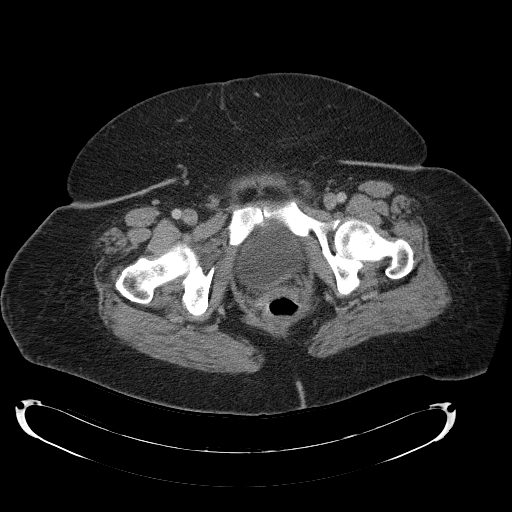
[im 16/89  soft-tissue]
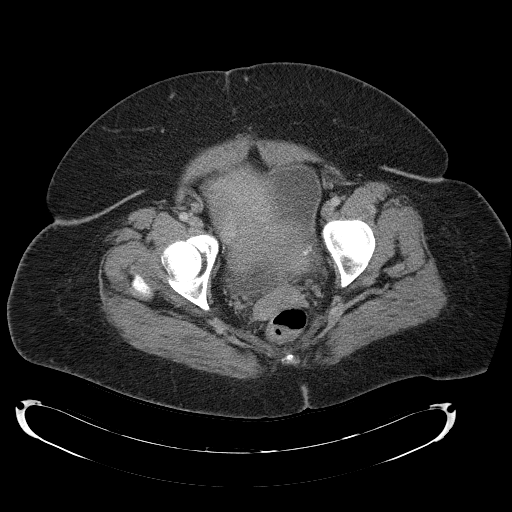
[im 26/89  soft-tissue]
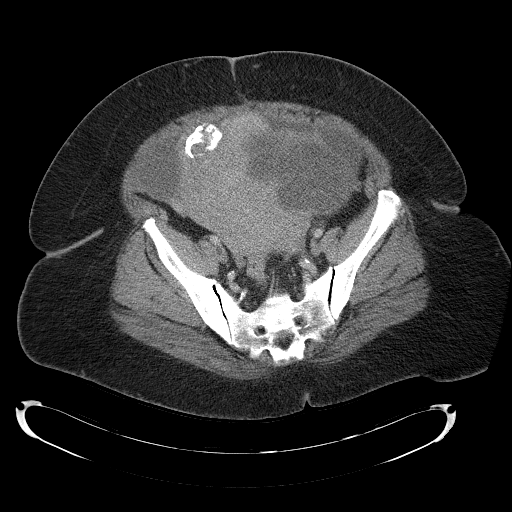
[im 32/89  soft-tissue]
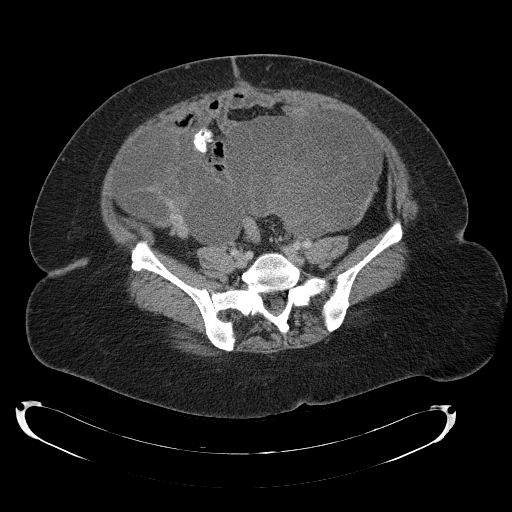
[im 37/89  soft-tissue]
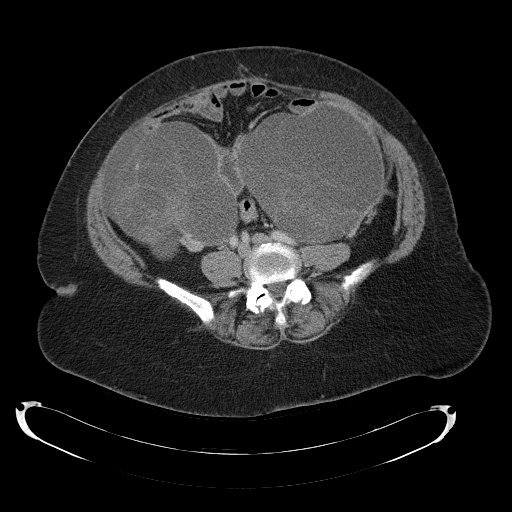
[im 42/89  soft-tissue]
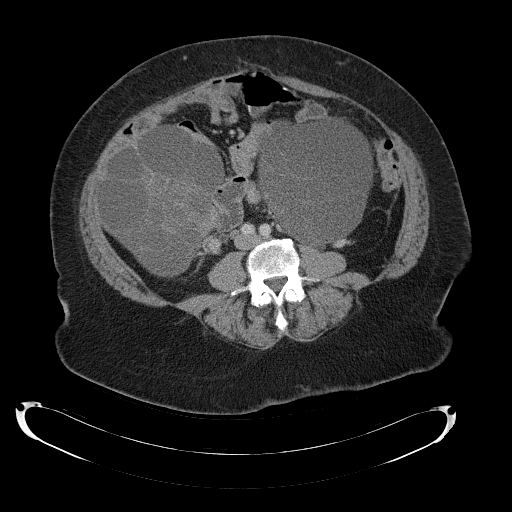
[im 47/89  soft-tissue]
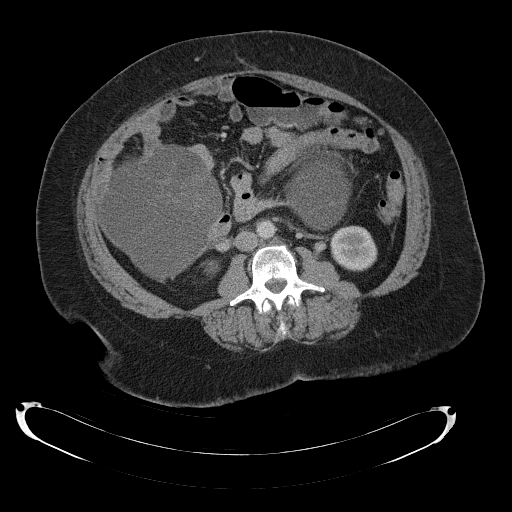
[im 52/89  soft-tissue]
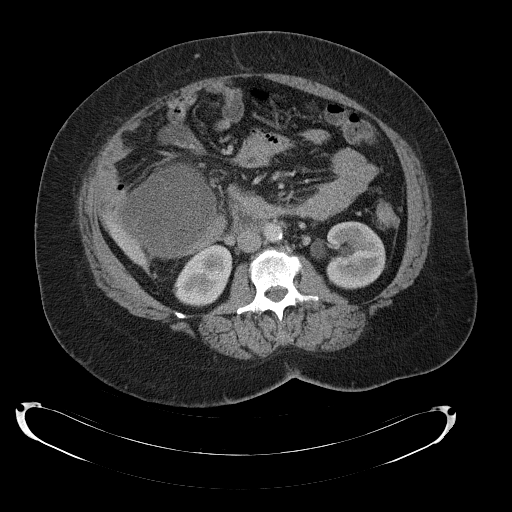
[im 52/89  bone]
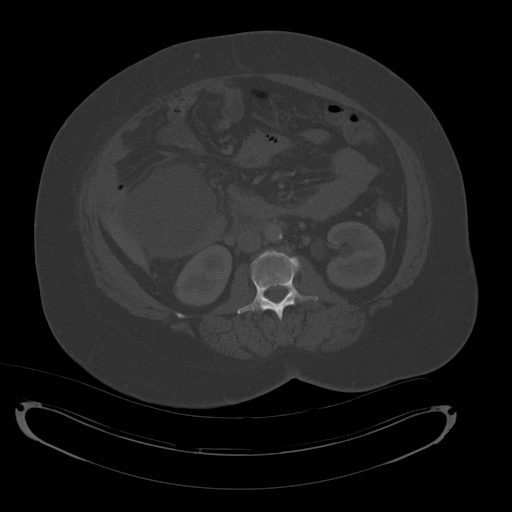
[im 57/89  soft-tissue]
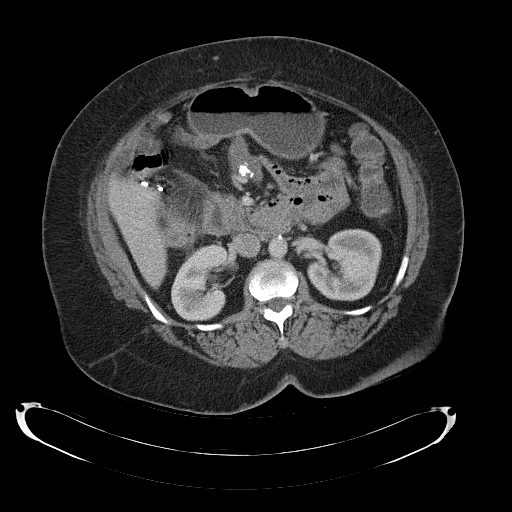
[im 68/89  soft-tissue]
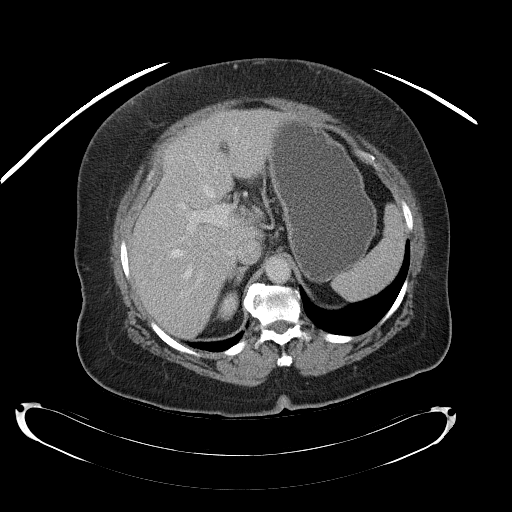
[im 73/89  soft-tissue]
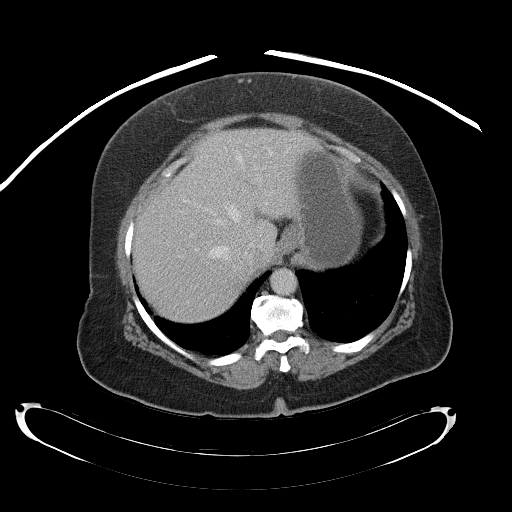
[im 78/89  soft-tissue]
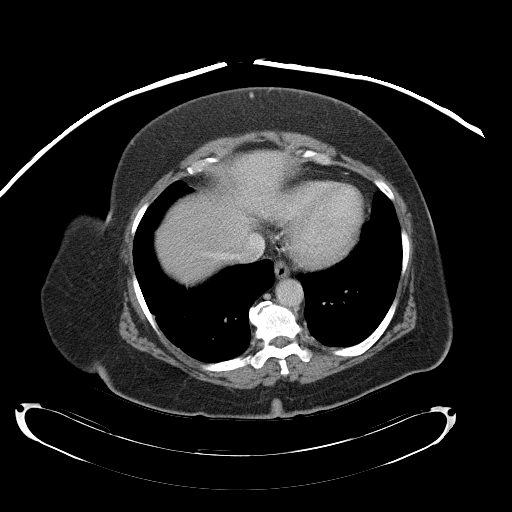
[im 83/89  soft-tissue]
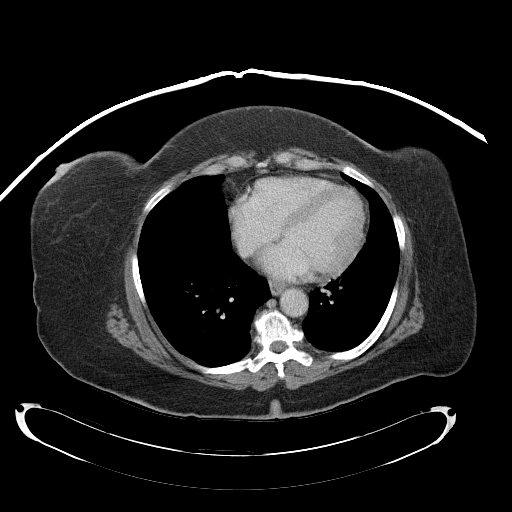

[Series 4: abd_pel_with 3.0 spo · coronal · 0.75mm/px · 3 of 97 slices shown]
[im 33/97  soft-tissue]
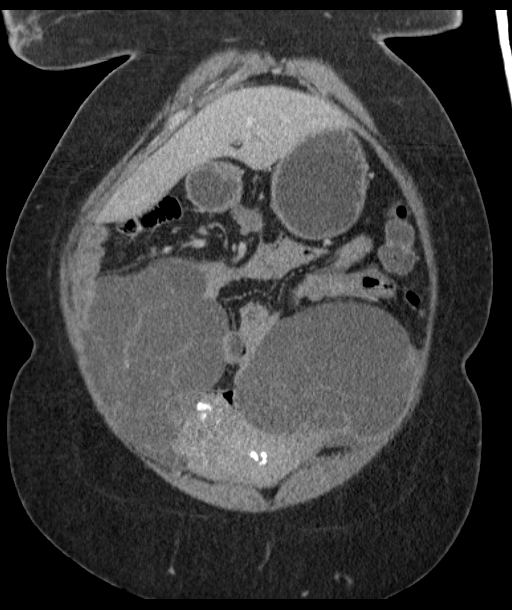
[im 43/97  soft-tissue]
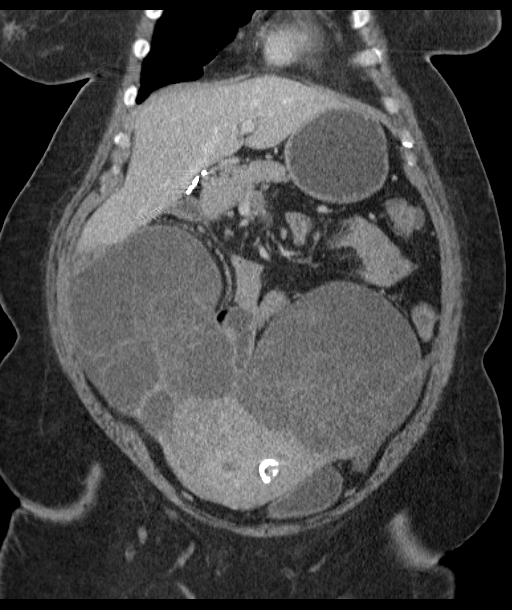
[im 54/97  soft-tissue]
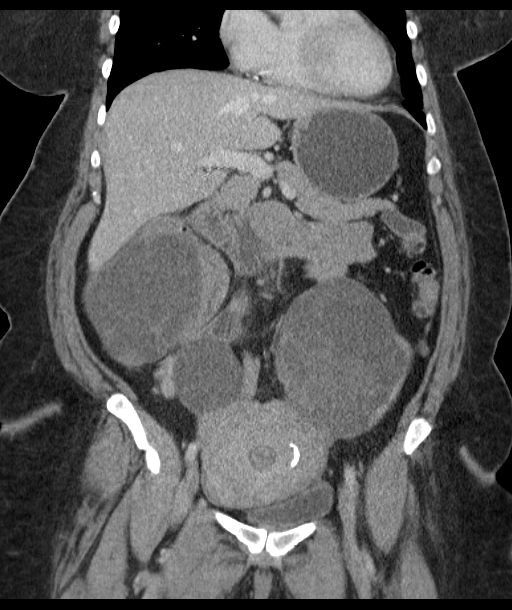

[17 of 46 positions shown; findings below may reference images not displayed]

FINDINGS: Two large complex cystic masses are seen arising from
both adnexa into the abdomen.  The right mass is 12.7 x 11.6 x
cm.  The left mass is 12.8 x 12.4 x 13.0 cm.  There are thick
enhancing septations.  This is worrisome for neoplasm.

Post cholecystectomy.  No liver lesion.  Kidneys, adrenal glands,
pancreas, spleen are within normal limits.

No evidence of bowel obstruction.

The uterus is markedly enlarged with several areas of calcification
likely a degenerated fibroid.  Endometrial stripe is thickened,
measuring up to 21 mm.

Small para-aortic nodes.
IMPRESSION: There are to large complex cystic masses arising from both adnexa.
This process can be seen with ovarian cancer.  The patient has a
history of colon cancer and this may represent loculated areas of
peritoneal carcinomatosis.

Abnormal appearance of the uterus with a thickened endometrial
stripe.  Pelvic ultrasound is recommended when feasible.

## 2012-05-31 IMAGING — US US PELVIS COMPLETE
1 series · 13 of 25 positions shown · non-contrast
Comparison: CT scan dated 11/17/2010

CLINICAL DATA: Large bilateral complex ovarian masses

TRANSABDOMINAL AND TRANSVAGINAL ULTRASOUND OF PELVIS
DOPPLER ULTRASOUND OF OVARIES
TECHNIQUE: Both transabdominal and transvaginal ultrasound
examinations of the pelvis were performed. Transabdominal technique
was performed for global imaging of the pelvis including uterus,
ovaries, adnexal regions, and pelvic cul-de-sac.
It was necessary to proceed with endovaginal exam following the
transabdominal exam to visualize the deep pelvic structures.
Color and duplex Doppler ultrasound was utilized to evaluate blood
flow to the ovaries.

[Series 1: us pelvis complete · 0.26mm/px · 13 of 103 slices shown]
[im 1/103]
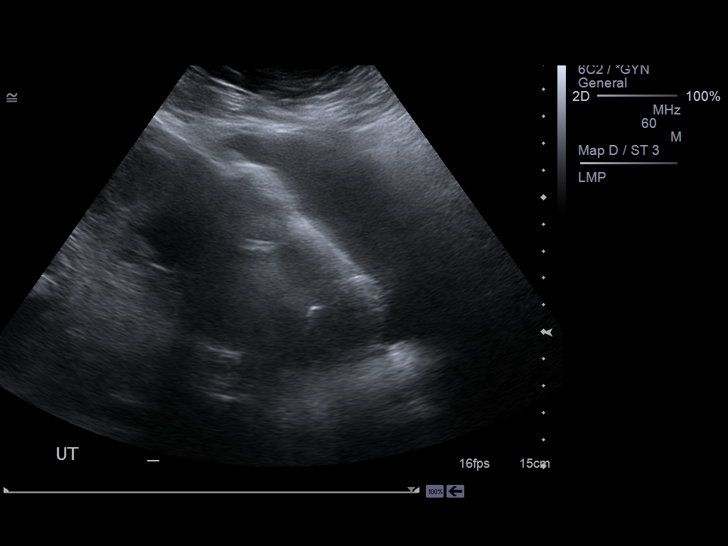
[im 9/103]
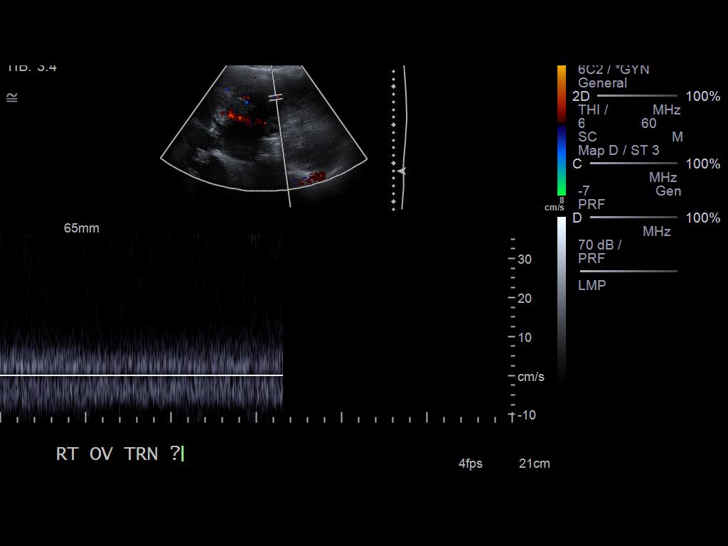
[im 18/103]
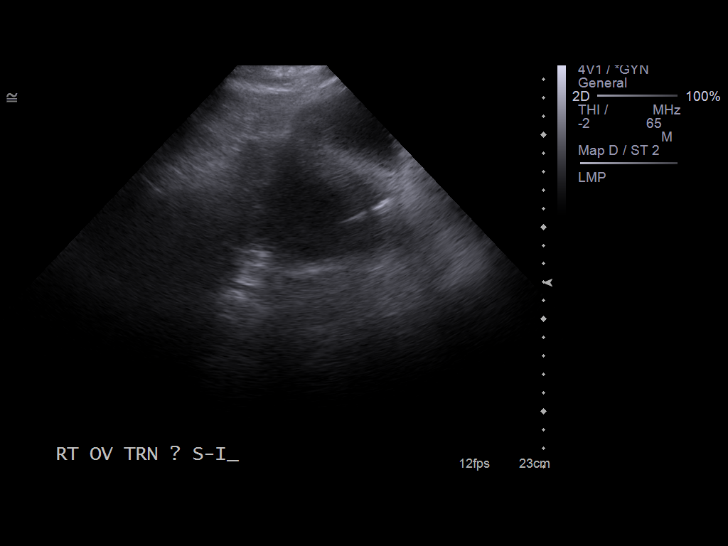
[im 26/103]
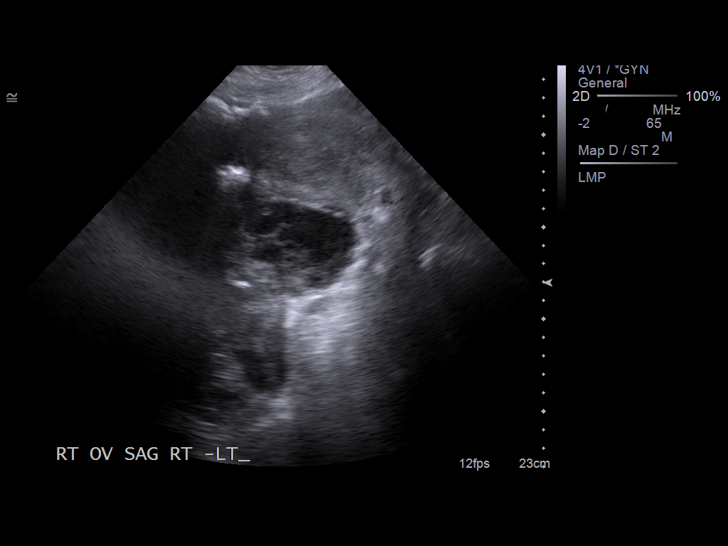
[im 35/103]
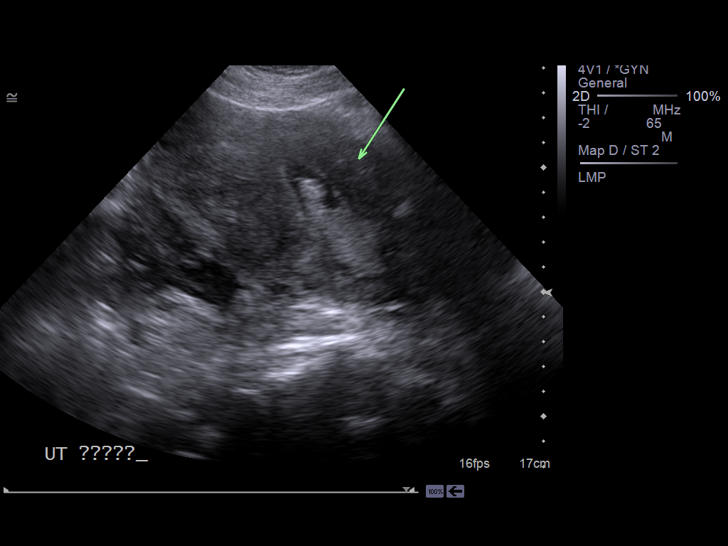
[im 43/103]
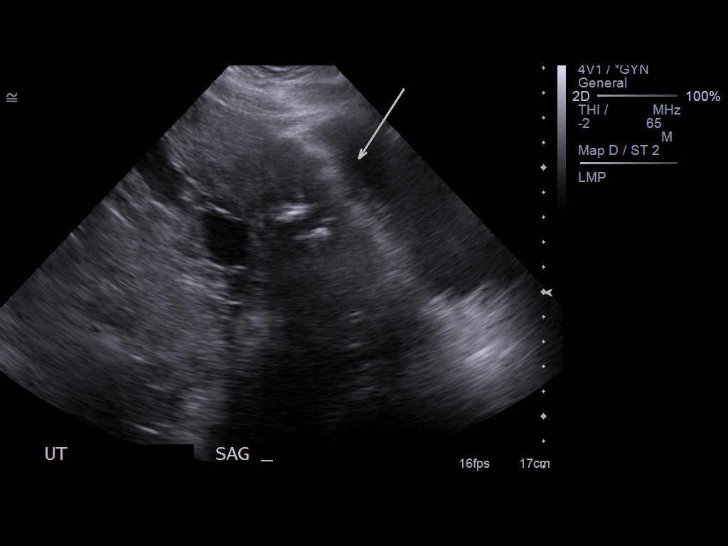
[im 52/103]
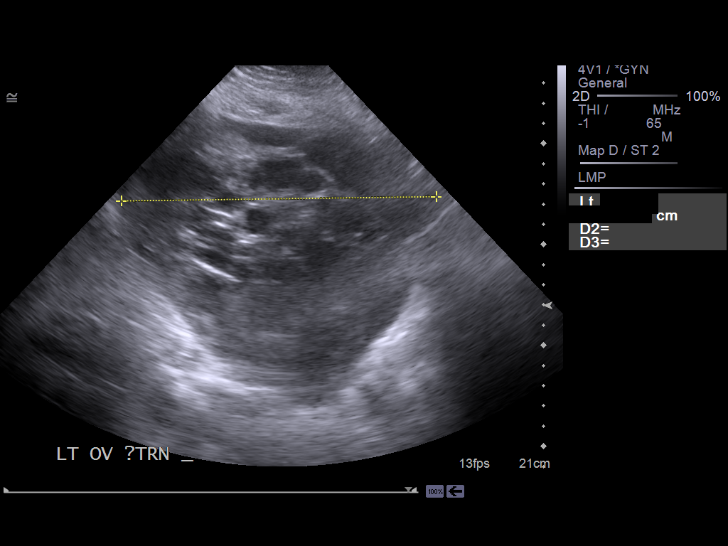
[im 60/103]
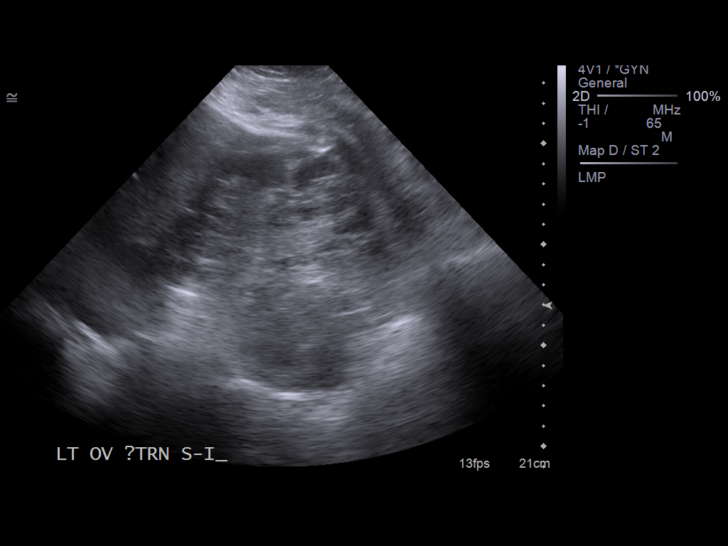
[im 69/103]
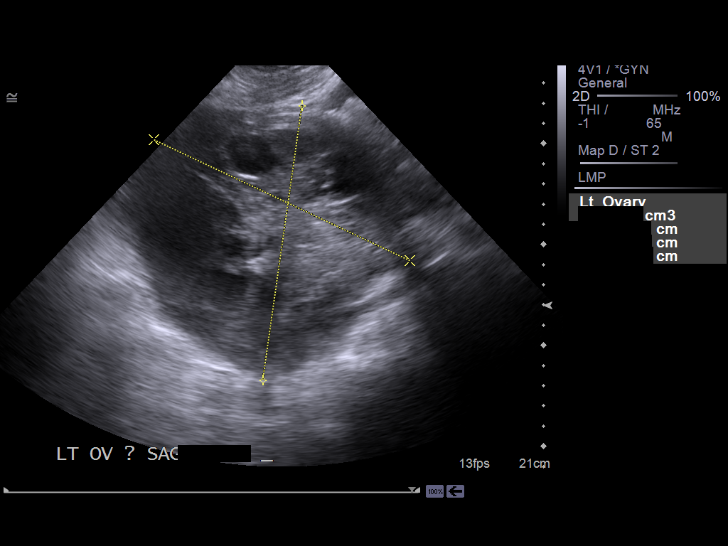
[im 77/103]
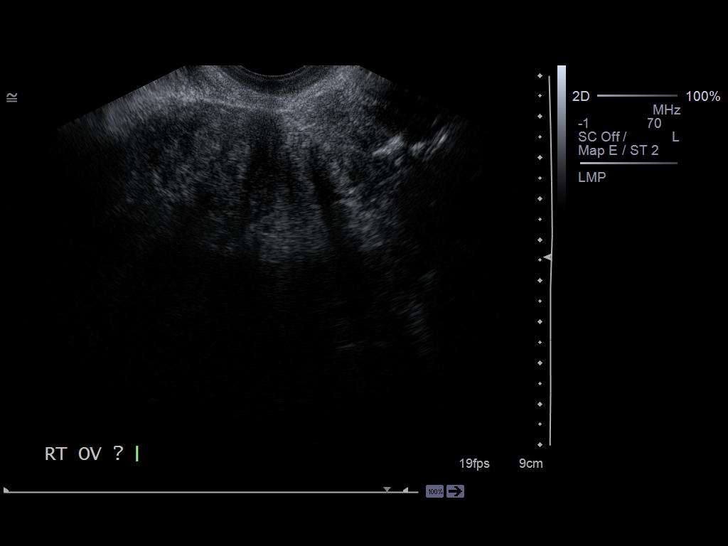
[im 86/103]
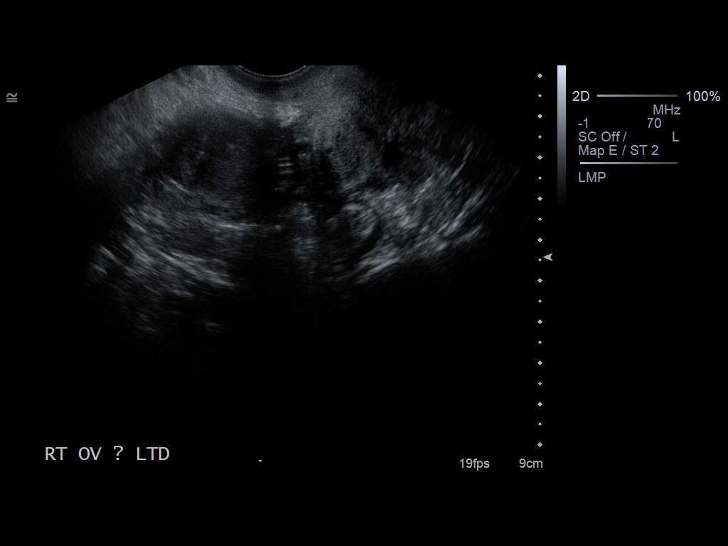
[im 94/103]
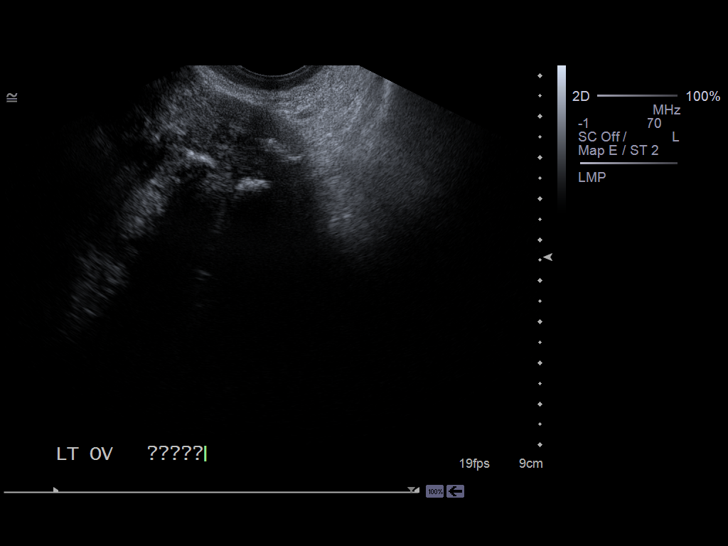
[im 103/103]
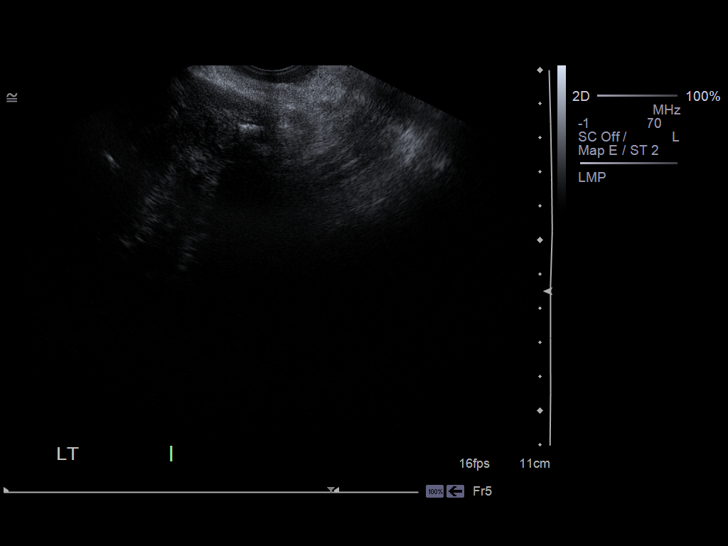

[13 of 25 positions shown; findings below may reference images not displayed]

FINDINGS: Uterus:  The uterus measures 12.4 x 6.9 x 7.7 cm.  No fibroids or
other uterine masses identified.

Endometrium:  The endometrium is thickened and measures 2.2 cm.
There is fluid in the endometrial cavity. There are multiple
calcified fibroids in the uterus.

Right ovary:  There is a 24.1 x 16.2 x 17.4 cm hypervascular
inhomogeneous solid mass lesion consistent with malignancy.

Left ovary:  There is a 15.6 x 14.0 x 13.7 cm inhomogeneous
hypervascular mass consistent with malignancy.

Pulsed Doppler evaluation demonstrates  hypervascular flow in both
masses.
IMPRESSION: Huge bilateral complex ovarian masses consistent with malignancy.
This could represent primary ovarian malignancy or metastatic
disease.

## 2012-06-10 ENCOUNTER — Other Ambulatory Visit (HOSPITAL_COMMUNITY): Payer: Medicaid Other

## 2012-06-11 ENCOUNTER — Other Ambulatory Visit (HOSPITAL_COMMUNITY): Payer: Medicaid Other

## 2012-06-13 ENCOUNTER — Ambulatory Visit (HOSPITAL_COMMUNITY): Payer: Medicaid Other | Admitting: Oncology

## 2012-06-17 ENCOUNTER — Ambulatory Visit (HOSPITAL_COMMUNITY): Payer: Medicaid Other | Admitting: Oncology

## 2012-06-18 ENCOUNTER — Other Ambulatory Visit (HOSPITAL_COMMUNITY): Payer: Medicaid Other

## 2012-06-20 ENCOUNTER — Ambulatory Visit (HOSPITAL_COMMUNITY): Payer: Medicaid Other

## 2012-06-25 ENCOUNTER — Encounter (HOSPITAL_COMMUNITY): Payer: Medicaid Other | Attending: Oncology

## 2012-06-25 DIAGNOSIS — C787 Secondary malignant neoplasm of liver and intrahepatic bile duct: Secondary | ICD-10-CM

## 2012-06-25 DIAGNOSIS — C786 Secondary malignant neoplasm of retroperitoneum and peritoneum: Secondary | ICD-10-CM

## 2012-06-25 DIAGNOSIS — C189 Malignant neoplasm of colon, unspecified: Secondary | ICD-10-CM

## 2012-06-25 DIAGNOSIS — Z452 Encounter for adjustment and management of vascular access device: Secondary | ICD-10-CM

## 2012-06-25 LAB — COMPREHENSIVE METABOLIC PANEL
Albumin: 3.9 g/dL (ref 3.5–5.2)
Alkaline Phosphatase: 58 U/L (ref 39–117)
BUN: 15 mg/dL (ref 6–23)
CO2: 28 mEq/L (ref 19–32)
Calcium: 9.9 mg/dL (ref 8.4–10.5)
Creatinine, Ser: 0.73 mg/dL (ref 0.50–1.10)
GFR calc Af Amer: >90 mL/min (ref >90)
GFR calc non Af Amer: 90 mL/min — ABNORMAL LOW (ref >90)
Glucose, Bld: 122 mg/dL — ABNORMAL HIGH (ref 70–99)
Total Protein: 7.2 g/dL (ref 6.0–8.3)

## 2012-06-25 LAB — CBC
HCT: 36.4 % (ref 36.0–46.0)
MCH: 30.2 pg (ref 26.0–34.0)
MCV: 92.4 fL (ref 78.0–100.0)
Platelets: 202 10*3/uL (ref 150–400)
RBC: 3.94 MIL/uL (ref 3.87–5.11)
WBC: 4.9 10*3/uL (ref 4.0–10.5)

## 2012-06-25 LAB — IRON AND TIBC
Iron: 84 ug/dL (ref 42–135)
Saturation Ratios: 25 % (ref 20–55)
TIBC: 332 ug/dL (ref 250–470)

## 2012-06-25 LAB — DIFFERENTIAL
Eosinophils Absolute: 0.1 10*3/uL (ref 0.0–0.7)
Eosinophils Relative: 2 % (ref 0–5)
Lymphocytes Relative: 38 % (ref 12–46)
Lymphs Abs: 1.9 10*3/uL (ref 0.7–4.0)
Monocytes Absolute: 0.4 10*3/uL (ref 0.1–1.0)

## 2012-06-25 MED ORDER — SODIUM CHLORIDE 0.9 % IJ SOLN
10.0000 mL | INTRAMUSCULAR | Status: DC | PRN
  Administered 2012-06-25: 10 mL via INTRAVENOUS
  Filled 2012-06-25: qty 10

## 2012-06-25 MED ORDER — HEPARIN SOD (PORK) LOCK FLUSH 100 UNIT/ML IV SOLN
INTRAVENOUS | Status: AC
  Filled 2012-06-25: qty 5

## 2012-06-25 MED ORDER — SODIUM CHLORIDE 0.9 % IJ SOLN
INTRAMUSCULAR | Status: AC
  Filled 2012-06-25: qty 10

## 2012-06-25 MED ORDER — HEPARIN SOD (PORK) LOCK FLUSH 100 UNIT/ML IV SOLN
500.0000 [IU] | Freq: Once | INTRAVENOUS | Status: AC
  Administered 2012-06-25: 500 [IU] via INTRAVENOUS
  Filled 2012-06-25: qty 5

## 2012-06-25 NOTE — Progress Notes (Signed)
[name] presented for Portacath access and flush. Proper placement of portacath confirmed by CXR. Portacath located right chest wall accessed with  H 20 needle. Good blood return present. Portacath flushed with 20ml NS and 500U/5ml Heparin and needle removed intact. Procedure without incident. Patient tolerated procedure well.   

## 2012-06-26 LAB — CEA: CEA: 6.2 ng/mL — ABNORMAL HIGH (ref 0.0–5.0)

## 2012-07-01 ENCOUNTER — Encounter (HOSPITAL_COMMUNITY): Payer: Self-pay

## 2012-07-08 ENCOUNTER — Inpatient Hospital Stay (HOSPITAL_COMMUNITY): Admission: RE | Admit: 2012-07-08 | Payer: Medicaid Other | Source: Ambulatory Visit

## 2012-07-08 ENCOUNTER — Ambulatory Visit (HOSPITAL_COMMUNITY): Payer: Medicaid Other | Admitting: Oncology

## 2012-07-15 ENCOUNTER — Ambulatory Visit: Admit: 2012-07-15 | Payer: Self-pay | Admitting: Ophthalmology

## 2012-07-15 SURGERY — PHACOEMULSIFICATION, CATARACT, WITH IOL INSERTION
Anesthesia: Monitor Anesthesia Care | Site: Eye | Laterality: Right

## 2012-07-30 ENCOUNTER — Other Ambulatory Visit (HOSPITAL_COMMUNITY): Payer: Self-pay | Admitting: *Deleted

## 2012-07-30 DIAGNOSIS — C189 Malignant neoplasm of colon, unspecified: Secondary | ICD-10-CM

## 2012-07-31 ENCOUNTER — Other Ambulatory Visit (HOSPITAL_COMMUNITY): Payer: Self-pay | Admitting: Oncology

## 2012-07-31 DIAGNOSIS — K219 Gastro-esophageal reflux disease without esophagitis: Secondary | ICD-10-CM

## 2012-07-31 DIAGNOSIS — I10 Essential (primary) hypertension: Secondary | ICD-10-CM

## 2012-07-31 DIAGNOSIS — C189 Malignant neoplasm of colon, unspecified: Secondary | ICD-10-CM

## 2012-07-31 MED ORDER — HYDROCODONE-ACETAMINOPHEN 10-325 MG PO TABS: 1.0000 | ORAL_TABLET | Freq: Four times a day (QID) | ORAL | Status: DC | PRN

## 2012-07-31 MED ORDER — OMEPRAZOLE 20 MG PO CPDR: 20.0000 mg | DELAYED_RELEASE_CAPSULE | Freq: Two times a day (BID) | ORAL | Status: DC

## 2012-07-31 MED ORDER — AMLODIPINE BESYLATE-VALSARTAN 5-160 MG PO TABS: 1.0000 | ORAL_TABLET | Freq: Every day | ORAL | Status: DC

## 2012-08-05 ENCOUNTER — Encounter (HOSPITAL_COMMUNITY): Payer: Self-pay | Admitting: Pharmacy Technician

## 2012-08-06 ENCOUNTER — Other Ambulatory Visit: Payer: Self-pay

## 2012-08-06 ENCOUNTER — Encounter (HOSPITAL_COMMUNITY)
Admission: RE | Admit: 2012-08-06 | Discharge: 2012-08-06 | Disposition: A | Discharge status: Home / Self Care | Payer: Medicaid Other | Source: Ambulatory Visit | Attending: Ophthalmology | Admitting: Ophthalmology

## 2012-08-06 ENCOUNTER — Encounter (HOSPITAL_COMMUNITY): Payer: Medicaid Other | Attending: Oncology

## 2012-08-06 ENCOUNTER — Encounter (HOSPITAL_COMMUNITY): Payer: Self-pay

## 2012-08-06 DIAGNOSIS — C787 Secondary malignant neoplasm of liver and intrahepatic bile duct: Secondary | ICD-10-CM

## 2012-08-06 DIAGNOSIS — C189 Malignant neoplasm of colon, unspecified: Secondary | ICD-10-CM | POA: Insufficient documentation

## 2012-08-06 DIAGNOSIS — Z452 Encounter for adjustment and management of vascular access device: Secondary | ICD-10-CM

## 2012-08-06 HISTORY — DX: Gastro-esophageal reflux disease without esophagitis: K21.9

## 2012-08-06 HISTORY — DX: Anxiety disorder, unspecified: F41.9

## 2012-08-06 HISTORY — DX: Shortness of breath: R06.02

## 2012-08-06 LAB — COMPREHENSIVE METABOLIC PANEL
ALT: 14 U/L (ref 0–35)
AST: 21 U/L (ref 0–37)
CO2: 28 mEq/L (ref 19–32)
Calcium: 9.7 mg/dL (ref 8.4–10.5)
Sodium: 138 mEq/L (ref 135–145)
Total Protein: 7.1 g/dL (ref 6.0–8.3)

## 2012-08-06 LAB — CBC WITH DIFFERENTIAL/PLATELET
Basophils Absolute: 0 10*3/uL (ref 0.0–0.1)
Eosinophils Relative: 2 % (ref 0–5)
Lymphocytes Relative: 35 % (ref 12–46)
MCV: 93.4 fL (ref 78.0–100.0)
Neutrophils Relative %: 56 % (ref 43–77)
Platelets: 215 10*3/uL (ref 150–400)
RDW: 14.8 % (ref 11.5–15.5)
WBC: 5.3 10*3/uL (ref 4.0–10.5)

## 2012-08-06 MED ORDER — HEPARIN SOD (PORK) LOCK FLUSH 100 UNIT/ML IV SOLN
INTRAVENOUS | Status: AC
  Filled 2012-08-06: qty 5

## 2012-08-06 MED ORDER — SODIUM CHLORIDE 0.9 % IJ SOLN
INTRAMUSCULAR | Status: AC
  Filled 2012-08-06: qty 10

## 2012-08-06 MED ORDER — SODIUM CHLORIDE 0.9 % IJ SOLN
10.0000 mL | INTRAMUSCULAR | Status: DC | PRN
  Administered 2012-08-06: 10 mL via INTRAVENOUS
  Filled 2012-08-06: qty 10

## 2012-08-06 MED ORDER — HEPARIN SOD (PORK) LOCK FLUSH 100 UNIT/ML IV SOLN
500.0000 [IU] | Freq: Once | INTRAVENOUS | Status: AC
  Administered 2012-08-06: 500 [IU] via INTRAVENOUS
  Filled 2012-08-06: qty 5

## 2012-08-06 NOTE — Progress Notes (Signed)
Yolanda Friedman presented for Portacath access and flush. Proper placement of portacath confirmed by CXR. Portacath located rt chest wall accessed with  H 20 needle. Good blood return present. Portacath flushed with 20ml NS and 500U/5ml Heparin and needle removed intact. Procedure without incident. Patient tolerated procedure well.   

## 2012-08-06 NOTE — Patient Instructions (Addendum)
Your procedure is scheduled on: 08/08/2012  Report to [facility] at  0930     AM.  Call this number if you have problems the morning of surgery: [phone]   Do not eat food or drink liquids :After Midnight.      Take these medicines the morning of surgery with A SIP OF WATER:norco,ativan,neurontin,prilosec,zofran   Do not wear jewelry, make-up or nail polish.  Do not wear lotions, powders, or perfumes. You may wear deodorant.  Do not shave 48 hours prior to surgery.  Do not bring valuables to the hospital.  Contacts, dentures or bridgework may not be worn into surgery.  Leave suitcase in the car. After surgery it may be brought to your room.  For patients admitted to the hospital, checkout time is 11:00 AM the day of discharge.   Patients discharged the day of surgery will not be allowed to drive home.  :     Please read over the following fact sheets that you were given: Coughing and Deep Breathing, Surgical Site Infection Prevention, Anesthesia Post-op Instructions and Care and Recovery After Surgery    Cataract A cataract is a clouding of the lens of the eye. When a lens becomes cloudy, vision is reduced based on the degree and nature of the clouding. Many cataracts reduce vision to some degree. Some cataracts make people more near-sighted as they develop. Other cataracts increase glare. Cataracts that are ignored and become worse can sometimes look white. The white color can be seen through the pupil. CAUSES   Aging. However, cataracts may occur at any age, even in newborns.   Certain drugs.   Trauma to the eye.   Certain diseases such as diabetes.   Specific eye diseases such as chronic inflammation inside the eye or a sudden attack of a rare form of glaucoma.   Inherited or acquired medical problems.  SYMPTOMS   Gradual, progressive drop in vision in the affected eye.   Severe, rapid visual loss. This most often happens when trauma is the cause.  DIAGNOSIS  To detect  a cataract, an eye doctor examines the lens. Cataracts are best diagnosed with an exam of the eyes with the pupils enlarged (dilated) by drops.  TREATMENT  For an early cataract, vision may improve by using different eyeglasses or stronger lighting. If that does not help your vision, surgery is the only effective treatment. A cataract needs to be surgically removed when vision loss interferes with your everyday activities, such as driving, reading, or watching TV. A cataract may also have to be removed if it prevents examination or treatment of another eye problem. Surgery removes the cloudy lens and usually replaces it with a substitute lens (intraocular lens, IOL).  At a time when both you and your doctor agree, the cataract will be surgically removed. If you have cataracts in both eyes, only one is usually removed at a time. This allows the operated eye to heal and be out of danger from any possible problems after surgery (such as infection or poor wound healing). In rare cases, a cataract may be doing damage to your eye. In these cases, your caregiver may advise surgical removal right away. The vast majority of people who have cataract surgery have better vision afterward. HOME CARE INSTRUCTIONS  If you are not planning surgery, you may be asked to do the following:  Use different eyeglasses.   Use stronger or brighter lighting.   Ask your eye doctor about reducing your medicine  dose or changing medicines if it is thought that a medicine caused your cataract. Changing medicines does not make the cataract go away on its own.   Become familiar with your surroundings. Poor vision can lead to injury. Avoid bumping into things on the affected side. You are at a higher risk for tripping or falling.   Exercise extreme care when driving or operating machinery.   Wear sunglasses if you are sensitive to bright light or experiencing problems with glare.  SEEK IMMEDIATE MEDICAL CARE IF:   You have a  worsening or sudden vision loss.   You notice redness, swelling, or increasing pain in the eye.   You have a fever.  Document Released: 08/21/2005 Document Revised: 08/10/2011 Document Reviewed: 04/14/2011 [facility] Patient Information 2012 [place], [place].PATIENT INSTRUCTIONS POST-ANESTHESIA  IMMEDIATELY FOLLOWING SURGERY:  Do not drive or operate machinery for the first twenty four hours after surgery.  Do not make any important decisions for twenty four hours after surgery or while taking narcotic pain medications or sedatives.  If you develop intractable nausea and vomiting or a severe headache please notify your doctor immediately.  FOLLOW-UP:  Please make an appointment with your surgeon as instructed. You do not need to follow up with anesthesia unless specifically instructed to do so.  WOUND CARE INSTRUCTIONS (if applicable):  Keep a dry clean dressing on the anesthesia/puncture wound site if there is drainage.  Once the wound has quit draining you may leave it open to air.  Generally you should leave the bandage intact for twenty four hours unless there is drainage.  If the epidural site drains for more than 36-48 hours please call the anesthesia department.  QUESTIONS?:  Please feel free to call your physician or the hospital operator if you have any questions, and they will be happy to assist you.

## 2012-08-07 ENCOUNTER — Telehealth (HOSPITAL_COMMUNITY): Payer: Self-pay | Admitting: Oncology

## 2012-08-07 MED ORDER — LIDOCAINE HCL (PF) 1 % IJ SOLN
INTRAMUSCULAR | Status: AC
  Filled 2012-08-07: qty 2

## 2012-08-07 MED ORDER — CYCLOPENTOLATE-PHENYLEPHRINE 0.2-1 % OP SOLN
OPHTHALMIC | Status: AC
  Filled 2012-08-07: qty 2

## 2012-08-07 MED ORDER — NEOMYCIN-POLYMYXIN-DEXAMETH 3.5-10000-0.1 OP OINT
TOPICAL_OINTMENT | OPHTHALMIC | Status: AC
  Filled 2012-08-07: qty 3.5

## 2012-08-07 MED ORDER — PHENYLEPHRINE HCL 2.5 % OP SOLN
OPHTHALMIC | Status: AC
  Filled 2012-08-07: qty 2

## 2012-08-07 MED ORDER — LIDOCAINE HCL 3.5 % OP GEL
OPHTHALMIC | Status: AC
  Filled 2012-08-07: qty 5

## 2012-08-07 MED ORDER — TETRACAINE HCL 0.5 % OP SOLN
OPHTHALMIC | Status: AC
  Filled 2012-08-07: qty 2

## 2012-08-07 NOTE — Telephone Encounter (Signed)
I personally reviewed and went over laboratory results with the patient's sister, Lemmie Evens [phone]).  I expressed our concern with her increasing CEA level and our need to pursue CT scans.  She is agreeable to this and thankful for the information.  She wanted to thank the [facility] for the care we provide her sister.  KEFALAS,THOMAS

## 2012-08-07 NOTE — Telephone Encounter (Signed)
Discussed CEA results with patient.  She is "getting her eyes worked on" and wishes to wait until after the New Year to have a CT scan performed.  Her CEA has gone from 2.6 in may 2013 with a long break in lab work because she went "out of town" then 6.2 in October 2013, and now 10.0 (Dec 2013).  This is concerning for recurrence and this was shared with Dahlia Client.  She will have CTs after the New Year with follow-up appointment accordingly.  KEFALAS,THOMAS

## 2012-08-08 ENCOUNTER — Encounter (HOSPITAL_COMMUNITY): Payer: Self-pay | Admitting: Anesthesiology

## 2012-08-08 ENCOUNTER — Encounter (HOSPITAL_COMMUNITY): Payer: Self-pay | Admitting: *Deleted

## 2012-08-08 ENCOUNTER — Ambulatory Visit (HOSPITAL_COMMUNITY): Payer: Medicaid Other | Admitting: Anesthesiology

## 2012-08-08 ENCOUNTER — Encounter (HOSPITAL_COMMUNITY)
Admission: RE | Disposition: A | Discharge status: Home / Self Care | Payer: Self-pay | Source: Ambulatory Visit | Attending: Ophthalmology

## 2012-08-08 ENCOUNTER — Ambulatory Visit (HOSPITAL_COMMUNITY)
Admission: RE | Admit: 2012-08-08 | Discharge: 2012-08-08 | Disposition: A | Discharge status: Home / Self Care | Payer: Medicaid Other | Source: Ambulatory Visit | Attending: Ophthalmology | Admitting: Ophthalmology

## 2012-08-08 DIAGNOSIS — I10 Essential (primary) hypertension: Secondary | ICD-10-CM | POA: Insufficient documentation

## 2012-08-08 DIAGNOSIS — Z01812 Encounter for preprocedural laboratory examination: Secondary | ICD-10-CM | POA: Insufficient documentation

## 2012-08-08 DIAGNOSIS — Z01810 Encounter for preprocedural cardiovascular examination: Secondary | ICD-10-CM | POA: Insufficient documentation

## 2012-08-08 DIAGNOSIS — H2589 Other age-related cataract: Secondary | ICD-10-CM | POA: Insufficient documentation

## 2012-08-08 DIAGNOSIS — E119 Type 2 diabetes mellitus without complications: Secondary | ICD-10-CM | POA: Insufficient documentation

## 2012-08-08 HISTORY — PX: CATARACT EXTRACTION W/PHACO: SHX586

## 2012-08-08 SURGERY — PHACOEMULSIFICATION, CATARACT, WITH IOL INSERTION
Anesthesia: Monitor Anesthesia Care | Site: Eye | Laterality: Right | Wound class: Clean

## 2012-08-08 MED ORDER — TRYPAN BLUE 0.06 % OP SOLN
OPHTHALMIC | Status: AC
  Filled 2012-08-08: qty 0.5

## 2012-08-08 MED ORDER — LACTATED RINGERS IV SOLN
INTRAVENOUS | Status: DC
  Administered 2012-08-08: 10:00:00 via INTRAVENOUS

## 2012-08-08 MED ORDER — LIDOCAINE HCL (PF) 1 % IJ SOLN
INTRAMUSCULAR | Status: DC | PRN
  Administered 2012-08-08: .5 mL

## 2012-08-08 MED ORDER — MIDAZOLAM HCL 2 MG/2ML IJ SOLN
INTRAMUSCULAR | Status: AC
  Filled 2012-08-08: qty 2

## 2012-08-08 MED ORDER — BSS IO SOLN
INTRAOCULAR | Status: DC | PRN
  Administered 2012-08-08: 15 mL via INTRAOCULAR

## 2012-08-08 MED ORDER — LIDOCAINE HCL 3.5 % OP GEL
1.0000 "application " | Freq: Once | OPHTHALMIC | Status: AC
  Administered 2012-08-08: 1 via OPHTHALMIC

## 2012-08-08 MED ORDER — TRYPAN BLUE 0.06 % OP SOLN
OPHTHALMIC | Status: DC | PRN
  Administered 2012-08-08: 0.5 mL via INTRAOCULAR

## 2012-08-08 MED ORDER — POVIDONE-IODINE 5 % OP SOLN
OPHTHALMIC | Status: DC | PRN
  Administered 2012-08-08: 1 via OPHTHALMIC

## 2012-08-08 MED ORDER — LACTATED RINGERS IV SOLN
INTRAVENOUS | Status: DC | PRN
  Administered 2012-08-08: 10:00:00 via INTRAVENOUS

## 2012-08-08 MED ORDER — EPINEPHRINE HCL 1 MG/ML IJ SOLN
INTRAOCULAR | Status: DC | PRN
  Administered 2012-08-08: 11:00:00

## 2012-08-08 MED ORDER — LIDOCAINE 3.5 % OP GEL OPTIME - NO CHARGE
OPHTHALMIC | Status: DC | PRN
  Administered 2012-08-08: 1 [drp] via OPHTHALMIC

## 2012-08-08 MED ORDER — PHENYLEPHRINE HCL 2.5 % OP SOLN
1.0000 [drp] | OPHTHALMIC | Status: AC
  Administered 2012-08-08 (×3): 1 [drp] via OPHTHALMIC

## 2012-08-08 MED ORDER — MIDAZOLAM HCL 2 MG/2ML IJ SOLN
1.0000 mg | INTRAMUSCULAR | Status: DC | PRN
Start: 2012-08-08 — End: 2012-08-08
  Administered 2012-08-08 (×2): 2 mg via INTRAVENOUS

## 2012-08-08 MED ORDER — TETRACAINE HCL 0.5 % OP SOLN
1.0000 [drp] | OPHTHALMIC | Status: AC
  Administered 2012-08-08 (×3): 1 [drp] via OPHTHALMIC

## 2012-08-08 MED ORDER — NEOMYCIN-POLYMYXIN-DEXAMETH 0.1 % OP OINT
TOPICAL_OINTMENT | OPHTHALMIC | Status: DC | PRN
  Administered 2012-08-08: 1 via OPHTHALMIC

## 2012-08-08 MED ORDER — PROVISC 10 MG/ML IO SOLN
INTRAOCULAR | Status: DC | PRN
  Administered 2012-08-08: 8.5 mg via INTRAOCULAR

## 2012-08-08 MED ORDER — EPINEPHRINE HCL 1 MG/ML IJ SOLN
INTRAMUSCULAR | Status: AC
  Filled 2012-08-08: qty 1

## 2012-08-08 MED ORDER — CYCLOPENTOLATE-PHENYLEPHRINE 0.2-1 % OP SOLN
1.0000 [drp] | OPHTHALMIC | Status: AC
  Administered 2012-08-08 (×3): 1 [drp] via OPHTHALMIC

## 2012-08-08 SURGICAL SUPPLY — 32 items

## 2012-08-08 NOTE — Transfer of Care (Signed)
  Anesthesia Post-op Note  Patient: Yolanda Friedman  Procedure(s) Performed: Procedure(s) (LRB) with comments: CATARACT EXTRACTION PHACO AND INTRAOCULAR LENS PLACEMENT (IOC) (Right) - CDE:16.18  Patient Location:Short stay  Anesthesia Type: MAC  Level of Consciousness: awake, alert , oriented and patient cooperative  Airway and Oxygen Therapy: Patient Spontanous Breathing room air  Post-op Pain: mild  Post-op Assessment: Post-op Vital signs reviewed, Patient's Cardiovascular Status Stable, Respiratory Function Stable, Patent Airway and No signs of Nausea or vomiting  Post-op Vital Signs: Reviewed and stable  Complications: No apparent anesthesia complications

## 2012-08-08 NOTE — H&P (Signed)
I have reviewed the H&P, the patient was re-examined, and I have identified no interval changes in medical condition and plan of care since the history and physical of record  

## 2012-08-08 NOTE — Brief Op Note (Signed)
Pre-Op Dx: Cataract OD Post-Op Dx: Cataract OD Surgeon: Britainy Kozub Anesthesia: Topical with MAC Surgery: Cataract Extraction with Intraocular lens Implant OD Implant: B&L enVista Specimen: None Complications: None 

## 2012-08-08 NOTE — Preoperative (Signed)
Beta Blockers   Reason not to administer Beta Blockers:Not Applicable 

## 2012-08-08 NOTE — Anesthesia Preprocedure Evaluation (Signed)
Anesthesia Evaluation  Patient identified by MRN, date of birth, ID band Patient awake    Reviewed: Allergy & Precautions, H&P , NPO status , Patient's Chart, lab work & pertinent test results  Airway Mallampati: II      Dental  (+) Partial Lower and Edentulous Upper   Pulmonary shortness of breath and with exertion,  breath sounds clear to auscultation        Cardiovascular hypertension, Pt. on medications Rhythm:Regular Rate:Normal     Neuro/Psych    GI/Hepatic GERD-  Medicated and Controlled,  Endo/Other  diabetes, Well Controlled, Type 2, Oral Hypoglycemic Agents  Renal/GU      Musculoskeletal   Abdominal   Peds  Hematology   Anesthesia Other Findings   Reproductive/Obstetrics                           Anesthesia Physical Anesthesia Plan  ASA: III  Anesthesia Plan: MAC   Post-op Pain Management:    Induction: Intravenous  Airway Management Planned: Nasal Cannula  Additional Equipment:   Intra-op Plan:   Post-operative Plan:   Informed Consent: I have reviewed the patients History and Physical, chart, labs and discussed the procedure including the risks, benefits and alternatives for the proposed anesthesia with the patient or authorized representative who has indicated his/her understanding and acceptance.     Plan Discussed with:   Anesthesia Plan Comments:         Anesthesia Quick Evaluation

## 2012-08-08 NOTE — Anesthesia Procedure Notes (Signed)
Procedure Name: MAC Performed by: ANDRAZA, Sanvika Cuttino L Pre-anesthesia Checklist: Patient identified, Patient being monitored, Emergency Drugs available, Timeout performed and Suction available Patient Re-evaluated:Patient Re-evaluated prior to inductionOxygen Delivery Method: Nasal cannula     

## 2012-08-08 NOTE — Anesthesia Postprocedure Evaluation (Signed)
  Anesthesia Post-op Note  Patient: Yolanda Friedman  Procedure(s) Performed: Procedure(s) (LRB) with comments: CATARACT EXTRACTION PHACO AND INTRAOCULAR LENS PLACEMENT (IOC) (Right) - CDE:16.18  Patient Location:Short stay  Anesthesia Type: MAC  Level of Consciousness: awake, alert , oriented and patient cooperative  Airway and Oxygen Therapy: Patient Spontanous Breathing room air  Post-op Pain: mild  Post-op Assessment: Post-op Vital signs reviewed, Patient's Cardiovascular Status Stable, Respiratory Function Stable, Patent Airway and No signs of Nausea or vomiting  Post-op Vital Signs: Reviewed and stable  Complications: No apparent anesthesia complications  

## 2012-08-09 NOTE — Op Note (Signed)
NAME:  Yolanda Friedman, Yolanda Friedman NO.:  [id number]  MEDICAL RECORD NO.:  [id number]  LOCATION:  [facility]                          FACILITY:  [facility]  PHYSICIAN:  Susanne Greenhouse, MD       DATE OF BIRTH:  [birth year]  DATE OF PROCEDURE:  08/08/2012 DATE OF DISCHARGE:  08/08/2012                              OPERATIVE REPORT   PREOPERATIVE DIAGNOSIS:  Combined cataract, right eye, diagnosis code 366.19.  POSTOPERATIVE DIAGNOSIS:  Combined cataract, right eye, diagnosis code 366.19.  OPERATION PERFORMED:  Phacoemulsification with posterior chamber intraocular lens implantation, right eye.  SURGEON:  Bonne Dolores. Olyver Hawes, MD  ANESTHESIA:  Topical with monitored anesthesia care and IV sedation.  OPERATIVE SUMMARY:  In the preoperative holding area, dilating drops and viscous lidocaine were placed into the right eye.  The patient was then brought into the operating room where she was prepped and draped. Beginning with a 75 blade, a paracentesis port was made at the surgeon's 2 o'clock position.  The anterior chamber was then filled with a 1% nonpreserved lidocaine solution.  Because of the poor red reflex, decision was made to use vision blue.  Vision blue was then placed into the anterior chamber after adequate time to stay in the capsule.  The vision blue was rinsed from the anterior chamber with balanced salt solution.  The anterior chamber was then filled with Provisc.  A 2.4 mm Keratome blade was then used to make a clear corneal incision at the __________.  A bent cystotome needle and Utrata forceps were used to create a continuous tear capsulotomy.  Hydrodissection was performed using balanced salt solution on a fine cannula.  The lens was removed using phacoemulsification in a quadrant cracking technique.  Residual cortex was removed with the irrigation and aspiration handpiece.  The capsular bag and anterior chamber were refilled with Provisc and a posterior chamber lens was placed  into the capsular bag without difficulty using lens injecting system.  The Provisc was then removed from the capsular bag and anterior chamber with irrigation and aspiration handpiece.  Stromal hydration of the main incision and paracentesis ports was performed a the balanced salt solution in a fine cannula.  The wounds were tested for leak, which were negative.  The patient tolerated the procedure well.  There were no operative complications and she was returned to recovery in a satisfactory condition.  Prosthetic device used is a [employer], [place], posterior chamber lens, model MX60, power of 21.5, serial number is [id number].          ______________________________ Susanne Greenhouse, MD     KEH/MEDQ  D:  08/08/2012  T:  08/09/2012  Job:  [id number]

## 2012-08-12 ENCOUNTER — Encounter (HOSPITAL_COMMUNITY): Payer: Self-pay | Admitting: Ophthalmology

## 2012-08-14 ENCOUNTER — Other Ambulatory Visit (HOSPITAL_COMMUNITY): Payer: Medicaid Other

## 2012-08-15 ENCOUNTER — Encounter (HOSPITAL_COMMUNITY): Payer: Self-pay | Admitting: Pharmacy Technician

## 2012-08-16 ENCOUNTER — Ambulatory Visit (HOSPITAL_COMMUNITY): Payer: Medicaid Other | Admitting: Oncology

## 2012-08-21 ENCOUNTER — Ambulatory Visit (HOSPITAL_COMMUNITY): Payer: Medicaid Other | Admitting: Oncology

## 2012-08-21 ENCOUNTER — Encounter (HOSPITAL_COMMUNITY)
Admission: RE | Admit: 2012-08-21 | Discharge: 2012-08-21 | Payer: Medicaid Other | Source: Ambulatory Visit | Admitting: Ophthalmology

## 2012-08-21 ENCOUNTER — Encounter (HOSPITAL_COMMUNITY): Payer: Self-pay

## 2012-08-26 ENCOUNTER — Encounter (HOSPITAL_COMMUNITY): Payer: Self-pay | Admitting: Anesthesiology

## 2012-08-26 ENCOUNTER — Ambulatory Visit (HOSPITAL_COMMUNITY)
Admission: RE | Admit: 2012-08-26 | Discharge: 2012-08-26 | Disposition: A | Discharge status: Home / Self Care | Payer: Medicaid Other | Source: Ambulatory Visit | Attending: Ophthalmology | Admitting: Ophthalmology

## 2012-08-26 ENCOUNTER — Encounter (HOSPITAL_COMMUNITY)
Admission: RE | Disposition: A | Discharge status: Home / Self Care | Payer: Self-pay | Source: Ambulatory Visit | Attending: Ophthalmology

## 2012-08-26 ENCOUNTER — Encounter (HOSPITAL_COMMUNITY): Payer: Self-pay | Admitting: *Deleted

## 2012-08-26 ENCOUNTER — Ambulatory Visit (HOSPITAL_COMMUNITY): Payer: Medicaid Other | Admitting: Anesthesiology

## 2012-08-26 DIAGNOSIS — E119 Type 2 diabetes mellitus without complications: Secondary | ICD-10-CM | POA: Insufficient documentation

## 2012-08-26 DIAGNOSIS — I10 Essential (primary) hypertension: Secondary | ICD-10-CM | POA: Insufficient documentation

## 2012-08-26 DIAGNOSIS — H2589 Other age-related cataract: Secondary | ICD-10-CM | POA: Insufficient documentation

## 2012-08-26 HISTORY — PX: CATARACT EXTRACTION W/PHACO: SHX586

## 2012-08-26 SURGERY — PHACOEMULSIFICATION, CATARACT, WITH IOL INSERTION
Anesthesia: Monitor Anesthesia Care | Site: Eye | Laterality: Left | Wound class: Clean

## 2012-08-26 MED ORDER — PHENYLEPHRINE HCL 2.5 % OP SOLN
1.0000 [drp] | OPHTHALMIC | Status: AC
  Administered 2012-08-26 (×3): 1 [drp] via OPHTHALMIC

## 2012-08-26 MED ORDER — POVIDONE-IODINE 5 % OP SOLN
OPHTHALMIC | Status: DC | PRN
  Administered 2012-08-26: 1 via OPHTHALMIC

## 2012-08-26 MED ORDER — CYCLOPENTOLATE-PHENYLEPHRINE 0.2-1 % OP SOLN
1.0000 [drp] | OPHTHALMIC | Status: AC
  Administered 2012-08-26 (×3): 1 [drp] via OPHTHALMIC

## 2012-08-26 MED ORDER — BSS IO SOLN
INTRAOCULAR | Status: DC | PRN
  Administered 2012-08-26: 15 mL via INTRAOCULAR

## 2012-08-26 MED ORDER — EPINEPHRINE HCL 1 MG/ML IJ SOLN
INTRAOCULAR | Status: DC | PRN
  Administered 2012-08-26: 09:00:00

## 2012-08-26 MED ORDER — LIDOCAINE HCL (PF) 1 % IJ SOLN
INTRAMUSCULAR | Status: DC | PRN
  Administered 2012-08-26: .5 mL

## 2012-08-26 MED ORDER — LIDOCAINE HCL 3.5 % OP GEL
1.0000 "application " | Freq: Once | OPHTHALMIC | Status: AC
  Administered 2012-08-26: 1 via OPHTHALMIC

## 2012-08-26 MED ORDER — TRYPAN BLUE 0.06 % OP SOLN
OPHTHALMIC | Status: AC
  Filled 2012-08-26: qty 0.5

## 2012-08-26 MED ORDER — PROVISC 10 MG/ML IO SOLN
INTRAOCULAR | Status: DC | PRN
Start: 2012-08-26 — End: 2012-08-26
  Administered 2012-08-26: 8.5 mg via INTRAOCULAR

## 2012-08-26 MED ORDER — EPINEPHRINE HCL 1 MG/ML IJ SOLN
INTRAMUSCULAR | Status: AC
  Filled 2012-08-26: qty 1

## 2012-08-26 MED ORDER — MIDAZOLAM HCL 2 MG/2ML IJ SOLN
INTRAMUSCULAR | Status: AC
  Filled 2012-08-26: qty 2

## 2012-08-26 MED ORDER — TRYPAN BLUE 0.06 % OP SOLN
OPHTHALMIC | Status: DC | PRN
  Administered 2012-08-26: 0.5 mL via INTRAOCULAR

## 2012-08-26 MED ORDER — LACTATED RINGERS IV SOLN
INTRAVENOUS | Status: DC
  Administered 2012-08-26: 08:00:00 via INTRAVENOUS

## 2012-08-26 MED ORDER — TETRACAINE HCL 0.5 % OP SOLN
1.0000 [drp] | OPHTHALMIC | Status: AC
  Administered 2012-08-26 (×3): 1 [drp] via OPHTHALMIC

## 2012-08-26 MED ORDER — MIDAZOLAM HCL 2 MG/2ML IJ SOLN
1.0000 mg | INTRAMUSCULAR | Status: DC | PRN
  Administered 2012-08-26: 2 mg via INTRAVENOUS

## 2012-08-26 MED ORDER — NEOMYCIN-POLYMYXIN-DEXAMETH 0.1 % OP OINT
TOPICAL_OINTMENT | OPHTHALMIC | Status: DC | PRN
  Administered 2012-08-26: 1 via OPHTHALMIC

## 2012-08-26 SURGICAL SUPPLY — 32 items

## 2012-08-26 NOTE — Transfer of Care (Signed)
Immediate Anesthesia Transfer of Care Note  Patient: Yolanda Friedman  Procedure(s) Performed: Procedure(s) (LRB): CATARACT EXTRACTION PHACO AND INTRAOCULAR LENS PLACEMENT (IOC) (Left)  Patient Location: Shortstay  Anesthesia Type: MAC  Level of Consciousness: awake  Airway & Oxygen Therapy: Patient Spontanous Breathing   Post-op Assessment: Report given to PACU RN, Post -op Vital signs reviewed and stable and Patient moving all extremities  Post vital signs: Reviewed and stable  Complications: No apparent anesthesia complications

## 2012-08-26 NOTE — Anesthesia Postprocedure Evaluation (Signed)
  Anesthesia Post-op Note  Patient: Yolanda Friedman  Procedure(s) Performed: Procedure(s) (LRB): CATARACT EXTRACTION PHACO AND INTRAOCULAR LENS PLACEMENT (IOC) (Left)  Patient Location:  Short Stay  Anesthesia Type: MAC  Level of Consciousness: awake  Airway and Oxygen Therapy: Patient Spontanous Breathing  Post-op Pain: none  Post-op Assessment: Post-op Vital signs reviewed, Patient's Cardiovascular Status Stable, Respiratory Function Stable, Patent Airway, No signs of Nausea or vomiting and Pain level controlled  Post-op Vital Signs: Reviewed and stable  Complications: No apparent anesthesia complications

## 2012-08-26 NOTE — H&P (Signed)
I have reviewed the H&P, the patient was re-examined, and I have identified no interval changes in medical condition and plan of care since the history and physical of record  

## 2012-08-26 NOTE — Anesthesia Procedure Notes (Signed)
Procedure Name: MAC Date/Time: 08/26/2012 9:08 AM Performed by: Franco Nones Pre-anesthesia Checklist: Patient identified, Emergency Drugs available, Suction available, Timeout performed and Patient being monitored Patient Re-evaluated:Patient Re-evaluated prior to inductionOxygen Delivery Method: Nasal Cannula

## 2012-08-26 NOTE — Brief Op Note (Signed)
Pre-Op Dx: Cataract OS Post-Op Dx: Cataract OS Surgeon: Yoshi Vicencio Anesthesia: Topical with MAC Surgery: Cataract Extraction with Intraocular lens Implant OS Implant: B&L enVista Specimen: None Complications: None 

## 2012-08-26 NOTE — Op Note (Signed)
NAME:  DESTENIE, INGBER NO.:  [id number]  MEDICAL RECORD NO.:  [id number]  LOCATION:  [facility]                          FACILITY:  [facility]  PHYSICIAN:  Susanne Greenhouse, MD       DATE OF BIRTH:  November 15, 1950  DATE OF PROCEDURE:  08/26/2012 DATE OF DISCHARGE:  08/26/2012                              OPERATIVE REPORT   PREOPERATIVE DIAGNOSIS:  Combined cataract, left eye, diagnosis code 366.19.  POSTOPERATIVE DIAGNOSIS:  Combined cataract, left eye, diagnosis code 366.19.  OPERATION PERFORMED:  Phacoemulsification with posterior chamber intraocular lens implantation, left eye.  SURGEON:  Bonne Dolores. Xavier Munger, MD  ANESTHESIA:  Topical with monitored anesthesia care and IV sedation.  DESCRIPTION OF THE OPERATION:  In the preoperative holding area, dilating drops and viscous lidocaine were placed into the left eye.  The patient was then brought to the operating room where she was prepped and draped.  Beginning with a 75 blade, a paracentesis port was made at the surgeon's 2 o'clock position.  The anterior chamber was then filled with a 1% nonpreserved lidocaine solution.  This was followed by filling the anterior chamber with VisionBlue to stain the anterior capsule due to a poor red reflex.  The VisionBlue was displaced from the anterior chamber with balanced salt solution on a fine cannula.  The anterior chamber was then filled with Provisc.  A 2.4-mm keratome blade was then used to make a clear corneal incision at the temporal limbus.  A bent cystotome needle and Utrata forceps were used to create a continuous tear capsulotomy.  Hydrodissection was performed with balanced salt solution on a fine cannula.  The lens nucleus was prolapsed into the anterior chamber with the hydrodissection and the lens nucleus was removed with the phacoemulsification handpiece using a chip and flip technique.  The residual cortex was removed with irrigation and aspiration handpiece. Anterior chamber  and capsular bag were refilled with Provisc.  A posterior chamber intraocular lens was placed into the capsular bag without difficulty using its lens injecting system.  The Provisc was then removed from the capsular bag and anterior chamber with irrigation and aspiration handpiece.  Stromal hydration of the main incision and paracentesis ports was performed with balanced salt solution on a fine cannula.  The wounds were tested for leak, which were negative. The patient tolerated the procedure well.  There were no operative complications, and she was returned to the recovery area in satisfactory condition.  No surgical specimens.  Prosthetic device used is a [employer], model EnVista, model number MX60, power of 22.0, serial number is [id number].          ______________________________ Susanne Greenhouse, MD     KEH/MEDQ  D:  08/26/2012  T:  08/26/2012  Job:  [id number]

## 2012-08-26 NOTE — Anesthesia Preprocedure Evaluation (Signed)
Anesthesia Evaluation  Patient identified by MRN, date of birth, ID band Patient awake    Reviewed: Allergy & Precautions, H&P , NPO status , Patient's Chart, lab work & pertinent test results  Airway Mallampati: II      Dental  (+) Partial Lower and Edentulous Upper   Pulmonary shortness of breath and with exertion,  breath sounds clear to auscultation        Cardiovascular hypertension, Pt. on medications Rhythm:Regular Rate:Normal     Neuro/Psych    GI/Hepatic GERD-  Medicated and Controlled,  Endo/Other  diabetes, Well Controlled, Type 2, Oral Hypoglycemic Agents  Renal/GU      Musculoskeletal   Abdominal   Peds  Hematology   Anesthesia Other Findings   Reproductive/Obstetrics                           Anesthesia Physical Anesthesia Plan  ASA: III  Anesthesia Plan: MAC   Post-op Pain Management:    Induction: Intravenous  Airway Management Planned: Nasal Cannula  Additional Equipment:   Intra-op Plan:   Post-operative Plan:   Informed Consent: I have reviewed the patients History and Physical, chart, labs and discussed the procedure including the risks, benefits and alternatives for the proposed anesthesia with the patient or authorized representative who has indicated his/her understanding and acceptance.     Plan Discussed with:   Anesthesia Plan Comments:         Anesthesia Quick Evaluation  

## 2012-08-30 ENCOUNTER — Encounter (HOSPITAL_COMMUNITY): Payer: Self-pay | Admitting: Ophthalmology

## 2012-09-02 ENCOUNTER — Other Ambulatory Visit (HOSPITAL_COMMUNITY): Payer: Self-pay | Admitting: *Deleted

## 2012-09-02 DIAGNOSIS — C189 Malignant neoplasm of colon, unspecified: Secondary | ICD-10-CM

## 2012-09-03 ENCOUNTER — Encounter (HOSPITAL_BASED_OUTPATIENT_CLINIC_OR_DEPARTMENT_OTHER): Payer: Medicaid Other

## 2012-09-03 DIAGNOSIS — C189 Malignant neoplasm of colon, unspecified: Secondary | ICD-10-CM

## 2012-09-03 DIAGNOSIS — C787 Secondary malignant neoplasm of liver and intrahepatic bile duct: Secondary | ICD-10-CM

## 2012-09-03 DIAGNOSIS — Z452 Encounter for adjustment and management of vascular access device: Secondary | ICD-10-CM

## 2012-09-03 LAB — CBC WITH DIFFERENTIAL/PLATELET
Eosinophils Absolute: 0.1 10*3/uL (ref 0.0–0.7)
Eosinophils Relative: 2 % (ref 0–5)
HCT: 36.2 % (ref 36.0–46.0)
Hemoglobin: 11.7 g/dL — ABNORMAL LOW (ref 12.0–15.0)
Lymphs Abs: 1.8 10*3/uL (ref 0.7–4.0)
MCH: 29.8 pg (ref 26.0–34.0)
MCHC: 32.3 g/dL (ref 30.0–36.0)
MCV: 92.1 fL (ref 78.0–100.0)
Monocytes Absolute: 0.3 10*3/uL (ref 0.1–1.0)
Monocytes Relative: 7 % (ref 3–12)
Neutrophils Relative %: 55 % (ref 43–77)
RBC: 3.93 MIL/uL (ref 3.87–5.11)

## 2012-09-03 LAB — COMPREHENSIVE METABOLIC PANEL
Alkaline Phosphatase: 63 U/L (ref 39–117)
BUN: 13 mg/dL (ref 6–23)
Creatinine, Ser: 0.73 mg/dL (ref 0.50–1.10)
GFR calc Af Amer: >90 mL/min (ref >90)
Glucose, Bld: 117 mg/dL — ABNORMAL HIGH (ref 70–99)
Potassium: 4.1 mEq/L (ref 3.5–5.1)
Total Protein: 7.1 g/dL (ref 6.0–8.3)

## 2012-09-03 MED ORDER — SODIUM CHLORIDE 0.9 % IJ SOLN
10.0000 mL | INTRAMUSCULAR | Status: DC | PRN
  Administered 2012-09-03: 10 mL via INTRAVENOUS
  Filled 2012-09-03: qty 10

## 2012-09-03 MED ORDER — HEPARIN SOD (PORK) LOCK FLUSH 100 UNIT/ML IV SOLN
500.0000 [IU] | Freq: Once | INTRAVENOUS | Status: AC
  Administered 2012-09-03: 500 [IU] via INTRAVENOUS
  Filled 2012-09-03: qty 5

## 2012-09-03 NOTE — Progress Notes (Signed)
Ciana J Lords presented for Portacath access and flush. Proper placement of portacath confirmed by CXR. Portacath located rt chest wall accessed with  H 20 needle. Good blood return present. Portacath flushed with 20ml NS and 500U/5ml Heparin and needle removed intact. Procedure without incident. Patient tolerated procedure well.   

## 2012-09-09 ENCOUNTER — Ambulatory Visit (HOSPITAL_COMMUNITY)
Admission: RE | Admit: 2012-09-09 | Discharge: 2012-09-09 | Disposition: A | Discharge status: Home / Self Care | Payer: Medicaid Other | Source: Ambulatory Visit | Attending: Oncology | Admitting: Oncology

## 2012-09-09 ENCOUNTER — Encounter (HOSPITAL_COMMUNITY): Payer: PRIVATE HEALTH INSURANCE

## 2012-09-09 ENCOUNTER — Other Ambulatory Visit (HOSPITAL_COMMUNITY): Payer: Medicaid Other

## 2012-09-09 DIAGNOSIS — R935 Abnormal findings on diagnostic imaging of other abdominal regions, including retroperitoneum: Secondary | ICD-10-CM | POA: Insufficient documentation

## 2012-09-09 DIAGNOSIS — C189 Malignant neoplasm of colon, unspecified: Secondary | ICD-10-CM | POA: Insufficient documentation

## 2012-09-09 DIAGNOSIS — C787 Secondary malignant neoplasm of liver and intrahepatic bile duct: Secondary | ICD-10-CM | POA: Insufficient documentation

## 2012-09-09 MED ORDER — IOHEXOL 300 MG/ML  SOLN
100.0000 mL | Freq: Once | INTRAMUSCULAR | Status: AC | PRN
  Administered 2012-09-09: 100 mL via INTRAVENOUS

## 2012-09-11 ENCOUNTER — Encounter (HOSPITAL_COMMUNITY): Payer: Medicaid Other | Attending: Oncology | Admitting: Oncology

## 2012-09-11 VITALS — BP 136/85 | HR 122 | Resp 18 | Wt 188.0 lb

## 2012-09-11 DIAGNOSIS — R110 Nausea: Secondary | ICD-10-CM | POA: Insufficient documentation

## 2012-09-11 DIAGNOSIS — G609 Hereditary and idiopathic neuropathy, unspecified: Secondary | ICD-10-CM

## 2012-09-11 DIAGNOSIS — C7960 Secondary malignant neoplasm of unspecified ovary: Secondary | ICD-10-CM

## 2012-09-11 DIAGNOSIS — E119 Type 2 diabetes mellitus without complications: Secondary | ICD-10-CM

## 2012-09-11 DIAGNOSIS — C189 Malignant neoplasm of colon, unspecified: Secondary | ICD-10-CM | POA: Insufficient documentation

## 2012-09-11 NOTE — Patient Instructions (Addendum)
[facility] Discharge Instructions  RECOMMENDATIONS MADE BY THE CONSULTANT AND ANY TEST RESULTS WILL BE SENT TO YOUR REFERRING PHYSICIAN.  EXAM FINDINGS BY THE PHYSICIAN TODAY AND SIGNS OR SYMPTOMS TO REPORT TO CLINIC OR PRIMARY PHYSICIAN: Exam and discussion by PA.  Your scans show that you have disease progression and we need to get you restarted on chemotherapy.  Will need to get a pump for you and get your medications that you need called into your pharmacy.  MEDICATIONS PRESCRIBED: None today.    SPECIAL INSTRUCTIONS/FOLLOW-UP: Chemotherapy to restart on 09/25/12 and will have you come back for follow-up in 1 month.  Thank you for choosing [facility] to provide your oncology and hematology care.  To afford each patient quality time with our providers, please arrive at least 15 minutes before your scheduled appointment time.  With your help, our goal is to use those 15 minutes to complete the necessary work-up to ensure our physicians have the information they need to help with your evaluation and healthcare recommendations.    Effective January 1st, 2014, we ask that you re-schedule your appointment with our physicians should you arrive 10 or more minutes late for your appointment.  We strive to give you quality time with our providers, and arriving late affects you and other patients whose appointments are after yours.    Again, thank you for choosing [facility].  Our hope is that these requests will decrease the amount of time that you wait before being seen by our physicians.       _____________________________________________________________  Should you have questions after your visit to [facility], please contact our office at [phone] between the hours of 8:30 a.m. and 5:00 p.m.  Voicemails left after 4:30 p.m. will not be returned until the following business day.  For prescription refill requests, have your  pharmacy contact our office with your prescription refill request.

## 2012-09-11 NOTE — Progress Notes (Signed)
Yolanda Kelch, NP No address on file  1. Metastatic recurrent adenocarcinoma of colon     CURRENT THERAPY: Observation  INTERVAL HISTORY: Yolanda Friedman 62 y.o. 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  S/P 12 treatments of FOLFIRI + Avastin 09/20/2011- 02/21/2012.  Prior to this, she received 12 treatments of FOLFOX + Avastin finishing on 06/27/2011.  Following her last treatment, Yolanda Friedman's quality of life required some improvement secondary to chemotherapy.  She was given a Holiday in therapy.  During this time she traveled and spent time with family.    She is here today to review recent labs and CT scans. I personally reviewed and went over laboratory results with the patient.  Her CEA has been rising and is most recently 10.    I personally reviewed and went over radiographic studies with the patient.  CT scans has illustrated recurrence of disease.   I spent a significant amount of time discussing these findings.    We spent time discussing the role of chemotherapy in her situation.  We will re-challenge her with the same therapy since she did not fail in the past.  So I have built FOLFIRI + Avastin chemotherapy.  We will plan on starting in 2 weeks. All questions were answered.  Risks, benefits, alternatives, and side effects of therapy were discussed.   She admits to some abdominal cramping.  This may be from her cancer, but her K+ is WNL and other culprits were not identified in her lab work.  I have recommended a few anecdotal treatments.  Otherwise, complete ROS questioning is negative.  She handled the news well, but she was clearly deflated with the new findings and knowing that she will require further therapy.  We did very briefly discuss comfort care only, which she is not interested in at this time.   Past Medical History    Diagnosis Date  . Hypertension   . Cancer   . Colon cancer 2005    metastatic 2012  . 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 Diabetes mellitus   . HTN (hypertension) 12/22/2011  . Shortness of breath   . GERD (gastroesophageal reflux disease)   . Anxiety     has Metastatic recurrent adenocarcinoma of colon; HTN (hypertension); and Nausea on her problem list.      has no known allergies.  Yolanda Friedman does not currently have medications on file.  Past Surgical History  Procedure Date  . Cholecystectomy 1987  . Colectomy 2005    colon ca  . Abdominal hysterectomy 12/20/2010    met colon ca  . Portacath placement 01/2011  . Cataract extraction w/phaco 08/08/2012    Procedure: CATARACT EXTRACTION PHACO AND INTRAOCULAR LENS PLACEMENT (IOC);  Surgeon: Yolanda Payor, MD;  Location: [facility];  Service: Ophthalmology;  Laterality: Right;  CDE:16.18  . Cataract extraction w/phaco 08/26/2012    Procedure: CATARACT EXTRACTION PHACO AND INTRAOCULAR LENS PLACEMENT (IOC);  Surgeon: Yolanda Payor, MD;  Location: [facility];  Service: Ophthalmology;  Laterality: Left;  CDE 7.64    Denies any headaches, dizziness, double vision, fevers, chills, night sweats, nausea, vomiting, diarrhea, constipation, chest pain, heart palpitations, shortness of breath, blood in stool, black tarry stool, urinary pain, urinary burning, urinary frequency, hematuria.   PHYSICAL EXAMINATION  ECOG PERFORMANCE STATUS: 1 - Symptomatic but completely ambulatory  Filed Vitals:  09/11/12 1234  BP: 136/85  Pulse: 122  Resp: 18    GENERAL:alert, no distress, well nourished, well developed, comfortable, cooperative, obese and smiling SKIN: skin color, texture, turgor are normal, no rashes or significant lesions HEAD: Normocephalic, No masses, lesions, tenderness or abnormalities EYES: normal, Conjunctiva are pink and non-injected EARS: External ears normal OROPHARYNX:mucous membranes are moist  NECK: supple,  trachea midline LYMPH:  not examined BREAST:not examined LUNGS: not examined HEART: not examined ABDOMEN:abdomen soft, non-tender, obese and normal bowel sounds BACK: Back symmetric, no curvature. EXTREMITIES:no skin discoloration, no cyanosis  NEURO: alert & oriented x 3 with fluent speech, no focal motor/sensory deficits, gait normal    LABORATORY DATA: CBC    Component Value Date/Time   WBC 4.9 09/03/2012 1119   RBC 3.93 09/03/2012 1119   HGB 11.7* 09/03/2012 1119   HCT 36.2 09/03/2012 1119   PLT 211 09/03/2012 1119   MCV 92.1 09/03/2012 1119   MCH 29.8 09/03/2012 1119   MCHC 32.3 09/03/2012 1119   RDW 14.4 09/03/2012 1119   LYMPHSABS 1.8 09/03/2012 1119   MONOABS 0.3 09/03/2012 1119   EOSABS 0.1 09/03/2012 1119   BASOSABS 0.0 09/03/2012 1119      Chemistry      Component Value Date/Time   NA 136 09/03/2012 1119   K 4.1 09/03/2012 1119   CL 100 09/03/2012 1119   CO2 28 09/03/2012 1119   BUN 13 09/03/2012 1119   CREATININE 0.73 09/03/2012 1119      Component Value Date/Time   CALCIUM 9.6 09/03/2012 1119   ALKPHOS 63 09/03/2012 1119   AST 24 09/03/2012 1119   ALT 15 09/03/2012 1119   BILITOT 0.2* 09/03/2012 1119     Lab Results  Component Value Date   CEA 10.0* 08/06/2012   Results for Yolanda Friedman, Yolanda Friedman (MRN [id number]) as of 09/11/2012 13:03  Ref. Range 11/15/2011 09:19 11/29/2011 09:50 01/23/2012 11:35 06/25/2012 11:22 08/06/2012 12:25  CEA Latest Range: 0.0-5.0 ng/mL 2.0 2.6 2.6 6.2 (H) 10.0 (H)     RADIOGRAPHIC STUDIES:  Ct Chest W Contrast  09/09/2012  *RADIOLOGY REPORT*  Clinical Data:  Follow-up recurrent colon carcinoma.  CT CHEST, ABDOMEN AND PELVIS WITH CONTRAST  Technique:  Multidetector CT imaging of the chest, abdomen and pelvis was performed following the standard protocol during bolus administration of intravenous contrast.  Contrast: OMNIPAQUE IOHEXOL 300 MG/ML  SOLN  Comparison:  03/06/2012  CT CHEST  Findings:  4 mm right lower lobe  pulmonary nodule on image 25 is stable.  A subtle 3 mm nodule in the right middle lobe on image 29 is also stable.  No new or enlarging pulmonary nodules or masses are identified.  No evidence of pulmonary infiltrate or central endobronchial lesion.  No evidence of pleural or pericardial effusion.  No evidence of hilar or mediastinal adenopathy.  No adenopathy seen elsewhere within the thorax.  No evidence of suspicious bone lesions or chest wall mass.  IMPRESSION: Stable tiny right lung nodules.  No definite metastatic disease or other acute findings identified.  CT ABDOMEN AND PELVIS  Findings:  Mild hepatic steatosis is stable, but no liver masses are identified.  Prior cholecystectomy again noted.  The pancreas, spleen, adrenal glands, and kidneys are normal appearance.  A partially calcified soft tissue mass in the right upper quadrant mesentery located just inferior to the pancreas is mildly increased in size, now measuring 3.2 x 4.7 cm compared to 2.6 x 4.0 cm previously.  A new low attenuation  soft tissue mass is seen in the superior right pelvis adjacent to the right common iliac vessels which measures 2.6 x 2.9 cm.  Increased size of another soft tissue mass is seen in the right pelvis adjacent to the iliopsoas muscle, which now measures 1.4 x 2.4 cm compared with 0.9 x 1.3 cm previously.  Prior hysterectomy again noted.  A soft tissue mass at the superior margin of the vaginal cuff shows no significant change in size, measuring 3.2 by 4.1 cm.  Soft tissue stranding in the sigmoid mesentery and along the left pelvic sidewall shows no significant change.  No evidence of inflammatory process or abscess.  No evidence of ascites.  No evidence of dilated bowel loops or hernia.  No suspicious bone lesions identified.  IMPRESSION:  1.  Increased size of several soft tissue masses in the right upper quadrant and pelvis, consistent with mild interval progression of metastatic disease. 2.  No liver metastases  identified.  Hepatic steatosis again noted.   Original Report Authenticated By: Myles Rosenthal, M.D.    Ct Abdomen Pelvis W Contrast  09/09/2012  *RADIOLOGY REPORT*  Clinical Data:  Follow-up recurrent colon carcinoma.  CT CHEST, ABDOMEN AND PELVIS WITH CONTRAST  Technique:  Multidetector CT imaging of the chest, abdomen and pelvis was performed following the standard protocol during bolus administration of intravenous contrast.  Contrast: OMNIPAQUE IOHEXOL 300 MG/ML  SOLN  Comparison:  03/06/2012  CT CHEST  Findings:  4 mm right lower lobe pulmonary nodule on image 25 is stable.  A subtle 3 mm nodule in the right middle lobe on image 29 is also stable.  No new or enlarging pulmonary nodules or masses are identified.  No evidence of pulmonary infiltrate or central endobronchial lesion.  No evidence of pleural or pericardial effusion.  No evidence of hilar or mediastinal adenopathy.  No adenopathy seen elsewhere within the thorax.  No evidence of suspicious bone lesions or chest wall mass.  IMPRESSION: Stable tiny right lung nodules.  No definite metastatic disease or other acute findings identified.  CT ABDOMEN AND PELVIS  Findings:  Mild hepatic steatosis is stable, but no liver masses are identified.  Prior cholecystectomy again noted.  The pancreas, spleen, adrenal glands, and kidneys are normal appearance.  A partially calcified soft tissue mass in the right upper quadrant mesentery located just inferior to the pancreas is mildly increased in size, now measuring 3.2 x 4.7 cm compared to 2.6 x 4.0 cm previously.  A new low attenuation soft tissue mass is seen in the superior right pelvis adjacent to the right common iliac vessels which measures 2.6 x 2.9 cm.  Increased size of another soft tissue mass is seen in the right pelvis adjacent to the iliopsoas muscle, which now measures 1.4 x 2.4 cm compared with 0.9 x 1.3 cm previously.  Prior hysterectomy again noted.  A soft tissue mass at the superior margin of  the vaginal cuff shows no significant change in size, measuring 3.2 by 4.1 cm.  Soft tissue stranding in the sigmoid mesentery and along the left pelvic sidewall shows no significant change.  No evidence of inflammatory process or abscess.  No evidence of ascites.  No evidence of dilated bowel loops or hernia.  No suspicious bone lesions identified.  IMPRESSION:  1.  Increased size of several soft tissue masses in the right upper quadrant and pelvis, consistent with mild interval progression of metastatic disease. 2.  No liver metastases identified.  Hepatic steatosis again noted.  Original Report Authenticated By: Myles Rosenthal, M.D.      PATHOLOGY: 12/06/2010  Diagnosis 1. Omentum, biopsy - INVOLVEMENT BY ADENOCARCINOMA WITH MUCINOUS FEATURES 2. Ovary and fallopian tube, left - OVARY WITH INVOLVEMENT BY ADENOCARCINOMA WITH MUCINOUS FEATURES - BENIGN FALLOPIAN TUBE TISSUE 3. Ovary and fallopian tube, right - OVARY WITH INVOLVEMENT BY ADENOCARCINOMA WITH MUCINOUS FEATURES - BENIGN FALLOPIAN TUBE TISSUE 4. Uterus and cervix - SEROSAL INVOLVEMENT BY METASTATIC ADENOCARCINOMA WITH MUCINOUS FEATURES - BENIGN PROLIFERATIVE ENDOMETRIUM ASSOCIATED WITH BENIGN ENDOMETRIAL POLYP - LEIOMYOMATA - FOCAL SEROSAL ENDOMETRIOSIS 5. Omentum, resection for tumor - INVOLVEMENT BY ADENOCARCINOMA WITH MUCINOUS FEATURES Microscopic Comment 1. -5. The omentum, bilateral ovaries and uterine serosa show involvement by moderately differenatiated adenocarcinoma that in many areas displays abundant extracellular mucin. Immunohistochemical stains performed on three representative blocks from omentum and both ovaries show that the tumor cells stain as follows: CDX-2 - positive Cytokeratin 20 - positive Cytokeratin 7 - negative Estrogen receptor - negative Progesterone receptor - negative (nonspecific cytoplasmic staining) WT-1 - negative In lieu of the previous history of right colonic mucinous adenocarcinoma, the  histologic appearance as well as the immunohistochemical profile, the overall findings are consistent with involvement by adenocarcinoma from colonic primary. Clinical correlation is strongly recommended. (BNS:kh 12-09-10) 1 of 3 FINAL for Yolanda Friedman, Yolanda Friedman ([id number]) Microscopic Comment(continued) Guerry Bruin MD Pathologist, Electronic Signature (Case signed 12/09/2010)     ASSESSMENT:  1. 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  2. Grade 1, possibly grade 2 peripheral neuropathy of the feet. We will try gabapentin 300 mg twice a day for one week and then increase it to 3 times a day and she will let us know how she's doing.  3. Diabetes mellitus, on metformin  4. Obesity now up to 188 pounds on a 5 foot 0 inch frame.   PLAN:  1. I personally reviewed and went over laboratory results with the patient. 2. I personally reviewed and went over radiographic studies with the patient. 3. Discussion regarding chemotherapy versus comfort care. 4. Patient wishes to pursue therapy, reluctantly. 5. FOLFIRI chemotherapy plan built 6. Avastin antibody plan built. 7. Pre-chemotherapy labs ordered as standing orders: CBC diff, CMET, CEA (every 4 weeks). 8. Plan for chemotherapy in 2 weeks.  9. Recommended anecdotal remedies for cramping. 10. Return in 4 weeks for follow-up.     All questions were answered. The patient knows to call the clinic with any problems, questions or concerns. We can certainly see the patient much sooner if necessary.  The patient and plan discussed with Glenford Peers, MD and he is in agreement with the aforementioned.  KEFALAS,THOMAS

## 2012-09-17 ENCOUNTER — Other Ambulatory Visit (HOSPITAL_COMMUNITY): Payer: Medicaid Other

## 2012-09-18 ENCOUNTER — Other Ambulatory Visit (HOSPITAL_COMMUNITY): Payer: Self-pay | Admitting: Oncology

## 2012-09-18 DIAGNOSIS — C189 Malignant neoplasm of colon, unspecified: Secondary | ICD-10-CM

## 2012-09-18 MED ORDER — LORAZEPAM 1 MG PO TABS: 1.0000 mg | ORAL_TABLET | ORAL | Status: DC | PRN

## 2012-09-24 ENCOUNTER — Telehealth (HOSPITAL_COMMUNITY): Payer: Self-pay | Admitting: *Deleted

## 2012-09-24 NOTE — Telephone Encounter (Signed)
Yolanda Friedman went to urgent care re: her leg wound. They put her on antibiotic. She has not picked it up yet so she is not  Sure what it is. Should she come tomorrow for chemo or should we delay?

## 2012-09-24 NOTE — Telephone Encounter (Signed)
Patient notified

## 2012-09-24 NOTE — Telephone Encounter (Signed)
She should come for chemo and start her antibiotic

## 2012-09-25 ENCOUNTER — Encounter (HOSPITAL_BASED_OUTPATIENT_CLINIC_OR_DEPARTMENT_OTHER): Payer: Medicaid Other

## 2012-09-25 VITALS — BP 132/79 | HR 76 | Temp 97.9°F | Resp 16

## 2012-09-25 DIAGNOSIS — R110 Nausea: Secondary | ICD-10-CM

## 2012-09-25 DIAGNOSIS — C7960 Secondary malignant neoplasm of unspecified ovary: Secondary | ICD-10-CM

## 2012-09-25 DIAGNOSIS — C189 Malignant neoplasm of colon, unspecified: Secondary | ICD-10-CM

## 2012-09-25 DIAGNOSIS — Z5112 Encounter for antineoplastic immunotherapy: Secondary | ICD-10-CM

## 2012-09-25 LAB — URINALYSIS, DIPSTICK ONLY
Ketones, ur: NEGATIVE mg/dL
Leukocytes, UA: NEGATIVE
Nitrite: NEGATIVE
Protein, ur: NEGATIVE mg/dL
pH: 6.5 (ref 5.0–8.0)

## 2012-09-25 LAB — CBC WITH DIFFERENTIAL/PLATELET
Basophils Relative: 0 % (ref 0–1)
Eosinophils Absolute: 0.1 10*3/uL (ref 0.0–0.7)
Eosinophils Relative: 2 % (ref 0–5)
MCH: 30.1 pg (ref 26.0–34.0)
MCHC: 32.5 g/dL (ref 30.0–36.0)
MCV: 92.5 fL (ref 78.0–100.0)
Neutrophils Relative %: 64 % (ref 43–77)
Platelets: 203 10*3/uL (ref 150–400)
RBC: 3.59 MIL/uL — ABNORMAL LOW (ref 3.87–5.11)
RDW: 14.4 % (ref 11.5–15.5)

## 2012-09-25 LAB — COMPREHENSIVE METABOLIC PANEL
ALT: 14 U/L (ref 0–35)
Albumin: 3.7 g/dL (ref 3.5–5.2)
Alkaline Phosphatase: 60 U/L (ref 39–117)
Calcium: 9.5 mg/dL (ref 8.4–10.5)
GFR calc Af Amer: 88 mL/min — ABNORMAL LOW (ref >90)
Potassium: 3.8 mEq/L (ref 3.5–5.1)
Sodium: 137 mEq/L (ref 135–145)
Total Protein: 6.9 g/dL (ref 6.0–8.3)

## 2012-09-25 LAB — CEA: CEA: 16 ng/mL — ABNORMAL HIGH (ref 0.0–5.0)

## 2012-09-25 MED ORDER — DEXAMETHASONE 4 MG PO TABS: 8.0000 mg | ORAL_TABLET | Freq: Two times a day (BID) | ORAL | Status: DC

## 2012-09-25 MED ORDER — FLUOROURACIL CHEMO INJECTION 2.5 GM/50ML
400.0000 mg/m2 | Freq: Once | INTRAVENOUS | Status: AC
  Administered 2012-09-25: 750 mg via INTRAVENOUS
  Filled 2012-09-25: qty 15

## 2012-09-25 MED ORDER — LEUCOVORIN CALCIUM INJECTION 100 MG
20.0000 mg/m2 | Freq: Once | INTRAMUSCULAR | Status: AC
  Administered 2012-09-25: 38 mg via INTRAVENOUS
  Filled 2012-09-25: qty 1.9

## 2012-09-25 MED ORDER — PROCHLORPERAZINE MALEATE 10 MG PO TABS: 10.0000 mg | ORAL_TABLET | Freq: Four times a day (QID) | ORAL | Status: DC | PRN

## 2012-09-25 MED ORDER — ONDANSETRON HCL 8 MG PO TABS: 8.0000 mg | ORAL_TABLET | Freq: Two times a day (BID) | ORAL | Status: DC

## 2012-09-25 MED ORDER — LORAZEPAM 2 MG/ML IJ SOLN
INTRAMUSCULAR | Status: AC
  Filled 2012-09-25: qty 1

## 2012-09-25 MED ORDER — SODIUM CHLORIDE 0.9 % IV SOLN
Freq: Once | INTRAVENOUS | Status: AC
  Administered 2012-09-25: 09:00:00 via INTRAVENOUS

## 2012-09-25 MED ORDER — LEUCOVORIN CALCIUM INJECTION 350 MG: 400.0000 mg/m2 | Freq: Once | INTRAVENOUS | Status: DC

## 2012-09-25 MED ORDER — SODIUM CHLORIDE 0.9 % IV SOLN
5.0000 mg/kg | Freq: Once | INTRAVENOUS | Status: AC
  Administered 2012-09-25: 425 mg via INTRAVENOUS
  Filled 2012-09-25: qty 17

## 2012-09-25 MED ORDER — SODIUM CHLORIDE 0.9 % IV SOLN: 16.0000 mg | Freq: Once | INTRAVENOUS | Status: DC

## 2012-09-25 MED ORDER — LORAZEPAM 0.5 MG PO TABS: 0.5000 mg | ORAL_TABLET | Freq: Four times a day (QID) | ORAL | Status: DC | PRN

## 2012-09-25 MED ORDER — PROCHLORPERAZINE 25 MG RE SUPP: 25.0000 mg | Freq: Two times a day (BID) | RECTAL | Status: DC | PRN

## 2012-09-25 MED ORDER — IRINOTECAN HCL CHEMO INJECTION 100 MG/5ML
180.0000 mg/m2 | Freq: Once | INTRAVENOUS | Status: AC
  Administered 2012-09-25: 342 mg via INTRAVENOUS
  Filled 2012-09-25: qty 17.1

## 2012-09-25 MED ORDER — DEXAMETHASONE SODIUM PHOSPHATE 10 MG/ML IJ SOLN: 20.0000 mg | Freq: Once | INTRAMUSCULAR | Status: DC

## 2012-09-25 MED ORDER — SODIUM CHLORIDE 0.9 % IV SOLN
2400.0000 mg/m2 | INTRAVENOUS | Status: DC
  Administered 2012-09-25: 4550 mg via INTRAVENOUS
  Filled 2012-09-25 (×2): qty 91

## 2012-09-25 MED ORDER — LORAZEPAM 2 MG/ML IJ SOLN
1.0000 mg | Freq: Once | INTRAMUSCULAR | Status: AC
  Administered 2012-09-25: 1 mg via INTRAVENOUS

## 2012-09-25 MED ORDER — SODIUM CHLORIDE 0.9 % IV SOLN
Freq: Once | INTRAVENOUS | Status: AC
  Administered 2012-09-25: 16 mg via INTRAVENOUS
  Filled 2012-09-25: qty 8

## 2012-09-25 MED ORDER — SODIUM CHLORIDE 0.9 % IV SOLN: Freq: Once | INTRAVENOUS | Status: DC

## 2012-09-25 NOTE — Progress Notes (Signed)
Ativan 1 mg given IV for c/o nausea. Expressed relief from nausea approx 1/2 hour after receiving..  Tolerated chemo well, all in all.. D/C to Home accompanied by family member.

## 2012-09-27 ENCOUNTER — Encounter (HOSPITAL_BASED_OUTPATIENT_CLINIC_OR_DEPARTMENT_OTHER): Payer: Medicaid Other

## 2012-09-27 ENCOUNTER — Other Ambulatory Visit (HOSPITAL_COMMUNITY): Payer: Self-pay | Admitting: Oncology

## 2012-09-27 DIAGNOSIS — C189 Malignant neoplasm of colon, unspecified: Secondary | ICD-10-CM

## 2012-09-27 DIAGNOSIS — Z452 Encounter for adjustment and management of vascular access device: Secondary | ICD-10-CM

## 2012-09-27 MED ORDER — HEPARIN SOD (PORK) LOCK FLUSH 100 UNIT/ML IV SOLN
500.0000 [IU] | Freq: Once | INTRAVENOUS | Status: AC | PRN
  Administered 2012-09-27: 500 [IU]
  Filled 2012-09-27: qty 5

## 2012-09-27 MED ORDER — HEPARIN SOD (PORK) LOCK FLUSH 100 UNIT/ML IV SOLN
INTRAVENOUS | Status: AC
  Filled 2012-09-27: qty 5

## 2012-09-27 MED ORDER — SODIUM CHLORIDE 0.9 % IJ SOLN
10.0000 mL | INTRAMUSCULAR | Status: DC | PRN
  Administered 2012-09-27: 10 mL
  Filled 2012-09-27: qty 10

## 2012-09-30 ENCOUNTER — Telehealth (HOSPITAL_COMMUNITY): Payer: Self-pay | Admitting: *Deleted

## 2012-09-30 NOTE — Telephone Encounter (Signed)
Patient states that she is not going to be able to take any more of her antibiotic (Bactrim) because it is making her sick. She has 4 days left to take on it. She says to her that the wound on her leg looks good. She says that with the antibiotic in her system along with the chemo is just too much. It was making her sick on her stomach. I asked patient if she would like for me to see if there was another antibiotic that she could tolerate and she said no.

## 2012-09-30 NOTE — Telephone Encounter (Signed)
noted 

## 2012-10-01 ENCOUNTER — Telehealth (HOSPITAL_COMMUNITY): Payer: Self-pay | Admitting: Oncology

## 2012-10-01 NOTE — Telephone Encounter (Signed)
Incoming call from [place], patient's daughter, and she requested a return phone call.  I attempted to reach [facility] at [phone] today w/o success, and I left a voice mail on the # provided for her to return my call.

## 2012-10-09 ENCOUNTER — Encounter (HOSPITAL_COMMUNITY): Payer: Medicare Other | Attending: Oncology

## 2012-10-09 ENCOUNTER — Other Ambulatory Visit (HOSPITAL_COMMUNITY): Payer: Self-pay | Admitting: Oncology

## 2012-10-09 DIAGNOSIS — R110 Nausea: Secondary | ICD-10-CM | POA: Insufficient documentation

## 2012-10-09 DIAGNOSIS — C7960 Secondary malignant neoplasm of unspecified ovary: Secondary | ICD-10-CM

## 2012-10-09 DIAGNOSIS — C189 Malignant neoplasm of colon, unspecified: Secondary | ICD-10-CM | POA: Insufficient documentation

## 2012-10-09 DIAGNOSIS — Z5111 Encounter for antineoplastic chemotherapy: Secondary | ICD-10-CM

## 2012-10-09 DIAGNOSIS — G4700 Insomnia, unspecified: Secondary | ICD-10-CM | POA: Insufficient documentation

## 2012-10-09 LAB — COMPREHENSIVE METABOLIC PANEL
ALT: 14 U/L (ref 0–35)
AST: 21 U/L (ref 0–37)
Albumin: 3.4 g/dL — ABNORMAL LOW (ref 3.5–5.2)
CO2: 28 mEq/L (ref 19–32)
Calcium: 9.2 mg/dL (ref 8.4–10.5)
Creatinine, Ser: 0.7 mg/dL (ref 0.50–1.10)
GFR calc non Af Amer: >90 mL/min (ref >90)
Sodium: 138 mEq/L (ref 135–145)

## 2012-10-09 LAB — URINALYSIS, DIPSTICK ONLY
Bilirubin Urine: NEGATIVE
Glucose, UA: NEGATIVE mg/dL
Hgb urine dipstick: NEGATIVE
Ketones, ur: NEGATIVE mg/dL
Leukocytes, UA: NEGATIVE
Protein, ur: NEGATIVE mg/dL
pH: 6 (ref 5.0–8.0)

## 2012-10-09 LAB — CBC WITH DIFFERENTIAL/PLATELET
Basophils Absolute: 0 10*3/uL (ref 0.0–0.1)
Basophils Relative: 0 % (ref 0–1)
Eosinophils Relative: 2 % (ref 0–5)
Lymphocytes Relative: 34 % (ref 12–46)
MCHC: 33.3 g/dL (ref 30.0–36.0)
MCV: 92 fL (ref 78.0–100.0)
Neutro Abs: 3.3 10*3/uL (ref 1.7–7.7)
Platelets: 187 10*3/uL (ref 150–400)
RDW: 14.3 % (ref 11.5–15.5)
WBC: 5.6 10*3/uL (ref 4.0–10.5)

## 2012-10-09 MED ORDER — SODIUM CHLORIDE 0.9 % IV SOLN
Freq: Once | INTRAVENOUS | Status: AC
  Administered 2012-10-09: 10:00:00 via INTRAVENOUS

## 2012-10-09 MED ORDER — SODIUM CHLORIDE 0.9 % IV SOLN: 16.0000 mg | Freq: Once | INTRAVENOUS | Status: DC

## 2012-10-09 MED ORDER — LEUCOVORIN CALCIUM INJECTION 100 MG
20.0000 mg/m2 | Freq: Once | INTRAMUSCULAR | Status: AC
  Administered 2012-10-09: 38 mg via INTRAVENOUS
  Filled 2012-10-09: qty 1.9

## 2012-10-09 MED ORDER — FLUOROURACIL CHEMO INJECTION 2.5 GM/50ML
400.0000 mg/m2 | Freq: Once | INTRAVENOUS | Status: AC
  Administered 2012-10-09: 750 mg via INTRAVENOUS
  Filled 2012-10-09: qty 15

## 2012-10-09 MED ORDER — SODIUM CHLORIDE 0.9 % IV SOLN
Freq: Once | INTRAVENOUS | Status: AC
  Administered 2012-10-09: 16 mg via INTRAVENOUS
  Filled 2012-10-09: qty 8

## 2012-10-09 MED ORDER — LORAZEPAM 2 MG/ML IJ SOLN
INTRAMUSCULAR | Status: AC
  Filled 2012-10-09: qty 1

## 2012-10-09 MED ORDER — SODIUM CHLORIDE 0.9 % IV SOLN
5.0000 mg/kg | Freq: Once | INTRAVENOUS | Status: AC
  Administered 2012-10-09: 425 mg via INTRAVENOUS
  Filled 2012-10-09: qty 17

## 2012-10-09 MED ORDER — IRINOTECAN HCL CHEMO INJECTION 100 MG/5ML
180.0000 mg/m2 | Freq: Once | INTRAVENOUS | Status: AC
  Administered 2012-10-09: 342 mg via INTRAVENOUS
  Filled 2012-10-09: qty 17.1

## 2012-10-09 MED ORDER — DEXAMETHASONE SODIUM PHOSPHATE 10 MG/ML IJ SOLN: 20.0000 mg | Freq: Once | INTRAMUSCULAR | Status: DC

## 2012-10-09 MED ORDER — SODIUM CHLORIDE 0.9 % IV SOLN
2400.0000 mg/m2 | INTRAVENOUS | Status: DC
  Administered 2012-10-09: 4550 mg via INTRAVENOUS
  Filled 2012-10-09 (×2): qty 91

## 2012-10-09 MED ORDER — LEUCOVORIN CALCIUM INJECTION 350 MG: 400.0000 mg/m2 | Freq: Once | INTRAVENOUS | Status: DC

## 2012-10-09 MED ORDER — LORAZEPAM 2 MG/ML IJ SOLN
0.5000 mg | Freq: Once | INTRAMUSCULAR | Status: AC
  Administered 2012-10-09: 0.5 mg via INTRAVENOUS

## 2012-10-10 NOTE — Progress Notes (Signed)
Appt Cancelled  This encounter was created in error - please disregard.

## 2012-10-11 ENCOUNTER — Ambulatory Visit (HOSPITAL_COMMUNITY): Payer: Medicaid Other | Admitting: Oncology

## 2012-10-11 ENCOUNTER — Encounter (HOSPITAL_BASED_OUTPATIENT_CLINIC_OR_DEPARTMENT_OTHER): Payer: Medicare Other

## 2012-10-11 DIAGNOSIS — C189 Malignant neoplasm of colon, unspecified: Secondary | ICD-10-CM

## 2012-10-11 DIAGNOSIS — Z452 Encounter for adjustment and management of vascular access device: Secondary | ICD-10-CM

## 2012-10-11 MED ORDER — SODIUM CHLORIDE 0.9 % IJ SOLN
10.0000 mL | INTRAMUSCULAR | Status: DC | PRN
  Administered 2012-10-11: 10 mL
  Filled 2012-10-11: qty 10

## 2012-10-11 MED ORDER — HEPARIN SOD (PORK) LOCK FLUSH 100 UNIT/ML IV SOLN
500.0000 [IU] | Freq: Once | INTRAVENOUS | Status: AC | PRN
  Administered 2012-10-11: 500 [IU]
  Filled 2012-10-11: qty 5

## 2012-10-23 ENCOUNTER — Encounter (HOSPITAL_BASED_OUTPATIENT_CLINIC_OR_DEPARTMENT_OTHER): Payer: Medicare Other | Admitting: Oncology

## 2012-10-23 ENCOUNTER — Encounter (HOSPITAL_BASED_OUTPATIENT_CLINIC_OR_DEPARTMENT_OTHER): Payer: Medicare Other

## 2012-10-23 VITALS — BP 141/82 | HR 103 | Temp 97.7°F | Resp 20 | Wt 195.6 lb

## 2012-10-23 DIAGNOSIS — C189 Malignant neoplasm of colon, unspecified: Secondary | ICD-10-CM

## 2012-10-23 DIAGNOSIS — C7960 Secondary malignant neoplasm of unspecified ovary: Secondary | ICD-10-CM

## 2012-10-23 DIAGNOSIS — G609 Hereditary and idiopathic neuropathy, unspecified: Secondary | ICD-10-CM

## 2012-10-23 DIAGNOSIS — G4700 Insomnia, unspecified: Secondary | ICD-10-CM

## 2012-10-23 DIAGNOSIS — Z5112 Encounter for antineoplastic immunotherapy: Secondary | ICD-10-CM

## 2012-10-23 DIAGNOSIS — E119 Type 2 diabetes mellitus without complications: Secondary | ICD-10-CM

## 2012-10-23 LAB — URINALYSIS, DIPSTICK ONLY
Ketones, ur: NEGATIVE mg/dL
Leukocytes, UA: NEGATIVE
Nitrite: NEGATIVE
Protein, ur: NEGATIVE mg/dL

## 2012-10-23 LAB — COMPREHENSIVE METABOLIC PANEL
ALT: 16 U/L (ref 0–35)
Alkaline Phosphatase: 64 U/L (ref 39–117)
BUN: 16 mg/dL (ref 6–23)
CO2: 27 mEq/L (ref 19–32)
Calcium: 9.5 mg/dL (ref 8.4–10.5)
GFR calc Af Amer: >90 mL/min (ref >90)
GFR calc non Af Amer: 89 mL/min — ABNORMAL LOW (ref >90)
Glucose, Bld: 148 mg/dL — ABNORMAL HIGH (ref 70–99)
Potassium: 4 mEq/L (ref 3.5–5.1)
Sodium: 139 mEq/L (ref 135–145)

## 2012-10-23 LAB — CBC WITH DIFFERENTIAL/PLATELET
Eosinophils Relative: 1 % (ref 0–5)
Hemoglobin: 10.7 g/dL — ABNORMAL LOW (ref 12.0–15.0)
Lymphocytes Relative: 39 % (ref 12–46)
Lymphs Abs: 1.5 10*3/uL (ref 0.7–4.0)
MCV: 93.2 fL (ref 78.0–100.0)
Monocytes Relative: 8 % (ref 3–12)
Platelets: 185 10*3/uL (ref 150–400)
RBC: 3.53 MIL/uL — ABNORMAL LOW (ref 3.87–5.11)
WBC: 3.8 10*3/uL — ABNORMAL LOW (ref 4.0–10.5)

## 2012-10-23 MED ORDER — TEMAZEPAM 15 MG PO CAPS: 15.0000 mg | ORAL_CAPSULE | Freq: Every evening | ORAL | Status: DC | PRN

## 2012-10-23 MED ORDER — SODIUM CHLORIDE 0.9 % IV SOLN
2400.0000 mg/m2 | INTRAVENOUS | Status: DC
  Administered 2012-10-23: 4550 mg via INTRAVENOUS
  Filled 2012-10-23 (×2): qty 91

## 2012-10-23 MED ORDER — DEXAMETHASONE SODIUM PHOSPHATE 10 MG/ML IJ SOLN: 20.0000 mg | Freq: Once | INTRAMUSCULAR | Status: DC

## 2012-10-23 MED ORDER — LEUCOVORIN CALCIUM INJECTION 100 MG
20.0000 mg/m2 | Freq: Once | INTRAMUSCULAR | Status: AC
  Administered 2012-10-23: 38 mg via INTRAVENOUS
  Filled 2012-10-23: qty 1.9

## 2012-10-23 MED ORDER — LORAZEPAM 2 MG/ML IJ SOLN
0.5000 mg | Freq: Once | INTRAMUSCULAR | Status: AC
  Administered 2012-10-23: 0.5 mg via INTRAVENOUS

## 2012-10-23 MED ORDER — LEUCOVORIN CALCIUM INJECTION 350 MG: 400.0000 mg/m2 | Freq: Once | INTRAVENOUS | Status: DC

## 2012-10-23 MED ORDER — LORAZEPAM 2 MG/ML IJ SOLN
INTRAMUSCULAR | Status: AC
  Filled 2012-10-23: qty 1

## 2012-10-23 MED ORDER — SODIUM CHLORIDE 0.9 % IV SOLN
5.0000 mg/kg | Freq: Once | INTRAVENOUS | Status: AC
  Administered 2012-10-23: 425 mg via INTRAVENOUS
  Filled 2012-10-23: qty 17

## 2012-10-23 MED ORDER — SODIUM CHLORIDE 0.9 % IV SOLN: 16.0000 mg | Freq: Once | INTRAVENOUS | Status: DC

## 2012-10-23 MED ORDER — SODIUM CHLORIDE 0.9 % IV SOLN
Freq: Once | INTRAVENOUS | Status: AC
  Administered 2012-10-23: 10:00:00 via INTRAVENOUS

## 2012-10-23 MED ORDER — GELX MT GEL: 15.0000 mL | Freq: Three times a day (TID) | OROMUCOSAL | Status: DC | PRN

## 2012-10-23 MED ORDER — FLUOROURACIL CHEMO INJECTION 2.5 GM/50ML
400.0000 mg/m2 | Freq: Once | INTRAVENOUS | Status: AC
  Administered 2012-10-23: 750 mg via INTRAVENOUS
  Filled 2012-10-23: qty 15

## 2012-10-23 MED ORDER — SODIUM CHLORIDE 0.9 % IV SOLN
Freq: Once | INTRAVENOUS | Status: AC
  Administered 2012-10-23: 16 mg via INTRAVENOUS
  Filled 2012-10-23: qty 8

## 2012-10-23 MED ORDER — IRINOTECAN HCL CHEMO INJECTION 100 MG/5ML
180.0000 mg/m2 | Freq: Once | INTRAVENOUS | Status: AC
  Administered 2012-10-23: 342 mg via INTRAVENOUS
  Filled 2012-10-23: qty 17.1

## 2012-10-23 NOTE — Progress Notes (Signed)
[name], NP No address on file  Metastatic recurrent adenocarcinoma of colon  CURRENT THERAPY:S/P 2 treatments of FOLFIRI + Avastin starting on 09/25/2012  INTERVAL HISTORY: Yolanda Friedman 62 y.o. 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 S/P 12 treatments of FOLFIRI + Avastin 09/20/2011- 02/21/2012. Prior to this, she received 12 treatments of FOLFOX + Avastin finishing on 06/27/2011.  Yolanda Friedman reports that she does have some nausea but this is well controlled with Ativan.  She denies any episodes of vomiting.    She also reports occasional abdominal pain.  This is well controlled with pain medication.  She reports difficulty sleeping.  She has tried Ambien in the past.  She is agreeable to trying a medication called Restoril.  She is to take 15 mg PO at HS and if this is ineffective, she may take 30 mg at HS to help her sleep.   She also notes mouth discomfort on her palate and buccal mucosa.  She denies any lesions of white areas.  She reports that the anti-fungal mouthwash is not effective for her.  As a result, I gave her a sample of GelX for the pain.  There is no thrush noted today on examination.  Directions were provided to the patient.   I personally reviewed and went over laboratory results with the patient.  Her CEA was 16 at the start of therapy and this was repeated today.  It is pending and we will get that result tomorrow.  Her CBC is adequate for treatment and CMET is unremarkable.   She reports that she continues to have left shin discomfort from a wound that is healing.  It is healing nicely and is not infected.  She is a diabetic and this may take sometime to heal completely.   Oncologically, she otherwise denies any complaints and ROS questioning is negative.   Yolanda Friedman has a trip which she wishes to go on on 3/19.  She will  be gone for 1 week.  We will therefore alter her chemotherapy plan to accommodate.     Past Medical History  Diagnosis Date  . Hypertension   . Cancer   . Colon cancer 2005    metastatic 2012  . 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 Diabetes mellitus   . HTN (hypertension) 12/22/2011  . Shortness of breath   . GERD (gastroesophageal reflux disease)   . Anxiety     has Metastatic recurrent adenocarcinoma of colon; HTN (hypertension); and Nausea on her problem list.     has No Known Allergies.  Yolanda Friedman does not currently have medications on file.  Past Surgical History  Procedure Laterality Date  . Cholecystectomy  1987  . Colectomy  2005    colon ca  . Abdominal hysterectomy  12/20/2010    met colon ca  . Portacath placement  01/2011  . Cataract extraction w/phaco  08/08/2012    Procedure: CATARACT EXTRACTION PHACO AND INTRAOCULAR LENS PLACEMENT (IOC);  Surgeon: Gemma Payor, MD;  Location: [facility];  Service: Ophthalmology;  Laterality: Right;  CDE:16.18  . Cataract extraction w/phaco  08/26/2012    Procedure: CATARACT EXTRACTION PHACO AND INTRAOCULAR LENS PLACEMENT (IOC);  Surgeon: Gemma Payor, MD;  Location: [facility];  Service: Ophthalmology;  Laterality: Left;  CDE 7.64    Denies any headaches, dizziness, double vision, fevers, chills, night sweats,  nausea, vomiting, diarrhea, constipation, chest pain, heart palpitations, shortness of breath, blood in stool, black tarry stool, urinary pain, urinary burning, urinary frequency, hematuria.   PHYSICAL EXAMINATION  ECOG PERFORMANCE STATUS: 1 - Symptomatic but completely ambulatory  There were no vitals filed for this visit.  GENERAL:alert, no distress, well nourished, well developed, comfortable, cooperative, obese and smiling SKIN: skin color, texture, turgor are normal, no rashes or significant lesions HEAD: Normocephalic, No masses, lesions, tenderness or abnormalities EYES: normal, Conjunctiva are pink and  non-injected EARS: External ears normal OROPHARYNX:mucous membranes are moist, no indication of thrush orally.  NECK: supple, non-tender, trachea midline LYMPH:  not examined BREAST:not examined LUNGS: clear to auscultation  HEART: regular rate & rhythm, no murmurs, no gallops, S1 normal and S2 normal ABDOMEN:abdomen soft, non-tender, obese and normal bowel sounds BACK: Back symmetric, no curvature., No CVA tenderness EXTREMITIES:less then 2 second capillary refill, no joint deformities, effusion, or inflammation, no edema, no skin discoloration, no clubbing, no cyanosis, positive findings:  Left pre-tibial lesion, healing, no erythema or discharge.   NEURO: alert & oriented x 3 with fluent speech, no focal motor/sensory deficits, gait normal    LABORATORY DATA: CBC    Component Value Date/Time   WBC 3.8* 10/23/2012 0916   RBC 3.53* 10/23/2012 0916   HGB 10.7* 10/23/2012 0916   HCT 32.9* 10/23/2012 0916   PLT 185 10/23/2012 0916   MCV 93.2 10/23/2012 0916   MCH 30.3 10/23/2012 0916   MCHC 32.5 10/23/2012 0916   RDW 15.3 10/23/2012 0916   LYMPHSABS 1.5 10/23/2012 0916   MONOABS 0.3 10/23/2012 0916   EOSABS 0.1 10/23/2012 0916   BASOSABS 0.0 10/23/2012 0916      Chemistry      Component Value Date/Time   NA 139 10/23/2012 0916   K 4.0 10/23/2012 0916   CL 104 10/23/2012 0916   CO2 27 10/23/2012 0916   BUN 16 10/23/2012 0916   CREATININE 0.74 10/23/2012 0916      Component Value Date/Time   CALCIUM 9.5 10/23/2012 0916   ALKPHOS 64 10/23/2012 0916   AST 20 10/23/2012 0916   ALT 16 10/23/2012 0916   BILITOT 0.3 10/23/2012 0916     Lab Results  Component Value Date   CEA 16.0* 09/25/2012      PATHOLOGY: 12/06/2010  Diagnosis  1. 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 Ovary and fallopian tube, left  - OVARY WITH INVOLVEMENT BY ADENOCARCINOMA WITH MUCINOUS FEATURES  - BENIGN FALLOPIAN TUBE TISSUE  3. Ovary and fallopian tube, right  - OVARY WITH  INVOLVEMENT BY ADENOCARCINOMA WITH MUCINOUS FEATURES  - BENIGN FALLOPIAN TUBE TISSUE  4. Uterus and cervix  - SEROSAL INVOLVEMENT BY METASTATIC ADENOCARCINOMA WITH MUCINOUS  FEATURES  - BENIGN PROLIFERATIVE ENDOMETRIUM ASSOCIATED WITH BENIGN ENDOMETRIAL  POLYP  - LEIOMYOMATA  - FOCAL SEROSAL ENDOMETRIOSIS  5. Omentum, resection for tumor  - INVOLVEMENT BY ADENOCARCINOMA WITH MUCINOUS FEATURES  Microscopic Comment  1. -5. The omentum, bilateral ovaries and uterine serosa show involvement by moderately differenatiated  adenocarcinoma that in many areas displays abundant extracellular mucin. Immunohistochemical stains  performed on three representative blocks from omentum and both ovaries show that the tumor cells stain  as follows:  CDX-2 - positive  Cytokeratin 20 - positive  Cytokeratin 7 - negative  Estrogen receptor - negative  Progesterone receptor - negative (nonspecific cytoplasmic staining)  WT-1 - negative  In lieu of the previous history of right colonic mucinous adenocarcinoma,  the histologic appearance as well  as the immunohistochemical profile, the overall findings are consistent with involvement by adenocarcinoma  from colonic primary. Clinical correlation is strongly recommended. (BNS:kh 12-09-10)  1 of 3  FINAL for CHERIL, SLATTERY ([id number])  Microscopic Comment(continued)  Guerry Bruin MD  Pathologist, Electronic Signature  (Case signed 12/09/2010)     ASSESSMENT:  1. 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 S/P 12 treatments of FOLFIRI + Avastin 09/20/2011- 02/21/2012. Prior to this, she received 12 treatments of FOLFOX + Avastin finishing on 06/27/2011. Now back on therapy for progression with radiographic and CEA evidence for progression. 2. Grade 1, possibly grade 2 peripheral neuropathy of the feet. We will try gabapentin 300 mg twice a day for one week and then  increase it to 3 times a day and she will let us know how she's doing.  3. Diabetes mellitus, on metformin  4. Obesity now up to 195 pounds on a 5 foot 0 inch frame.   PLAN:  1. I personally reviewed and went over laboratory results with the patient. 2. Sample of GelX provided.  Added to medication list.  3. Rx for Restoril 15-30 mg at HS 4. Ct of CAP with contrast after 6 treatments of chemotherapy.  5. Chemotherapy plan altered to accommodate a trip in March.  6. Return in 4 weeks for follow-up.    All questions were answered. The patient knows to call the clinic with any problems, questions or concerns. We can certainly see the patient much sooner if necessary.  The patient and plan discussed with Glenford Peers, MD and he is in agreement with the aforementioned.   Yolanda Friedman

## 2012-10-23 NOTE — Patient Instructions (Addendum)
.  [facility] Discharge Instructions  RECOMMENDATIONS MADE BY THE CONSULTANT AND ANY TEST RESULTS WILL BE SENT TO YOUR REFERRING PHYSICIAN.  EXAM FINDINGS BY THE PHYSICIAN TODAY AND SIGNS OR SYMPTOMS TO REPORT TO CLINIC OR PRIMARY PHYSICIAN: Exam today per T. Kefalas PA  MEDICATIONS PRESCRIBED:  GelX samples given, this is  for your mouth sores Restoril 15-30 mg at bedtime--prescription given per Tom  INSTRUCTIONS GIVEN AND DISCUSSED: Report increased diarrhea, pain, or nausea.  SPECIAL INSTRUCTIONS/FOLLOW-UP: 1 month to see Tom To see Dr. Mariel Sleet after CT scan  Thank you for choosing [facility] to provide your oncology and hematology care.  To afford each patient quality time with our providers, please arrive at least 15 minutes before your scheduled appointment time.  With your help, our goal is to use those 15 minutes to complete the necessary work-up to ensure our physicians have the information they need to help with your evaluation and healthcare recommendations.    Effective January 1st, 2014, we ask that you re-schedule your appointment with our physicians should you arrive 10 or more minutes late for your appointment.  We strive to give you quality time with our providers, and arriving late affects you and other patients whose appointments are after yours.    Again, thank you for choosing [facility].  Our hope is that these requests will decrease the amount of time that you wait before being seen by our physicians.       _____________________________________________________________  Should you have questions after your visit to [facility], please contact our office at [phone] between the hours of 8:30 a.m. and 5:00 p.m.  Voicemails left after 4:30 p.m. will not be returned until the following business day.  For prescription refill requests, have your pharmacy contact our office with your prescription refill  request.

## 2012-10-25 ENCOUNTER — Encounter (HOSPITAL_BASED_OUTPATIENT_CLINIC_OR_DEPARTMENT_OTHER): Payer: Medicare Other

## 2012-10-25 DIAGNOSIS — C189 Malignant neoplasm of colon, unspecified: Secondary | ICD-10-CM

## 2012-10-25 DIAGNOSIS — Z452 Encounter for adjustment and management of vascular access device: Secondary | ICD-10-CM

## 2012-10-25 MED ORDER — SODIUM CHLORIDE 0.9 % IJ SOLN
10.0000 mL | INTRAMUSCULAR | Status: DC | PRN
  Administered 2012-10-25: 10 mL
  Filled 2012-10-25: qty 10

## 2012-10-25 MED ORDER — HEPARIN SOD (PORK) LOCK FLUSH 100 UNIT/ML IV SOLN
INTRAVENOUS | Status: AC
  Filled 2012-10-25: qty 5

## 2012-10-25 MED ORDER — HEPARIN SOD (PORK) LOCK FLUSH 100 UNIT/ML IV SOLN
500.0000 [IU] | Freq: Once | INTRAVENOUS | Status: AC | PRN
  Administered 2012-10-25: 500 [IU]
  Filled 2012-10-25: qty 5

## 2012-10-25 NOTE — Progress Notes (Signed)
D/C continuous infusion pump. Flushed port per protocol. Pt complains of lots of diarrhea since chemo on Wed. Called in to [facility] Lomotil, 2 tablets after first loose stool, 1 tablet as needed after each next loose stool, no more than 8 tablets daily. #40/2 refills.

## 2012-10-28 ENCOUNTER — Other Ambulatory Visit (HOSPITAL_COMMUNITY): Payer: Self-pay | Admitting: Oncology

## 2012-10-28 DIAGNOSIS — C189 Malignant neoplasm of colon, unspecified: Secondary | ICD-10-CM

## 2012-10-28 MED ORDER — POTASSIUM CHLORIDE ER 10 MEQ PO TBCR: 20.0000 meq | EXTENDED_RELEASE_TABLET | Freq: Every day | ORAL | Status: DC

## 2012-10-30 ENCOUNTER — Telehealth (HOSPITAL_COMMUNITY): Payer: Self-pay

## 2012-10-30 NOTE — Telephone Encounter (Signed)
Call from patient requesting something for sleep.  Stated "I've tried the lorazepam but it has not helped me sleep.  Can Dr. Mariel Sleet or Elijah Birk call in something to help me sleep?  I'm not getting any rest at all"

## 2012-10-30 NOTE — Telephone Encounter (Signed)
I prescribed her Restoril on her last visit here.  Has she tried that?  Is it working?

## 2012-11-06 ENCOUNTER — Encounter (HOSPITAL_COMMUNITY): Payer: PRIVATE HEALTH INSURANCE | Attending: Oncology

## 2012-11-06 ENCOUNTER — Encounter (HOSPITAL_BASED_OUTPATIENT_CLINIC_OR_DEPARTMENT_OTHER): Payer: PRIVATE HEALTH INSURANCE | Admitting: Oncology

## 2012-11-06 VITALS — BP 152/93 | HR 91 | Temp 97.8°F | Resp 18 | Wt 193.4 lb

## 2012-11-06 DIAGNOSIS — G4700 Insomnia, unspecified: Secondary | ICD-10-CM | POA: Insufficient documentation

## 2012-11-06 DIAGNOSIS — R0989 Other specified symptoms and signs involving the circulatory and respiratory systems: Secondary | ICD-10-CM | POA: Insufficient documentation

## 2012-11-06 DIAGNOSIS — R109 Unspecified abdominal pain: Secondary | ICD-10-CM

## 2012-11-06 DIAGNOSIS — R110 Nausea: Secondary | ICD-10-CM

## 2012-11-06 DIAGNOSIS — R0609 Other forms of dyspnea: Secondary | ICD-10-CM | POA: Insufficient documentation

## 2012-11-06 DIAGNOSIS — C189 Malignant neoplasm of colon, unspecified: Secondary | ICD-10-CM

## 2012-11-06 DIAGNOSIS — R197 Diarrhea, unspecified: Secondary | ICD-10-CM | POA: Insufficient documentation

## 2012-11-06 LAB — COMPREHENSIVE METABOLIC PANEL
AST: 20 U/L (ref 0–37)
Alkaline Phosphatase: 65 U/L (ref 39–117)
BUN: 12 mg/dL (ref 6–23)
CO2: 27 mEq/L (ref 19–32)
Chloride: 103 mEq/L (ref 96–112)
Creatinine, Ser: 0.8 mg/dL (ref 0.50–1.10)
GFR calc non Af Amer: 77 mL/min — ABNORMAL LOW (ref >90)
Potassium: 4.2 mEq/L (ref 3.5–5.1)
Total Bilirubin: 0.3 mg/dL (ref 0.3–1.2)

## 2012-11-06 LAB — URINALYSIS, DIPSTICK ONLY
Bilirubin Urine: NEGATIVE
Ketones, ur: NEGATIVE mg/dL
Leukocytes, UA: NEGATIVE
Nitrite: NEGATIVE
Urobilinogen, UA: 0.2 mg/dL (ref 0.0–1.0)
pH: 6 (ref 5.0–8.0)

## 2012-11-06 LAB — CBC WITH DIFFERENTIAL/PLATELET
Basophils Absolute: 0 10*3/uL (ref 0.0–0.1)
HCT: 32.3 % — ABNORMAL LOW (ref 36.0–46.0)
Hemoglobin: 10.7 g/dL — ABNORMAL LOW (ref 12.0–15.0)
Lymphocytes Relative: 39 % (ref 12–46)
Monocytes Absolute: 0.4 10*3/uL (ref 0.1–1.0)
Monocytes Relative: 11 % (ref 3–12)
Neutro Abs: 1.7 10*3/uL (ref 1.7–7.7)
Neutrophils Relative %: 49 % (ref 43–77)
WBC: 3.5 10*3/uL — ABNORMAL LOW (ref 4.0–10.5)

## 2012-11-06 MED ORDER — ATROPINE SULFATE 0.1 MG/ML IJ SOLN
INTRAMUSCULAR | Status: AC
  Filled 2012-11-06: qty 10

## 2012-11-06 MED ORDER — FLUOROURACIL CHEMO INJECTION 2.5 GM/50ML
400.0000 mg/m2 | Freq: Once | INTRAVENOUS | Status: AC
  Administered 2012-11-06: 750 mg via INTRAVENOUS
  Filled 2012-11-06: qty 15

## 2012-11-06 MED ORDER — HYDROMORPHONE HCL PF 1 MG/ML IJ SOLN
0.5000 mg | INTRAMUSCULAR | Status: AC
  Administered 2012-11-06: 0.5 mg via INTRAVENOUS

## 2012-11-06 MED ORDER — HYDROMORPHONE HCL PF 1 MG/ML IJ SOLN
INTRAMUSCULAR | Status: AC
  Filled 2012-11-06: qty 1

## 2012-11-06 MED ORDER — LEUCOVORIN CALCIUM INJECTION 100 MG
20.0000 mg/m2 | Freq: Once | INTRAMUSCULAR | Status: AC
  Administered 2012-11-06: 38 mg via INTRAVENOUS
  Filled 2012-11-06: qty 1.9

## 2012-11-06 MED ORDER — SODIUM CHLORIDE 0.9 % IV SOLN
Freq: Once | INTRAVENOUS | Status: AC
  Administered 2012-11-06: 11:00:00 via INTRAVENOUS

## 2012-11-06 MED ORDER — LORAZEPAM 2 MG/ML IJ SOLN
0.5000 mg | Freq: Once | INTRAMUSCULAR | Status: AC
  Administered 2012-11-06: 0.5 mg via INTRAVENOUS

## 2012-11-06 MED ORDER — SODIUM CHLORIDE 0.9 % IV SOLN
2400.0000 mg/m2 | INTRAVENOUS | Status: DC
  Administered 2012-11-06: 4550 mg via INTRAVENOUS
  Filled 2012-11-06 (×2): qty 91

## 2012-11-06 MED ORDER — SODIUM CHLORIDE 0.9 % IV SOLN
5.0000 mg/kg | Freq: Once | INTRAVENOUS | Status: AC
  Administered 2012-11-06: 425 mg via INTRAVENOUS
  Filled 2012-11-06: qty 17

## 2012-11-06 MED ORDER — SODIUM CHLORIDE 0.9 % IV SOLN
Freq: Once | INTRAVENOUS | Status: AC
  Administered 2012-11-06: 16 mg via INTRAVENOUS
  Filled 2012-11-06: qty 8

## 2012-11-06 MED ORDER — ATROPINE SULFATE 1 MG/ML IJ SOLN
0.5000 mg | Freq: Once | INTRAMUSCULAR | Status: AC | PRN
  Administered 2012-11-06: 0.5 mg via INTRAVENOUS
  Filled 2012-11-06: qty 0.5

## 2012-11-06 MED ORDER — IRINOTECAN HCL CHEMO INJECTION 100 MG/5ML
180.0000 mg/m2 | Freq: Once | INTRAVENOUS | Status: AC
  Administered 2012-11-06: 342 mg via INTRAVENOUS
  Filled 2012-11-06: qty 17.1

## 2012-11-06 MED ORDER — LORAZEPAM 2 MG/ML IJ SOLN
INTRAMUSCULAR | Status: AC
  Filled 2012-11-06: qty 1

## 2012-11-06 NOTE — Progress Notes (Signed)
  Yolanda Friedman is here today for chemotherapy administration. She is here for day 1 of cycle 4 of FOLFIRI plus Avastin.  The patient had a discussion with nurse regarding her diarrhea following chemotherapy. This was well treated at home with Imodium. We'll try IV atropine today and the patient is agreeable to this plan.  The nurses notes that Yolanda Friedman becomes nauseated about 30-45 minutes into her Camptosar chemotherapy infusion.  This has become irregular issue for the patient and it is well treated with Ativan IV. As a result, I have added Ativan 0.5 mg IV medication to her premeds for this nausea. Hopefully this will abort this discomfort in the future.  I was asked to see the patient while she was getting chemotherapy for sudden onset of abdominal pain.  She was given 0.5 mg of Ativan IV for the aforementioned nausea. This cause her fall sleep. Her daughter arrived in the clinic visit with her mom. When Yolanda Friedman was awoken she appreciated intense abdominal pain. I was therefore asked to see the patient.  He reports that his abdominal discomfort occurs at home. She avoids pain medication for this. It usually resolves on its own after some time. She describes the pain as an earache in her abdomen.  She was noted to be in a position in bed lying on her right side. She reports that the pain is in the middle of her abdomen. It seems to be in the umbilical or low epigastric region. She does not appreciate a pattern with food products at home regarding this discomfort. She denies any nausea or vomiting at this time. Abdomen is soft examination and palpation, even deep palpation, does not reduce the pain.  As a result, I given an order for 0.5 mg of Dilaudid IV now.  The patient was checked in 15 minutes and she reports that her abdominal pain has completely resolved.  Patient and plan will be discussed with Yolanda Friedman within the next 24 hours.    KEFALAS,THOMAS

## 2012-11-06 NOTE — Progress Notes (Signed)
1209 Pt complains of nausea during chemo. Lorazepam 0.5mg  IV given. 1225 Pt sleeping soundly. 1300 Pt complains of severe abdominal pain. Pt has tears in her eyes. T.Kefalas, PA in to see pt. 1315 IV dilaudid 0.5mg  given. 1330 T.Kefalas, PA in to see pt, pt verbalized abdominal pain resolved.

## 2012-11-08 ENCOUNTER — Encounter (HOSPITAL_BASED_OUTPATIENT_CLINIC_OR_DEPARTMENT_OTHER): Payer: PRIVATE HEALTH INSURANCE

## 2012-11-08 DIAGNOSIS — C189 Malignant neoplasm of colon, unspecified: Secondary | ICD-10-CM

## 2012-11-08 MED ORDER — HEPARIN SOD (PORK) LOCK FLUSH 100 UNIT/ML IV SOLN
500.0000 [IU] | Freq: Once | INTRAVENOUS | Status: AC | PRN
  Administered 2012-11-08: 500 [IU]
  Filled 2012-11-08: qty 5

## 2012-11-08 MED ORDER — SODIUM CHLORIDE 0.9 % IJ SOLN
10.0000 mL | INTRAMUSCULAR | Status: DC | PRN
  Administered 2012-11-08: 10 mL
  Filled 2012-11-08: qty 10

## 2012-11-08 MED ORDER — HEPARIN SOD (PORK) LOCK FLUSH 100 UNIT/ML IV SOLN
INTRAVENOUS | Status: AC
  Filled 2012-11-08: qty 5

## 2012-11-08 NOTE — Progress Notes (Signed)
Yolanda Friedman presents for deaccess of home infusion.  Port deaccessed, see docflowsheet for details.  Deveron Furlong given appointments.  No questions at this time and she was d/c'd w/o incident.

## 2012-11-11 ENCOUNTER — Ambulatory Visit (HOSPITAL_COMMUNITY): Payer: Medicaid Other | Admitting: Oncology

## 2012-11-15 ENCOUNTER — Ambulatory Visit (HOSPITAL_COMMUNITY): Payer: Medicaid Other | Admitting: Oncology

## 2012-11-20 ENCOUNTER — Inpatient Hospital Stay (HOSPITAL_COMMUNITY): Payer: Medicaid Other

## 2012-11-20 ENCOUNTER — Ambulatory Visit (HOSPITAL_COMMUNITY): Payer: Medicare Other | Admitting: Oncology

## 2012-11-22 ENCOUNTER — Encounter (HOSPITAL_COMMUNITY): Payer: Medicaid Other

## 2012-11-27 ENCOUNTER — Encounter (HOSPITAL_BASED_OUTPATIENT_CLINIC_OR_DEPARTMENT_OTHER): Payer: PRIVATE HEALTH INSURANCE

## 2012-11-27 ENCOUNTER — Ambulatory Visit (HOSPITAL_COMMUNITY): Payer: Medicare Other | Admitting: Oncology

## 2012-11-27 ENCOUNTER — Encounter (HOSPITAL_BASED_OUTPATIENT_CLINIC_OR_DEPARTMENT_OTHER): Payer: PRIVATE HEALTH INSURANCE | Admitting: Oncology

## 2012-11-27 VITALS — BP 134/80 | HR 80 | Temp 98.2°F | Resp 18 | Wt 191.0 lb

## 2012-11-27 DIAGNOSIS — R110 Nausea: Secondary | ICD-10-CM

## 2012-11-27 DIAGNOSIS — G4700 Insomnia, unspecified: Secondary | ICD-10-CM

## 2012-11-27 DIAGNOSIS — R0602 Shortness of breath: Secondary | ICD-10-CM

## 2012-11-27 DIAGNOSIS — Z5112 Encounter for antineoplastic immunotherapy: Secondary | ICD-10-CM

## 2012-11-27 DIAGNOSIS — C189 Malignant neoplasm of colon, unspecified: Secondary | ICD-10-CM

## 2012-11-27 DIAGNOSIS — C7960 Secondary malignant neoplasm of unspecified ovary: Secondary | ICD-10-CM

## 2012-11-27 DIAGNOSIS — R197 Diarrhea, unspecified: Secondary | ICD-10-CM

## 2012-11-27 DIAGNOSIS — Z5111 Encounter for antineoplastic chemotherapy: Secondary | ICD-10-CM

## 2012-11-27 LAB — COMPREHENSIVE METABOLIC PANEL
ALT: 18 U/L (ref 0–35)
AST: 26 U/L (ref 0–37)
Albumin: 3.9 g/dL (ref 3.5–5.2)
Alkaline Phosphatase: 62 U/L (ref 39–117)
CO2: 27 mEq/L (ref 19–32)
Chloride: 105 mEq/L (ref 96–112)
Creatinine, Ser: 0.72 mg/dL (ref 0.50–1.10)
GFR calc non Af Amer: >90 mL/min (ref >90)
Potassium: 3.7 mEq/L (ref 3.5–5.1)
Sodium: 139 mEq/L (ref 135–145)
Total Bilirubin: 0.3 mg/dL (ref 0.3–1.2)

## 2012-11-27 LAB — CBC WITH DIFFERENTIAL/PLATELET
Basophils Absolute: 0 10*3/uL (ref 0.0–0.1)
HCT: 36.2 % (ref 36.0–46.0)
Lymphocytes Relative: 44 % (ref 12–46)
Monocytes Absolute: 0.4 10*3/uL (ref 0.1–1.0)
Neutro Abs: 1.7 10*3/uL (ref 1.7–7.7)
Neutrophils Relative %: 44 % (ref 43–77)
RDW: 16.8 % — ABNORMAL HIGH (ref 11.5–15.5)
WBC: 3.8 10*3/uL — ABNORMAL LOW (ref 4.0–10.5)

## 2012-11-27 LAB — URINALYSIS, DIPSTICK ONLY
Nitrite: NEGATIVE
Protein, ur: NEGATIVE mg/dL
Specific Gravity, Urine: >1.03 — ABNORMAL HIGH (ref 1.005–1.030)
Urobilinogen, UA: 0.2 mg/dL (ref 0.0–1.0)

## 2012-11-27 LAB — CEA: CEA: 10.6 ng/mL — ABNORMAL HIGH (ref 0.0–5.0)

## 2012-11-27 MED ORDER — TEMAZEPAM 15 MG PO CAPS: ORAL_CAPSULE | ORAL | Status: DC

## 2012-11-27 MED ORDER — LORAZEPAM 2 MG/ML IJ SOLN
0.5000 mg | Freq: Once | INTRAMUSCULAR | Status: AC
  Administered 2012-11-27: 0.5 mg via INTRAVENOUS

## 2012-11-27 MED ORDER — ALBUTEROL SULFATE HFA 108 (90 BASE) MCG/ACT IN AERS: 2.0000 | INHALATION_SPRAY | Freq: Four times a day (QID) | RESPIRATORY_TRACT | Status: DC | PRN

## 2012-11-27 MED ORDER — SODIUM CHLORIDE 0.9 % IV SOLN
Freq: Once | INTRAVENOUS | Status: AC
  Administered 2012-11-27: 16 mg via INTRAVENOUS
  Filled 2012-11-27: qty 8

## 2012-11-27 MED ORDER — DIPHENOXYLATE-ATROPINE 2.5-0.025 MG PO TABS
2.0000 | ORAL_TABLET | Freq: Once | ORAL | Status: AC
  Administered 2012-11-27: 2 via ORAL
  Filled 2012-11-27: qty 2

## 2012-11-27 MED ORDER — SODIUM CHLORIDE 0.9 % IV SOLN
Freq: Once | INTRAVENOUS | Status: AC
  Administered 2012-11-27: 09:00:00 via INTRAVENOUS

## 2012-11-27 MED ORDER — IRINOTECAN HCL CHEMO INJECTION 100 MG/5ML
180.0000 mg/m2 | Freq: Once | INTRAVENOUS | Status: AC
  Administered 2012-11-27: 342 mg via INTRAVENOUS
  Filled 2012-11-27: qty 17.1

## 2012-11-27 MED ORDER — SODIUM CHLORIDE 0.9 % IV SOLN
5.0000 mg/kg | Freq: Once | INTRAVENOUS | Status: AC
  Administered 2012-11-27: 425 mg via INTRAVENOUS
  Filled 2012-11-27: qty 17

## 2012-11-27 MED ORDER — LORAZEPAM 2 MG/ML IJ SOLN
INTRAMUSCULAR | Status: AC
  Filled 2012-11-27: qty 1

## 2012-11-27 MED ORDER — LEUCOVORIN CALCIUM INJECTION 100 MG
20.0000 mg/m2 | Freq: Once | INTRAMUSCULAR | Status: AC
  Administered 2012-11-27: 38 mg via INTRAVENOUS
  Filled 2012-11-27: qty 1.9

## 2012-11-27 MED ORDER — FLUOROURACIL CHEMO INJECTION 5 GM/100ML
2400.0000 mg/m2 | INTRAVENOUS | Status: DC
  Administered 2012-11-27: 4550 mg via INTRAVENOUS
  Filled 2012-11-27 (×2): qty 91

## 2012-11-27 MED ORDER — SODIUM CHLORIDE 0.9 % IJ SOLN
10.0000 mL | INTRAMUSCULAR | Status: DC | PRN
  Filled 2012-11-27: qty 10

## 2012-11-27 MED ORDER — FLUOROURACIL CHEMO INJECTION 2.5 GM/50ML
400.0000 mg/m2 | Freq: Once | INTRAVENOUS | Status: AC
  Administered 2012-11-27: 750 mg via INTRAVENOUS
  Filled 2012-11-27: qty 15

## 2012-11-27 NOTE — Progress Notes (Signed)
1032 lomotil given for co diarrhea and Dr. Mariel Sleet in to see patient 68 states 1 bm since lomotil given. C/o nausea and stomach griping. Ativan repeated. 1430 discharged home after stating some relief from ativan.

## 2012-11-27 NOTE — Progress Notes (Signed)
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 she is now on full. Plus Avastin and is here today for cycle #5 of a planned 6 prior to CAT scans. Interestingly her CEA has gone down after the first couple treatments. That is a good sign. She does have some shortness of breath on exertion and she thinks she hears some wheezing or presently she is very clear today. She had an inhaler in the past and would like that to call in and we will do that. She also doesn't sleep well at night but does get good sleep with Restoril 15-30 mg and usually is a 30 mg a works for her.  She is accompanied by her one son who lives in the area. Other son lives in [place].  She also has some anticipatory diarrhea prior to each treatment. We have given her to Lomotil here since she arrived.  She is very little nausea or vomiting with any therapy but also has some anticipatory nausea at the start of each treatment. That has been helped by IV Ativan.  She is in no acute distress today. Pulses 80 and regular. Respirations 16-18 and unlabored. Once again there no wheezes rales or rhonchi on her lung exam. She has an intact Port-A-Cath right upper chest wall. Heart shows a regular rhythm and rate without murmur rub or gallop. Abdomen remains soft and nontender without obvious ascites. She has no hepatosplenomegaly. She has no peripheral edema. She actually looks very good today but does have increased pigmentation in her face and in her hands from the 5-FU.  We will see her right after the scans which are due in approximately 3 weeks.

## 2012-11-28 ENCOUNTER — Other Ambulatory Visit (HOSPITAL_COMMUNITY): Payer: Self-pay | Admitting: Oncology

## 2012-11-28 DIAGNOSIS — K219 Gastro-esophageal reflux disease without esophagitis: Secondary | ICD-10-CM

## 2012-11-28 DIAGNOSIS — C189 Malignant neoplasm of colon, unspecified: Secondary | ICD-10-CM

## 2012-11-28 DIAGNOSIS — I10 Essential (primary) hypertension: Secondary | ICD-10-CM

## 2012-11-28 DIAGNOSIS — R110 Nausea: Secondary | ICD-10-CM

## 2012-11-28 MED ORDER — OMEPRAZOLE 20 MG PO CPDR: 20.0000 mg | DELAYED_RELEASE_CAPSULE | Freq: Two times a day (BID) | ORAL | Status: DC

## 2012-11-28 MED ORDER — HYDROCODONE-ACETAMINOPHEN 10-325 MG PO TABS: 1.0000 | ORAL_TABLET | Freq: Four times a day (QID) | ORAL | Status: DC | PRN

## 2012-11-28 MED ORDER — ONDANSETRON HCL 8 MG PO TABS: ORAL_TABLET | ORAL | Status: DC

## 2012-11-28 MED ORDER — AMLODIPINE BESYLATE-VALSARTAN 5-160 MG PO TABS: 1.0000 | ORAL_TABLET | Freq: Every day | ORAL | Status: DC

## 2012-11-29 ENCOUNTER — Encounter (HOSPITAL_BASED_OUTPATIENT_CLINIC_OR_DEPARTMENT_OTHER): Payer: PRIVATE HEALTH INSURANCE

## 2012-11-29 DIAGNOSIS — C189 Malignant neoplasm of colon, unspecified: Secondary | ICD-10-CM

## 2012-11-29 DIAGNOSIS — Z452 Encounter for adjustment and management of vascular access device: Secondary | ICD-10-CM

## 2012-11-29 MED ORDER — HEPARIN SOD (PORK) LOCK FLUSH 100 UNIT/ML IV SOLN
INTRAVENOUS | Status: AC
  Filled 2012-11-29: qty 5

## 2012-11-29 MED ORDER — SODIUM CHLORIDE 0.9 % IJ SOLN
10.0000 mL | INTRAMUSCULAR | Status: DC | PRN
  Administered 2012-11-29: 10 mL
  Filled 2012-11-29: qty 10

## 2012-11-29 MED ORDER — HEPARIN SOD (PORK) LOCK FLUSH 100 UNIT/ML IV SOLN
500.0000 [IU] | Freq: Once | INTRAVENOUS | Status: AC | PRN
  Administered 2012-11-29: 500 [IU]
  Filled 2012-11-29: qty 5

## 2012-11-29 NOTE — Progress Notes (Signed)
Yolanda Friedman presents to have home infusion pump d/c'd and for port-a-cath deaccess with flush.  Proper placement of portacath confirmed by CXR.  Portacath located right chest wall accessed with  H 20 needle.  Good blood return present. Portacath flushed with NS and 500U/5ml Heparin, and needle removed intact.  Procedure tolerated well and without incident.

## 2012-12-11 ENCOUNTER — Other Ambulatory Visit (HOSPITAL_COMMUNITY): Payer: Medicare Other

## 2012-12-11 ENCOUNTER — Encounter (HOSPITAL_COMMUNITY): Payer: PRIVATE HEALTH INSURANCE | Attending: Oncology

## 2012-12-11 VITALS — BP 151/76 | HR 83 | Temp 98.1°F | Resp 18 | Wt 195.2 lb

## 2012-12-11 DIAGNOSIS — Z5111 Encounter for antineoplastic chemotherapy: Secondary | ICD-10-CM

## 2012-12-11 DIAGNOSIS — Z5112 Encounter for antineoplastic immunotherapy: Secondary | ICD-10-CM

## 2012-12-11 DIAGNOSIS — C189 Malignant neoplasm of colon, unspecified: Secondary | ICD-10-CM | POA: Insufficient documentation

## 2012-12-11 DIAGNOSIS — K1370 Unspecified lesions of oral mucosa: Secondary | ICD-10-CM | POA: Insufficient documentation

## 2012-12-11 DIAGNOSIS — C7960 Secondary malignant neoplasm of unspecified ovary: Secondary | ICD-10-CM

## 2012-12-11 DIAGNOSIS — R197 Diarrhea, unspecified: Secondary | ICD-10-CM

## 2012-12-11 LAB — COMPREHENSIVE METABOLIC PANEL
BUN: 15 mg/dL (ref 6–23)
CO2: 28 mEq/L (ref 19–32)
Calcium: 9.7 mg/dL (ref 8.4–10.5)
Creatinine, Ser: 0.85 mg/dL (ref 0.50–1.10)
GFR calc Af Amer: 83 mL/min — ABNORMAL LOW (ref >90)
GFR calc non Af Amer: 72 mL/min — ABNORMAL LOW (ref >90)
Glucose, Bld: 118 mg/dL — ABNORMAL HIGH (ref 70–99)
Total Protein: 6.6 g/dL (ref 6.0–8.3)

## 2012-12-11 LAB — CBC WITH DIFFERENTIAL/PLATELET
Eosinophils Absolute: 0.1 10*3/uL (ref 0.0–0.7)
Eosinophils Relative: 2 % (ref 0–5)
HCT: 35.2 % — ABNORMAL LOW (ref 36.0–46.0)
Lymphocytes Relative: 36 % (ref 12–46)
Lymphs Abs: 1.6 10*3/uL (ref 0.7–4.0)
MCH: 30.6 pg (ref 26.0–34.0)
MCV: 94.6 fL (ref 78.0–100.0)
Monocytes Absolute: 0.4 10*3/uL (ref 0.1–1.0)
RBC: 3.72 MIL/uL — ABNORMAL LOW (ref 3.87–5.11)
WBC: 4.5 10*3/uL (ref 4.0–10.5)

## 2012-12-11 LAB — CEA: CEA: 12.2 ng/mL — ABNORMAL HIGH (ref 0.0–5.0)

## 2012-12-11 MED ORDER — SODIUM CHLORIDE 0.9 % IJ SOLN
10.0000 mL | INTRAMUSCULAR | Status: DC | PRN
  Filled 2012-12-11: qty 10

## 2012-12-11 MED ORDER — LOPERAMIDE HCL 2 MG PO CAPS
4.0000 mg | ORAL_CAPSULE | Freq: Once | ORAL | Status: AC
  Administered 2012-12-11: 4 mg via ORAL
  Filled 2012-12-11: qty 2

## 2012-12-11 MED ORDER — ATROPINE SULFATE 1 MG/ML IJ SOLN
0.5000 mg | Freq: Once | INTRAMUSCULAR | Status: DC | PRN
  Filled 2012-12-11: qty 0.5

## 2012-12-11 MED ORDER — LEUCOVORIN CALCIUM INJECTION 100 MG
20.0000 mg/m2 | Freq: Once | INTRAMUSCULAR | Status: AC
  Administered 2012-12-11: 38 mg via INTRAVENOUS
  Filled 2012-12-11: qty 1.9

## 2012-12-11 MED ORDER — LORAZEPAM 2 MG/ML IJ SOLN
0.5000 mg | Freq: Once | INTRAMUSCULAR | Status: AC
  Administered 2012-12-11: 0.5 mg via INTRAVENOUS

## 2012-12-11 MED ORDER — SODIUM CHLORIDE 0.9 % IV SOLN
5.0000 mg/kg | Freq: Once | INTRAVENOUS | Status: AC
  Administered 2012-12-11: 425 mg via INTRAVENOUS
  Filled 2012-12-11: qty 17

## 2012-12-11 MED ORDER — SODIUM CHLORIDE 0.9 % IV SOLN
2400.0000 mg/m2 | INTRAVENOUS | Status: DC
  Administered 2012-12-11: 4550 mg via INTRAVENOUS
  Filled 2012-12-11 (×2): qty 91

## 2012-12-11 MED ORDER — LORAZEPAM 2 MG/ML IJ SOLN
INTRAMUSCULAR | Status: AC
  Filled 2012-12-11: qty 1

## 2012-12-11 MED ORDER — SODIUM CHLORIDE 0.9 % IV SOLN
Freq: Once | INTRAVENOUS | Status: AC
  Administered 2012-12-11: 10:00:00 via INTRAVENOUS

## 2012-12-11 MED ORDER — FLUOROURACIL CHEMO INJECTION 2.5 GM/50ML
400.0000 mg/m2 | Freq: Once | INTRAVENOUS | Status: AC
  Administered 2012-12-11: 750 mg via INTRAVENOUS
  Filled 2012-12-11: qty 15

## 2012-12-11 MED ORDER — SODIUM CHLORIDE 0.9 % IV SOLN
Freq: Once | INTRAVENOUS | Status: AC
  Administered 2012-12-11: 16 mg via INTRAVENOUS
  Filled 2012-12-11: qty 8

## 2012-12-11 MED ORDER — IRINOTECAN HCL CHEMO INJECTION 100 MG/5ML
180.0000 mg/m2 | Freq: Once | INTRAVENOUS | Status: AC
  Administered 2012-12-11: 342 mg via INTRAVENOUS
  Filled 2012-12-11: qty 17.1

## 2012-12-11 MED ORDER — ATROPINE SULFATE 0.1 MG/ML IJ SOLN
INTRAMUSCULAR | Status: AC
  Filled 2012-12-11: qty 10

## 2012-12-11 NOTE — Progress Notes (Signed)
Patient c/o diarrhea prior to chemo and imodium given. No further c/o after chemo started. Tolerated chemo well.

## 2012-12-13 ENCOUNTER — Encounter (HOSPITAL_BASED_OUTPATIENT_CLINIC_OR_DEPARTMENT_OTHER): Payer: PRIVATE HEALTH INSURANCE

## 2012-12-13 ENCOUNTER — Ambulatory Visit (HOSPITAL_COMMUNITY): Payer: Medicare Other | Admitting: Oncology

## 2012-12-13 DIAGNOSIS — C189 Malignant neoplasm of colon, unspecified: Secondary | ICD-10-CM

## 2012-12-13 DIAGNOSIS — Z452 Encounter for adjustment and management of vascular access device: Secondary | ICD-10-CM

## 2012-12-13 DIAGNOSIS — C7960 Secondary malignant neoplasm of unspecified ovary: Secondary | ICD-10-CM

## 2012-12-13 DIAGNOSIS — K1379 Other lesions of oral mucosa: Secondary | ICD-10-CM

## 2012-12-13 MED ORDER — HEPARIN SOD (PORK) LOCK FLUSH 100 UNIT/ML IV SOLN
INTRAVENOUS | Status: AC
  Filled 2012-12-13: qty 5

## 2012-12-13 MED ORDER — SODIUM CHLORIDE 0.9 % IJ SOLN
10.0000 mL | Freq: Once | INTRAMUSCULAR | Status: AC
  Administered 2012-12-13: 10 mL via INTRAVENOUS
  Filled 2012-12-13: qty 10

## 2012-12-13 MED ORDER — HEPARIN SOD (PORK) LOCK FLUSH 100 UNIT/ML IV SOLN
500.0000 [IU] | Freq: Once | INTRAVENOUS | Status: AC
  Administered 2012-12-13: 500 [IU] via INTRAVENOUS
  Filled 2012-12-13: qty 5

## 2012-12-13 MED ORDER — MAGIC MOUTHWASH: 5.0000 mL | Freq: Four times a day (QID) | ORAL | Status: DC

## 2012-12-13 NOTE — Progress Notes (Signed)
Yolanda Friedman presented for Portacath deaccess and flush after continous 5 fu complete. Tolerated well.. Proper placement of portacath confirmed by CXR. Portacath located rt chest wall deaccessed. Good blood return present. Portacath flushed with 20ml NS and 500U/5ml Heparin and needle removed intact. Procedure without incident. Patient tolerated procedure well.

## 2012-12-16 ENCOUNTER — Ambulatory Visit (HOSPITAL_COMMUNITY): Payer: Medicare Other | Admitting: Oncology

## 2012-12-16 ENCOUNTER — Other Ambulatory Visit (HOSPITAL_COMMUNITY): Payer: Self-pay | Admitting: *Deleted

## 2012-12-17 ENCOUNTER — Telehealth (HOSPITAL_COMMUNITY): Payer: Self-pay | Admitting: *Deleted

## 2012-12-17 NOTE — Telephone Encounter (Signed)
[facility] called into [facility] in [place], [place] where pt is visiting at present. Instructions 1 tsp swish and swallow QID #319ml. No Refills. Patient notified. Patient will be back next week to resume chemotherapy. Patient instructed that if her mouth/throat doesn't get better in a couple of days to visit an Urgent Care and have them look in her throat.

## 2012-12-18 ENCOUNTER — Ambulatory Visit (HOSPITAL_COMMUNITY): Admission: RE | Admit: 2012-12-18 | Payer: PRIVATE HEALTH INSURANCE | Source: Ambulatory Visit

## 2012-12-20 ENCOUNTER — Ambulatory Visit (HOSPITAL_COMMUNITY): Payer: Medicare Other | Admitting: Oncology

## 2012-12-23 ENCOUNTER — Ambulatory Visit (HOSPITAL_COMMUNITY): Payer: Medicare Other | Admitting: Oncology

## 2012-12-25 ENCOUNTER — Other Ambulatory Visit (HOSPITAL_COMMUNITY): Payer: Self-pay | Admitting: Oncology

## 2012-12-25 ENCOUNTER — Encounter (HOSPITAL_COMMUNITY): Payer: PRIVATE HEALTH INSURANCE

## 2012-12-25 VITALS — BP 122/82 | HR 90 | Temp 97.6°F | Resp 20 | Wt 191.0 lb

## 2012-12-25 DIAGNOSIS — C189 Malignant neoplasm of colon, unspecified: Secondary | ICD-10-CM

## 2012-12-25 LAB — COMPREHENSIVE METABOLIC PANEL
ALT: 19 U/L (ref 0–35)
AST: 22 U/L (ref 0–37)
Albumin: 3.3 g/dL — ABNORMAL LOW (ref 3.5–5.2)
Calcium: 9.3 mg/dL (ref 8.4–10.5)
Sodium: 138 mEq/L (ref 135–145)
Total Protein: 6.4 g/dL (ref 6.0–8.3)

## 2012-12-25 LAB — CBC WITH DIFFERENTIAL/PLATELET
Basophils Absolute: 0 10*3/uL (ref 0.0–0.1)
Basophils Relative: 0 % (ref 0–1)
HCT: 32.9 % — ABNORMAL LOW (ref 36.0–46.0)
Hemoglobin: 10.8 g/dL — ABNORMAL LOW (ref 12.0–15.0)
Lymphs Abs: 1.5 10*3/uL (ref 0.7–4.0)
MCH: 31 pg (ref 26.0–34.0)
Monocytes Absolute: 0.4 10*3/uL (ref 0.1–1.0)
Monocytes Relative: 12 % (ref 3–12)
Neutro Abs: 1.3 10*3/uL — ABNORMAL LOW (ref 1.7–7.7)
Neutrophils Relative %: 41 % — ABNORMAL LOW (ref 43–77)
RBC: 3.48 MIL/uL — ABNORMAL LOW (ref 3.87–5.11)

## 2012-12-25 MED ORDER — HEPARIN SOD (PORK) LOCK FLUSH 100 UNIT/ML IV SOLN
500.0000 [IU] | Freq: Once | INTRAVENOUS | Status: AC | PRN
  Administered 2012-12-25: 500 [IU]
  Filled 2012-12-25: qty 5

## 2012-12-25 MED ORDER — HEPARIN SOD (PORK) LOCK FLUSH 100 UNIT/ML IV SOLN
INTRAVENOUS | Status: AC
  Filled 2012-12-25: qty 5

## 2012-12-25 MED ORDER — SODIUM CHLORIDE 0.9 % IV SOLN
Freq: Once | INTRAVENOUS | Status: AC
  Administered 2012-12-25: 10:00:00 via INTRAVENOUS

## 2012-12-25 NOTE — Progress Notes (Signed)
Chemo held today due to anc 1300 after discussed with Dr. Mariel Sleet. Patient will get scans done on 4/29 as scheduled.

## 2012-12-27 ENCOUNTER — Encounter (HOSPITAL_COMMUNITY): Payer: PRIVATE HEALTH INSURANCE

## 2012-12-31 ENCOUNTER — Encounter (HOSPITAL_BASED_OUTPATIENT_CLINIC_OR_DEPARTMENT_OTHER): Payer: PRIVATE HEALTH INSURANCE

## 2012-12-31 ENCOUNTER — Ambulatory Visit (HOSPITAL_COMMUNITY)
Admission: RE | Admit: 2012-12-31 | Discharge: 2012-12-31 | Disposition: A | Discharge status: Home / Self Care | Payer: PRIVATE HEALTH INSURANCE | Source: Ambulatory Visit | Attending: Oncology | Admitting: Oncology

## 2012-12-31 DIAGNOSIS — C189 Malignant neoplasm of colon, unspecified: Secondary | ICD-10-CM

## 2012-12-31 DIAGNOSIS — R935 Abnormal findings on diagnostic imaging of other abdominal regions, including retroperitoneum: Secondary | ICD-10-CM | POA: Insufficient documentation

## 2012-12-31 DIAGNOSIS — C7960 Secondary malignant neoplasm of unspecified ovary: Secondary | ICD-10-CM

## 2012-12-31 LAB — CBC WITH DIFFERENTIAL/PLATELET
Basophils Absolute: 0 10*3/uL (ref 0.0–0.1)
Basophils Relative: 0 % (ref 0–1)
Eosinophils Absolute: 0.1 10*3/uL (ref 0.0–0.7)
Eosinophils Relative: 2 % (ref 0–5)
HCT: 37.2 % (ref 36.0–46.0)
Hemoglobin: 12.3 g/dL (ref 12.0–15.0)
MCH: 31.3 pg (ref 26.0–34.0)
MCHC: 33.1 g/dL (ref 30.0–36.0)
MCV: 94.7 fL (ref 78.0–100.0)
Monocytes Absolute: 0.5 10*3/uL (ref 0.1–1.0)
Monocytes Relative: 12 % (ref 3–12)
Neutro Abs: 1.5 10*3/uL — ABNORMAL LOW (ref 1.7–7.7)
RDW: 16.3 % — ABNORMAL HIGH (ref 11.5–15.5)

## 2012-12-31 MED ORDER — IOHEXOL 300 MG/ML  SOLN
100.0000 mL | Freq: Once | INTRAMUSCULAR | Status: AC | PRN
  Administered 2012-12-31: 100 mL via INTRAVENOUS

## 2012-12-31 NOTE — Progress Notes (Signed)
Yolanda Friedman presented for Portacath access and flush. Proper placement of portacath confirmed by CXR. Portacath located lt chest wall accessed with  H 20 needle power port needle to be used for patient's CT scam/. Good blood return present. To xray for procedure.

## 2013-01-01 ENCOUNTER — Encounter (HOSPITAL_COMMUNITY): Payer: Self-pay | Admitting: Oncology

## 2013-01-01 ENCOUNTER — Encounter (HOSPITAL_BASED_OUTPATIENT_CLINIC_OR_DEPARTMENT_OTHER): Payer: PRIVATE HEALTH INSURANCE | Admitting: Oncology

## 2013-01-01 ENCOUNTER — Encounter (HOSPITAL_COMMUNITY): Payer: PRIVATE HEALTH INSURANCE

## 2013-01-01 VITALS — BP 125/75 | HR 86 | Temp 97.6°F | Resp 18

## 2013-01-01 DIAGNOSIS — C189 Malignant neoplasm of colon, unspecified: Secondary | ICD-10-CM

## 2013-01-01 DIAGNOSIS — C7960 Secondary malignant neoplasm of unspecified ovary: Secondary | ICD-10-CM

## 2013-01-01 DIAGNOSIS — C7982 Secondary malignant neoplasm of genital organs: Secondary | ICD-10-CM

## 2013-01-01 DIAGNOSIS — C786 Secondary malignant neoplasm of retroperitoneum and peritoneum: Secondary | ICD-10-CM

## 2013-01-01 LAB — URINALYSIS, DIPSTICK ONLY
Glucose, UA: NEGATIVE mg/dL
Ketones, ur: NEGATIVE mg/dL
Leukocytes, UA: NEGATIVE
Nitrite: NEGATIVE
Protein, ur: NEGATIVE mg/dL
Urobilinogen, UA: 0.2 mg/dL (ref 0.0–1.0)

## 2013-01-01 MED ORDER — HEPARIN SOD (PORK) LOCK FLUSH 100 UNIT/ML IV SOLN
INTRAVENOUS | Status: AC
  Filled 2013-01-01: qty 5

## 2013-01-01 NOTE — Progress Notes (Signed)
Treatment held. See MD note

## 2013-01-01 NOTE — Progress Notes (Signed)
This very pleasant 62 year old African American woman was first seen in the spring of 2012 and found to have ovarian masses. At the time of surgical resection and exploration on 12/06/2010 by Dr. 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 Postoperatively she was treated with FOLFOX/Avastin for 12 doses or 6 full cycles ending in 06/27/2011.  Towards the conclusion of the above therapy she was sent for a consultation to Dr. Serafina Royals at [facility] for consideration of heated intraperitoneal chemotherapy. At the time of exploration however she was found to have a lesion on the dome of her liver just below the diaphragm unrecognized by preoperative radiology procedures. She was therefore not felt to be a good candidate for the procedure. After small break in therapy chemotherapy was re\re started in January 2013 once again with 5-FU, leucovorin, Avastin and CPT-11. This was also given for 12 doses ending 02/21/2012. She was then given a respite from chemotherapy. She once again progressed as of January 2014 and was started again on Avastin 5-FU/leucovorin and CPT-11. Because of severe peripheral neuropathy she was not really challenged with oxaliplatinum. She was here today to go over the results of her scans which show progression of disease within the abdomen along the mesentery and peritoneal surfaces.  She is a K-ras mutation positive patient.  What I have recommended to her and her daughter who is with her today is consideration of a consultation with Dr. Reginia Naas at [facility]. They are in agreement with this recommendation and we will make the arrangements.

## 2013-01-01 NOTE — Patient Instructions (Signed)
[facility] Discharge Instructions  RECOMMENDATIONS MADE BY THE CONSULTANT AND ANY TEST RESULTS WILL BE SENT TO YOUR REFERRING PHYSICIAN.  EXAM FINDINGS BY THE PHYSICIAN TODAY AND SIGNS OR SYMPTOMS TO REPORT TO CLINIC OR PRIMARY PHYSICIAN:  Your cancer had progressed and we are going to send you to [facility] for a second opinion.  Your information has been sent to [facility] and you will be seeing Dr. Langston Masker.     SPECIAL INSTRUCTIONS/FOLLOW-UP:  You will follow up with [place] in one month, please see the front desk as you leave for your appointment.  Please call us sooner if you need [place].    Thank you for choosing [facility] to provide your oncology and hematology care.  To afford each patient quality time with our providers, please arrive at least 15 minutes before your scheduled appointment time.  With your help, our goal is to use those 15 minutes to complete the necessary work-up to ensure our physicians have the information they need to help with your evaluation and healthcare recommendations.    Effective January 1st, 2014, we ask that you re-schedule your appointment with our physicians should you arrive 10 or more minutes late for your appointment.  We strive to give you quality time with our providers, and arriving late affects you and other patients whose appointments are after yours.    Again, thank you for choosing [facility].  Our hope is that these requests will decrease the amount of time that you wait before being seen by our physicians.       _____________________________________________________________  Should you have questions after your visit to [facility], please contact our office at [phone] between the hours of 8:30 a.m. and 5:00 p.m.  Voicemails left after 4:30 p.m. will not be returned until the following business day.  For prescription refill requests, have your pharmacy contact our office with your prescription refill  request.

## 2013-01-03 ENCOUNTER — Telehealth (HOSPITAL_COMMUNITY): Payer: Self-pay | Admitting: *Deleted

## 2013-01-03 ENCOUNTER — Encounter (HOSPITAL_COMMUNITY): Payer: PRIVATE HEALTH INSURANCE

## 2013-01-03 MED ORDER — HEPARIN SOD (PORK) LOCK FLUSH 100 UNIT/ML IV SOLN
INTRAVENOUS | Status: AC
  Filled 2013-01-03: qty 5

## 2013-01-03 NOTE — Telephone Encounter (Signed)
Pt called and stated that she had an appt with Dr. Lattie Corns @ [facility] on Tuesday 01/07/13. She requested a copy of her CT scans and PET scans on CD. I called the Radiology Dept and spoke with [facility]. She is going to have these downloaded to a CD and ready for patient on Monday 01/06/13.

## 2013-01-15 ENCOUNTER — Telehealth (HOSPITAL_COMMUNITY): Payer: Self-pay

## 2013-01-15 NOTE — Telephone Encounter (Signed)
error 

## 2013-01-17 ENCOUNTER — Encounter (HOSPITAL_COMMUNITY): Payer: PRIVATE HEALTH INSURANCE | Attending: Oncology | Admitting: Oncology

## 2013-01-17 VITALS — BP 155/95 | HR 103 | Temp 97.9°F | Resp 16 | Wt 191.6 lb

## 2013-01-17 DIAGNOSIS — C7960 Secondary malignant neoplasm of unspecified ovary: Secondary | ICD-10-CM

## 2013-01-17 DIAGNOSIS — Z9889 Other specified postprocedural states: Secondary | ICD-10-CM | POA: Insufficient documentation

## 2013-01-17 DIAGNOSIS — C189 Malignant neoplasm of colon, unspecified: Secondary | ICD-10-CM | POA: Insufficient documentation

## 2013-01-17 MED ORDER — REGORAFENIB 40 MG PO TABS: ORAL_TABLET | ORAL | Status: DC

## 2013-01-17 MED ORDER — REGORAFENIB 40 MG PO TABS: 160.0000 mg | ORAL_TABLET | Freq: Every day | ORAL | Status: DC

## 2013-01-17 NOTE — Progress Notes (Signed)
Patient to start Stivarga orally, to have labs before first dose.  E-Mail sent to Rosana Berger, RN about starting of this treatment.

## 2013-01-17 NOTE — Progress Notes (Signed)
This pleasant 62 year old lady has metastatic adenocarcinoma consistent with colon primary. She has been treated extensively with oxaliplatinum and CPT-11 therapy. She went to [facility] to see Dr. Lattie Corns. She was offered to external studies but would like to try Stivarga prior to starting external study.  She feels very good and as she said if I didn't know from my oncologist that I had cancer I would never know that I had cancer.  She has great organ preservation still which is why she feels good and looks good.  Her vital signs are stable. We will order the Stivarga and the day that she starts the drug we will call her baseline CEA and other blood tests. After 3 months we will see what a CAT scan shows.  We will see her in a month. He spent 20-25 minutes discussing this disorder and in counseling. It should not have sun sensitivity associated with this drug use but she does know what side effects to look for.

## 2013-01-17 NOTE — Patient Instructions (Signed)
[facility] Discharge Instructions  RECOMMENDATIONS MADE BY THE CONSULTANT AND ANY TEST RESULTS WILL BE SENT TO YOUR REFERRING PHYSICIAN.  EXAM FINDINGS BY THE PHYSICIAN TODAY AND SIGNS OR SYMPTOMS TO REPORT TO CLINIC OR PRIMARY PHYSICIAN:   MEDICATIONS PRESCRIBED:  Stivarga to be taken as prescribed.    SPECIAL INSTRUCTIONS/FOLLOW-UP: Return to see [place] the day before or the day of treatment start so we may do your labs.  You will see Elijah Birk a month after you start your treatment.    Thank you for choosing [facility] to provide your oncology and hematology care.  To afford each patient quality time with our providers, please arrive at least 15 minutes before your scheduled appointment time.  With your help, our goal is to use those 15 minutes to complete the necessary work-up to ensure our physicians have the information they need to help with your evaluation and healthcare recommendations.    Effective January 1st, 2014, we ask that you re-schedule your appointment with our physicians should you arrive 10 or more minutes late for your appointment.  We strive to give you quality time with our providers, and arriving late affects you and other patients whose appointments are after yours.    Again, thank you for choosing [facility].  Our hope is that these requests will decrease the amount of time that you wait before being seen by our physicians.       _____________________________________________________________  Should you have questions after your visit to [facility], please contact our office at [phone] between the hours of 8:30 a.m. and 5:00 p.m.  Voicemails left after 4:30 p.m. will not be returned until the following business day.  For prescription refill requests, have your pharmacy contact our office with your prescription refill request.

## 2013-01-17 NOTE — Addendum Note (Signed)
Addended by: Glenford Peers S on: 01/17/2013 04:50 PM   Modules accepted: Orders

## 2013-01-20 ENCOUNTER — Other Ambulatory Visit (HOSPITAL_COMMUNITY): Payer: Self-pay | Admitting: *Deleted

## 2013-01-20 ENCOUNTER — Encounter (HOSPITAL_COMMUNITY): Payer: PRIVATE HEALTH INSURANCE

## 2013-01-20 ENCOUNTER — Encounter (HOSPITAL_BASED_OUTPATIENT_CLINIC_OR_DEPARTMENT_OTHER): Payer: PRIVATE HEALTH INSURANCE

## 2013-01-20 VITALS — BP 137/83 | HR 92

## 2013-01-20 DIAGNOSIS — Z452 Encounter for adjustment and management of vascular access device: Secondary | ICD-10-CM

## 2013-01-20 DIAGNOSIS — C189 Malignant neoplasm of colon, unspecified: Secondary | ICD-10-CM

## 2013-01-20 DIAGNOSIS — Z95828 Presence of other vascular implants and grafts: Secondary | ICD-10-CM

## 2013-01-20 DIAGNOSIS — C7960 Secondary malignant neoplasm of unspecified ovary: Secondary | ICD-10-CM

## 2013-01-20 LAB — CBC WITH DIFFERENTIAL/PLATELET
Basophils Relative: 0 % (ref 0–1)
Eosinophils Absolute: 0.1 10*3/uL (ref 0.0–0.7)
Eosinophils Relative: 1 % (ref 0–5)
Hemoglobin: 12 g/dL (ref 12.0–15.0)
Lymphs Abs: 1.9 10*3/uL (ref 0.7–4.0)
MCH: 30.8 pg (ref 26.0–34.0)
MCHC: 31.5 g/dL (ref 30.0–36.0)
MCV: 97.9 fL (ref 78.0–100.0)
Monocytes Relative: 6 % (ref 3–12)
Neutrophils Relative %: 63 % (ref 43–77)
Platelets: 206 10*3/uL (ref 150–400)
RBC: 3.89 MIL/uL (ref 3.87–5.11)

## 2013-01-20 LAB — COMPREHENSIVE METABOLIC PANEL
ALT: 14 U/L (ref 0–35)
AST: 28 U/L (ref 0–37)
Albumin: 3.7 g/dL (ref 3.5–5.2)
CO2: 25 mEq/L (ref 19–32)
Chloride: 101 mEq/L (ref 96–112)
GFR calc non Af Amer: >90 mL/min (ref >90)
Sodium: 137 mEq/L (ref 135–145)
Total Bilirubin: 0.3 mg/dL (ref 0.3–1.2)

## 2013-01-20 MED ORDER — SODIUM CHLORIDE 0.9 % IJ SOLN
10.0000 mL | Freq: Once | INTRAMUSCULAR | Status: AC
  Administered 2013-01-20: 10 mL via INTRAVENOUS
  Filled 2013-01-20: qty 10

## 2013-01-20 MED ORDER — HEPARIN SOD (PORK) LOCK FLUSH 100 UNIT/ML IV SOLN
500.0000 [IU] | Freq: Once | INTRAVENOUS | Status: AC
  Administered 2013-01-20: 500 [IU] via INTRAVENOUS
  Filled 2013-01-20: qty 5

## 2013-01-20 MED ORDER — HEPARIN SOD (PORK) LOCK FLUSH 100 UNIT/ML IV SOLN
INTRAVENOUS | Status: AC
  Filled 2013-01-20: qty 5

## 2013-01-20 NOTE — Patient Instructions (Addendum)
[facility]   CHEMOTHERAPY INSTRUCTIONS  Stivarga - used to treat colon or rectal cancer that has spread to other parts of the body and for which they have received previous treatment with certain chemotherapy medicines.   You will take 4 tablets a day for 21 days then 7 days off of the medication. Refer to your calendar.    POTENTIAL SIDE EFFECTS OF TREATMENT: Fatigue, tiredness, weakness, diarrhea, loss of appetite, swelling, pain, mouth irritation, voice changes or hoarsenss, pain in other parts of your body, weight loss, nausea, increased susceptibility to infection, changes in character of skin and nails (brittleness, dryness,etc.)  Potential Serious Side Effects:  Severe bleeding, hand-foot skin reaction (redness to palms of hands and soles of feet, very painful), severe skin rash, HIGH Blood Pressure, decreased blood flow to the heart and heart attack, severe headaches, seizure, confusion, change in vision or problems thinking, a tear in the stomach or intestinal wall (any intense pain or swelling in your stomach - we need to know about).    Please alert [place] to any side effects you may be having.   EDUCATIONAL MATERIALS GIVEN AND REVIEWED: Specific Instructions Sheets: Stivarga   SELF CARE ACTIVITIES WHILE ON CHEMOTHERAPY: Increase your fluid intake 48 hours prior to treatment and drink at least 2 quarts per day after treatment., No alcohol intake., No aspirin or other medications unless approved by your oncologist., Eat foods that are light and easy to digest., Eat foods at cold or room temperature., No fried, fatty, or spicy foods immediately before or after treatment., Have teeth cleaned professionally before starting treatment. Keep dentures and partial plates clean., Use soft toothbrush and do not use mouthwashes that contain alcohol. Biotene is a good mouthwash that is available at most pharmacies or may be ordered by calling [phone]., Use  warm salt water gargles (1 teaspoon salt per 1 quart warm water) before and after meals and at bedtime. Or you may rinse with 2 tablespoons of three -percent hydrogen peroxide mixed in eight ounces of water., Always use sunscreen with SPF (Sun Protection Factor) of 30 or higher., Use your nausea medication as directed to prevent nausea. and Use your anti-diarrheal medication as directed to stop diarrhea.  Please wash your hands for at least 30 seconds using warm soapy water. Handwashing is the #1 way to prevent the spread of germs. Stay away from sick people or people who are getting over a cold. If you develop respiratory systems such as green/yellow mucus production or productive cough or persistent cough let us know and we will see if you need an antibiotic. It is a good idea to keep a pair of gloves on when going into grocery stores/[employer] to decrease your risk of coming into contact with germs on the carts, etc. Carry alcohol hand gel with you at all times and use it frequently if out in public. All foods need to be cooked thoroughly. No raw foods. No medium or undercooked meats, eggs. If your food is cooked medium well, it does not need to be hot pink or saturated with bloody liquid at all. Vegetables and fruits need to be washed/rinsed under the faucet with a dish detergent before being consumed. You can eat raw fruits and vegetables unless we tell you otherwise but it would be best if you cooked them or bought frozen. Do not eat off of salad bars or hot bars unless you really trust the cleanliness of the restaurant. If you need  dental work, please let Dr. Mariel Sleet know before you go for your appointment so that we can coordinate the best possible time for you in regards to your chemo regimen. You need to also let your dentist know that you are actively taking chemo. We may need to do labs prior to your dental appointment. We also want your bowels moving at least every other day. If this is not happening,  we need to know so that we can get you on a bowel regimen to help you go.    SYMPTOMS TO REPORT AS SOON AS POSSIBLE AFTER TREATMENT:  FEVER GREATER THAN 100.5 F  CHILLS WITH OR WITHOUT FEVER  NAUSEA AND VOMITING THAT IS NOT CONTROLLED WITH YOUR NAUSEA MEDICATION  UNUSUAL SHORTNESS OF BREATH  UNUSUAL BRUISING OR BLEEDING  TENDERNESS IN MOUTH AND THROAT WITH OR WITHOUT PRESENCE OF ULCERS  URINARY PROBLEMS  BOWEL PROBLEMS  UNUSUAL RASH    Wear comfortable clothing and clothing appropriate for easy access to any Portacath or PICC line. Let us know if there is anything that we can do to make your therapy better!      I have been informed and understand all of the instructions given to me and have received a copy. I have been instructed to call the clinic [phone] or my family physician as soon as possible for continued medical care, if indicated. I do not have any more questions at this time but understand that I may call the Cancer Center or the Patient Navigator at [phone] during office hours should I have questions or need assistance in obtaining follow-up care.      _________________________________________      _______________     __________ Signature of Patient or Authorized Representative        Date                            Time      _________________________________________ Nurse's Signature

## 2013-01-20 NOTE — Progress Notes (Signed)
Chemo teaching done and consent signed for stivarga. Med calendar given to patient. Patient still has zofran tablets at home.

## 2013-01-20 NOTE — Progress Notes (Signed)
Yolanda Friedman presented for Portacath access and flush. Proper placement of portacath confirmed by CXR. Portacath located rt chest wall accessed with  H 20 needle. Good blood return present. Portacath flushed with 20ml NS and 500U/5ml Heparin and needle removed intact. Procedure without incident. Patient tolerated procedure well.   

## 2013-01-21 LAB — CEA: CEA: 12.1 ng/mL — ABNORMAL HIGH (ref 0.0–5.0)

## 2013-01-22 ENCOUNTER — Other Ambulatory Visit (HOSPITAL_COMMUNITY): Payer: Self-pay | Admitting: Oncology

## 2013-01-22 DIAGNOSIS — C189 Malignant neoplasm of colon, unspecified: Secondary | ICD-10-CM

## 2013-01-22 MED ORDER — HYDROCODONE-ACETAMINOPHEN 10-325 MG PO TABS: 1.0000 | ORAL_TABLET | Freq: Four times a day (QID) | ORAL | Status: DC | PRN

## 2013-01-24 ENCOUNTER — Encounter (HOSPITAL_COMMUNITY): Payer: Self-pay | Admitting: *Deleted

## 2013-02-03 ENCOUNTER — Other Ambulatory Visit (HOSPITAL_COMMUNITY): Payer: Self-pay | Admitting: Oncology

## 2013-02-03 ENCOUNTER — Encounter (HOSPITAL_COMMUNITY): Payer: PRIVATE HEALTH INSURANCE | Attending: Oncology

## 2013-02-03 DIAGNOSIS — C7960 Secondary malignant neoplasm of unspecified ovary: Secondary | ICD-10-CM

## 2013-02-03 DIAGNOSIS — C189 Malignant neoplasm of colon, unspecified: Secondary | ICD-10-CM

## 2013-02-03 LAB — CBC WITH DIFFERENTIAL/PLATELET
Eosinophils Relative: 2 % (ref 0–5)
HCT: 42.7 % (ref 36.0–46.0)
Lymphocytes Relative: 34 % (ref 12–46)
Lymphs Abs: 2.4 10*3/uL (ref 0.7–4.0)
MCV: 93.4 fL (ref 78.0–100.0)
Monocytes Absolute: 0.6 10*3/uL (ref 0.1–1.0)
RBC: 4.57 MIL/uL (ref 3.87–5.11)
WBC: 7.1 10*3/uL (ref 4.0–10.5)

## 2013-02-03 LAB — BASIC METABOLIC PANEL WITH GFR
BUN: 11 mg/dL (ref 6–23)
CO2: 27 meq/L (ref 19–32)
Calcium: 9.8 mg/dL (ref 8.4–10.5)
Chloride: 101 meq/L (ref 96–112)
Creatinine, Ser: 0.79 mg/dL (ref 0.50–1.10)
GFR calc Af Amer: >90 mL/min
GFR calc non Af Amer: 87 mL/min — ABNORMAL LOW
Glucose, Bld: 107 mg/dL — ABNORMAL HIGH (ref 70–99)
Potassium: 4.1 meq/L (ref 3.5–5.1)
Sodium: 138 meq/L (ref 135–145)

## 2013-02-03 NOTE — Progress Notes (Signed)
Labs drawn today for cbc,bmp,mg

## 2013-02-16 NOTE — Progress Notes (Signed)
Yolanda Kelch, NP No address on file  Metastatic recurrent adenocarcinoma of colon - Plan: CT Abdomen Pelvis W Contrast, CT Chest W Contrast, CEA, Comprehensive metabolic panel, CBC with Differential  CURRENT THERAPY: Stivara (Regorafenib) 160 mg PO daily x 21 days with 7 day respite.  Started ~01/27/2013  INTERVAL HISTORY: Yolanda Friedman 62 y.o. female returns for  regular  visit for followup of metastatic adenocarcinoma of colon (KRAS MUTATION DETECTED) with progression despite  FOLFOX + Avastin followed by FOLFIRI + Avastin.  She was seen by Dr. Lattie Corns at [facility] who recommended Stivarga versus clinical trial.  Patient has opted for treatment with Stivarga prior to enrolling in a study.  I personally reviewed and went over laboratory results with the patient.  WBC, Hgb and Platelet count are WNL.  Metabolic panel is unremarkable.  CEA on 01/20/2013 was 12.1.  He has a number of complaints which are secondary to her present therapy. She reports bilateral soles of feet tenderness. I do not see any blisters or open sores at this point time. Potentially trace hyperkeratosis. Mild tenderness to palpation.  She also complains of cramps in her feet. She complained of this a while back and recheck her laboratory work is unremarkable for hypokalemia her hypomagnesemia. We will recheck that today.  The patient surely having a difficult time with her present situation. She reports that she is always sick, but denies any emeses.  She reports that the children at the house sometimes get on her nerves. She reports that she's thought about quitting therapy.  Majority of my visit today was consoling the patient.  Validate her feelings.  She will continue with her current therapy. She is on her 1 week break and she will restart in the near future. She explains that she is going to [place] and this will interfere with her starting her next cycle by 1 day and I am agreeable to  her postponing therapy x1 day if necessary. She is thankful of this.  I provide her education regarding her cancer marker and I've explained her to not be surprised if her cancer marker is elevated next month as this sometimes occurs after starting any therapy.  Oncologically, the patient denies any complaints and ROS questioning is negative.  She does report diarrhea alternating with constipation. This is well-controlled.  We will continue with Stivarga therapy x 3 months total and then perform restaging scans.   Past Medical History  Diagnosis Date  . Hypertension   . Cancer   . Colon cancer 2005    metastatic 2012  . 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 Diabetes mellitus   . HTN (hypertension) 12/22/2011  . Shortness of breath   . GERD (gastroesophageal reflux disease)   . Anxiety     has Metastatic recurrent adenocarcinoma of colon; HTN (hypertension); and Nausea on her problem list.     has No Known Allergies.  [name] does not currently have medications on file.  Past Surgical History  Procedure Laterality Date  . Cholecystectomy  1987  . Colectomy  2005    colon ca  . Abdominal hysterectomy  12/20/2010    met colon ca  . Portacath placement  01/2011  . Cataract extraction w/phaco  08/08/2012    Procedure: CATARACT EXTRACTION PHACO AND INTRAOCULAR LENS PLACEMENT (IOC);  Surgeon: Gemma Payor, MD;  Location: [facility];  Service: Ophthalmology;  Laterality: Right;  CDE:16.18  . Cataract extraction w/phaco  08/26/2012  Procedure: CATARACT EXTRACTION PHACO AND INTRAOCULAR LENS PLACEMENT (IOC);  Surgeon: Gemma Payor, MD;  Location: [facility];  Service: Ophthalmology;  Laterality: Left;  CDE 7.64    Denies any headaches, dizziness, double vision, fevers, chills, night sweats, vomiting, chest pain, heart palpitations, shortness of breath, blood in stool, black tarry stool, urinary pain, urinary burning, urinary frequency, hematuria.   PHYSICAL  EXAMINATION  ECOG PERFORMANCE STATUS: 1 - Symptomatic but completely ambulatory  Filed Vitals:   02/17/13 1400  BP: 202/104  Pulse: 77  Temp: 97.6 F (36.4 C)  Resp: 18    GENERAL:alert, no distress, well nourished, well developed, comfortable, cooperative, obese and smiling SKIN: skin color, texture, turgor are normal, no rashes or significant lesions HEAD: Normocephalic, No masses, lesions, tenderness or abnormalities EYES: normal, Conjunctiva are pink and non-injected EARS: External ears normal OROPHARYNX:mucous membranes are moist  NECK: supple, no adenopathy, thyroid normal size, non-tender, without nodularity, no stridor, non-tender, trachea midline LYMPH:  no palpable lymphadenopathy, no hepatosplenomegaly BREAST:not examined LUNGS: clear to auscultation and percussion HEART: regular rate & rhythm, no murmurs, no gallops, S1 normal and S2 normal ABDOMEN:abdomen soft, non-tender, obese, normal bowel sounds, no masses or organomegaly, liver edge palpated on deep inspiration and no hepatosplenomegaly BACK: Back symmetric, no curvature., No CVA tenderness EXTREMITIES:less then 2 second capillary refill, no joint deformities, effusion, or inflammation, no skin discoloration, no clubbing, no cyanosis, positive findings:  edema trace B/L pedal edema.  Mild tenderness of soles of feet with a right #5 toe lateral hyperkeratosis without any drainage. NEURO: alert & oriented x 3 with fluent speech, no focal motor/sensory deficits   LABORATORY DATA: CBC    Component Value Date/Time   WBC 7.1 02/03/2013 1246   RBC 4.57 02/03/2013 1246   HGB 14.1 02/03/2013 1246   HCT 42.7 02/03/2013 1246   PLT 198 02/03/2013 1246   MCV 93.4 02/03/2013 1246   MCH 30.9 02/03/2013 1246   MCHC 33.0 02/03/2013 1246   RDW 14.1 02/03/2013 1246   LYMPHSABS 2.4 02/03/2013 1246   MONOABS 0.6 02/03/2013 1246   EOSABS 0.1 02/03/2013 1246   BASOSABS 0.1 02/03/2013 1246      Chemistry      Component Value Date/Time   NA 138  02/03/2013 1246   K 4.1 02/03/2013 1246   CL 101 02/03/2013 1246   CO2 27 02/03/2013 1246   BUN 11 02/03/2013 1246   CREATININE 0.79 02/03/2013 1246      Component Value Date/Time   CALCIUM 9.8 02/03/2013 1246   ALKPHOS 62 01/20/2013 1337   AST 28 01/20/2013 1337   ALT 14 01/20/2013 1337   BILITOT 0.3 01/20/2013 1337     Lab Results  Component Value Date   CEA 12.1* 01/20/2013     ASSESSMENT:  1. Metastatic adenocarcinoma of colon (KRAS MUTATION DETECTED) with progression despite  FOLFOX + Avastin followed by FOLFIRI + Avastin.  She was seen by Dr. Lattie Corns at [facility] who recommended Stivarga versus clinical trial.  Patient has opted for treatment with Stivarga prior to enrolling in a study. Now on Stivarga 160 mg PO x 21 days followed by 7 day respite. 2. 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 Constipation 4. Diarrhea 5. Nausea  Patient Active Problem List   Diagnosis Date Noted  . Nausea 12/27/2011  . HTN (hypertension) 12/22/2011  . Metastatic recurrent adenocarcinoma of colon 03/13/2011    PLAN:  1. I personally reviewed and went over laboratory  results with the patient. 2. Labs today: CBC diff, CMET, CEA 3. Labs in every 4 weeks: CBC diff, CMET, CEA 4. Restaging scans in 2 months: CT CAP with contrast. 5. Treatment and anti body plan deleted as this was not done before.  6. Return in 4 weeks for follow-up   THERAPY PLAN: Continue with Stivarga as directed.  We will performed restaging CT scans 3 months following initiation on aforementioned therapy.   All questions were answered. The patient knows to call the clinic with any problems, questions or concerns. We can certainly see the patient much sooner if necessary.  Patient and plan will be discussed with Dr. Mariel Sleet within the next 24 hours.   KEFALAS,THOMAS

## 2013-02-17 ENCOUNTER — Encounter (HOSPITAL_BASED_OUTPATIENT_CLINIC_OR_DEPARTMENT_OTHER): Payer: PRIVATE HEALTH INSURANCE | Admitting: Oncology

## 2013-02-17 ENCOUNTER — Encounter (HOSPITAL_COMMUNITY): Payer: PRIVATE HEALTH INSURANCE

## 2013-02-17 VITALS — BP 202/104 | HR 77 | Temp 97.6°F | Resp 18 | Wt 186.6 lb

## 2013-02-17 DIAGNOSIS — C189 Malignant neoplasm of colon, unspecified: Secondary | ICD-10-CM

## 2013-02-17 LAB — COMPREHENSIVE METABOLIC PANEL
Alkaline Phosphatase: 81 U/L (ref 39–117)
BUN: 15 mg/dL (ref 6–23)
CO2: 27 mEq/L (ref 19–32)
Chloride: 101 mEq/L (ref 96–112)
GFR calc Af Amer: >90 mL/min (ref >90)
GFR calc non Af Amer: 89 mL/min — ABNORMAL LOW (ref >90)
Glucose, Bld: 133 mg/dL — ABNORMAL HIGH (ref 70–99)
Potassium: 3.8 mEq/L (ref 3.5–5.1)
Total Bilirubin: 0.5 mg/dL (ref 0.3–1.2)

## 2013-02-17 LAB — CBC WITH DIFFERENTIAL/PLATELET
Basophils Absolute: 0 10*3/uL (ref 0.0–0.1)
Basophils Relative: 1 % (ref 0–1)
Eosinophils Absolute: 0.1 10*3/uL (ref 0.0–0.7)
Eosinophils Relative: 2 % (ref 0–5)
Lymphs Abs: 2.4 10*3/uL (ref 0.7–4.0)
MCH: 30.6 pg (ref 26.0–34.0)
MCHC: 33.3 g/dL (ref 30.0–36.0)
MCV: 92.2 fL (ref 78.0–100.0)
Platelets: 159 10*3/uL (ref 150–400)
RDW: 14.1 % (ref 11.5–15.5)

## 2013-02-17 MED ORDER — HEPARIN SOD (PORK) LOCK FLUSH 100 UNIT/ML IV SOLN
INTRAVENOUS | Status: AC
  Filled 2013-02-17: qty 5

## 2013-03-08 ENCOUNTER — Emergency Department (HOSPITAL_COMMUNITY)
Admission: EM | Admit: 2013-03-08 | Discharge: 2013-03-09 | Disposition: A | Discharge status: Home / Self Care | Payer: PRIVATE HEALTH INSURANCE | Attending: Emergency Medicine | Admitting: Emergency Medicine

## 2013-03-08 ENCOUNTER — Emergency Department (HOSPITAL_COMMUNITY): Payer: PRIVATE HEALTH INSURANCE

## 2013-03-08 ENCOUNTER — Encounter (HOSPITAL_COMMUNITY): Payer: Self-pay | Admitting: *Deleted

## 2013-03-08 DIAGNOSIS — R6883 Chills (without fever): Secondary | ICD-10-CM | POA: Insufficient documentation

## 2013-03-08 DIAGNOSIS — K219 Gastro-esophageal reflux disease without esophagitis: Secondary | ICD-10-CM | POA: Insufficient documentation

## 2013-03-08 DIAGNOSIS — IMO0001 Reserved for inherently not codable concepts without codable children: Secondary | ICD-10-CM | POA: Insufficient documentation

## 2013-03-08 DIAGNOSIS — Z79899 Other long term (current) drug therapy: Secondary | ICD-10-CM | POA: Insufficient documentation

## 2013-03-08 DIAGNOSIS — R112 Nausea with vomiting, unspecified: Secondary | ICD-10-CM

## 2013-03-08 DIAGNOSIS — R197 Diarrhea, unspecified: Secondary | ICD-10-CM | POA: Insufficient documentation

## 2013-03-08 DIAGNOSIS — F411 Generalized anxiety disorder: Secondary | ICD-10-CM | POA: Insufficient documentation

## 2013-03-08 DIAGNOSIS — I10 Essential (primary) hypertension: Secondary | ICD-10-CM | POA: Insufficient documentation

## 2013-03-08 DIAGNOSIS — R109 Unspecified abdominal pain: Secondary | ICD-10-CM

## 2013-03-08 DIAGNOSIS — R0602 Shortness of breath: Secondary | ICD-10-CM | POA: Insufficient documentation

## 2013-03-08 DIAGNOSIS — J3489 Other specified disorders of nose and nasal sinuses: Secondary | ICD-10-CM | POA: Insufficient documentation

## 2013-03-08 DIAGNOSIS — R51 Headache: Secondary | ICD-10-CM | POA: Insufficient documentation

## 2013-03-08 DIAGNOSIS — E119 Type 2 diabetes mellitus without complications: Secondary | ICD-10-CM | POA: Insufficient documentation

## 2013-03-08 DIAGNOSIS — C189 Malignant neoplasm of colon, unspecified: Secondary | ICD-10-CM

## 2013-03-08 DIAGNOSIS — Z7982 Long term (current) use of aspirin: Secondary | ICD-10-CM | POA: Insufficient documentation

## 2013-03-08 LAB — CBC WITH DIFFERENTIAL/PLATELET
HCT: 40.6 % (ref 36.0–46.0)
Hemoglobin: 13.7 g/dL (ref 12.0–15.0)
Lymphocytes Relative: 9 % — ABNORMAL LOW (ref 12–46)
Lymphs Abs: 1 10*3/uL (ref 0.7–4.0)
Monocytes Absolute: 0.7 10*3/uL (ref 0.1–1.0)
Monocytes Relative: 6 % (ref 3–12)
Neutro Abs: 10.1 10*3/uL — ABNORMAL HIGH (ref 1.7–7.7)
Neutrophils Relative %: 85 % — ABNORMAL HIGH (ref 43–77)
RBC: 4.47 MIL/uL (ref 3.87–5.11)

## 2013-03-08 LAB — COMPREHENSIVE METABOLIC PANEL
ALT: 111 U/L — ABNORMAL HIGH (ref 0–35)
CO2: 23 mEq/L (ref 19–32)
Calcium: 9.5 mg/dL (ref 8.4–10.5)
Creatinine, Ser: 0.55 mg/dL (ref 0.50–1.10)
GFR calc Af Amer: >90 mL/min (ref >90)
GFR calc non Af Amer: >90 mL/min (ref >90)
Glucose, Bld: 187 mg/dL — ABNORMAL HIGH (ref 70–99)
Sodium: 134 mEq/L — ABNORMAL LOW (ref 135–145)
Total Protein: 7.5 g/dL (ref 6.0–8.3)

## 2013-03-08 LAB — LIPASE, BLOOD: Lipase: 23 U/L (ref 11–59)

## 2013-03-08 MED ORDER — HYDROMORPHONE HCL PF 1 MG/ML IJ SOLN
1.0000 mg | Freq: Once | INTRAMUSCULAR | Status: AC
  Administered 2013-03-08: 1 mg via INTRAVENOUS
  Filled 2013-03-08: qty 1

## 2013-03-08 MED ORDER — SODIUM CHLORIDE 0.9 % IV SOLN: INTRAVENOUS | Status: DC

## 2013-03-08 MED ORDER — IOHEXOL 300 MG/ML  SOLN
50.0000 mL | Freq: Once | INTRAMUSCULAR | Status: AC | PRN
  Administered 2013-03-08: 50 mL via ORAL

## 2013-03-08 MED ORDER — SODIUM CHLORIDE 0.9 % IV BOLUS (SEPSIS)
1000.0000 mL | Freq: Once | INTRAVENOUS | Status: AC
  Administered 2013-03-08: 1000 mL via INTRAVENOUS

## 2013-03-08 MED ORDER — ONDANSETRON HCL 4 MG/2ML IJ SOLN
4.0000 mg | Freq: Once | INTRAMUSCULAR | Status: AC
  Administered 2013-03-08: 4 mg via INTRAVENOUS
  Filled 2013-03-08: qty 2

## 2013-03-08 MED ORDER — SODIUM CHLORIDE 0.9 % IV BOLUS (SEPSIS): 500.0000 mL | Freq: Once | INTRAVENOUS | Status: DC

## 2013-03-08 MED ORDER — IOHEXOL 300 MG/ML  SOLN
100.0000 mL | Freq: Once | INTRAMUSCULAR | Status: AC | PRN
  Administered 2013-03-08: 100 mL via INTRAVENOUS

## 2013-03-08 NOTE — ED Notes (Addendum)
Pt reporting vomiting and diarrhea for past several hours, and upper abdominal cramping.   Reports starting new chemo medications a couple months ago and believes it may be related..  Vomited once in triage.

## 2013-03-08 NOTE — ED Notes (Signed)
Patient given a bag of ice to numb port site for access.

## 2013-03-08 NOTE — ED Notes (Signed)
Accessed pts power port

## 2013-03-08 NOTE — ED Provider Notes (Signed)
History  This chart was scribed for Shelda Jakes, MD by Ardelia Mems, ED Scribe. This patient was seen in room [facility] and the patient's care was started at 9:44 PM.  CSN: [id number]  Arrival date & time 03/08/13  2129   No chief complaint on file.   The history is provided by the patient. No language interpreter was used.   HPI Comments: Yolanda Friedman is a 62 y.o. female with a hx of recurrent colon cancer, DM and HTN who presents to the Emergency Department complaining of constant, severe "9-10/10" abdominal pain described as cramping with multiple episodes of associated emesis and diarrhea onset about 7 hours ago. Pt denies presence of blood in either diarrhea or emesis. Pt states that her abdominal cramping radiates to her lower back and sides. Pt's most recent episode of emesis was in triage. She states that she has had an episode of emesis every 15 minutes for the past 7 hours. Pt reports starting new chemotherapy medication a couple months ago and believes her current symptoms may be related to this. Pt denies recent sick contacts. She states that she is not on Coumadin or any blood thinners. She denies fever, SOB, chest pain, dysuria or any other symptoms. She is an occasional alcohol user and denies any hx of smoking.  Pt sees Hematology/Oncology Clinic   Past Medical History  Diagnosis Date  . Hypertension   . Cancer   . Colon cancer 2005    metastatic 2012  . 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 Diabetes mellitus   . HTN (hypertension) 12/22/2011  . Shortness of breath   . GERD (gastroesophageal reflux disease)   . Anxiety    Past Surgical History  Procedure Laterality Date  . Cholecystectomy  1987  . Colectomy  2005    colon ca  . Abdominal hysterectomy  12/20/2010    met colon ca  . Portacath placement  01/2011  . Cataract extraction w/phaco  08/08/2012    Procedure: CATARACT EXTRACTION PHACO AND INTRAOCULAR LENS PLACEMENT (IOC);   Surgeon: Gemma Payor, MD;  Location: [facility];  Service: Ophthalmology;  Laterality: Right;  CDE:16.18  . Cataract extraction w/phaco  08/26/2012    Procedure: CATARACT EXTRACTION PHACO AND INTRAOCULAR LENS PLACEMENT (IOC);  Surgeon: Gemma Payor, MD;  Location: [facility];  Service: Ophthalmology;  Laterality: Left;  CDE 7.64   Family History  Problem Relation Age of Onset  . Cancer Mother   . Hypertension Sister   . Diabetes Sister   . Hypertension Maternal Uncle    History  Substance Use Topics  . Smoking status: Never Smoker   . Smokeless tobacco: Never Used  . Alcohol Use: Yes     Comment: occassional   OB History   Grav Para Term Preterm Abortions TAB SAB Ect Mult Living                 Review of Systems  Constitutional: Positive for chills. Negative for fever.  HENT: Positive for rhinorrhea. Negative for congestion and sore throat.   Eyes: Negative for visual disturbance.  Respiratory: Negative for cough and shortness of breath.   Cardiovascular: Negative for chest pain and leg swelling.  Gastrointestinal: Positive for nausea, vomiting, abdominal pain and diarrhea.  Genitourinary: Negative for dysuria and hematuria.  Musculoskeletal: Positive for myalgias.  Skin: Negative for rash.  Neurological: Positive for headaches.  Hematological: Does not bruise/bleed easily.  Psychiatric/Behavioral: Negative for confusion.  A complete 10 system review of systems  was obtained and all systems are negative except as noted in the HPI and PMH.   Allergies  Review of patient's allergies indicates no known allergies.  Home Medications   Current Outpatient Rx  Name  Route  Sig  Dispense  Refill  . albuterol (PROVENTIL HFA;VENTOLIN HFA) 108 (90 BASE) MCG/ACT inhaler   Inhalation   Inhale 2 puffs into the lungs every 6 (six) hours as needed for wheezing.   1 Inhaler   2   . Alum & Mag Hydroxide-Simeth (MAGIC MOUTHWASH) SOLN   Oral   Take 5 mLs by mouth 4 (four) times daily.   240 mL    1   . amLODipine-valsartan (EXFORGE) 5-160 MG per tablet   Oral   Take 1 tablet by mouth daily.   30 tablet   2   . aspirin 81 MG tablet   Oral   Take 81 mg by mouth daily.           Marland Kitchen HYDROcodone-acetaminophen (NORCO) 10-325 MG per tablet   Oral   Take 1 tablet by mouth every 6 (six) hours as needed. FOR PAIN   50 tablet   1   . LORazepam (ATIVAN) 1 MG tablet   Oral   Take 1 tablet (1 mg total) by mouth every 4 (four) hours as needed (for nausea and vomiting). Nausea/vomiting   30 tablet   2   . EXPIRED: metFORMIN (GLUCOPHAGE) 500 MG tablet   Oral   Take 1 tablet (500 mg total) by mouth 2 (two) times daily with a meal.   60 tablet   2   . Multiple Vitamins-Minerals (ONE-A-DAY 50 PLUS PO)   Oral   Take 1 tablet by mouth daily.          . nabumetone (RELAFEN) 750 MG tablet   Oral   Take 750 mg by mouth 2 (two) times daily.         Marland Kitchen omeprazole (PRILOSEC) 20 MG capsule   Oral   Take 1 capsule (20 mg total) by mouth 2 (two) times daily.   60 capsule   1   . ondansetron (ZOFRAN) 8 MG tablet   Oral   Take 8 mg by mouth 2 (two) times daily as needed for nausea (vomiting).         . Oral Wound Care Products (GELX) GEL   Mouth/Throat   Use as directed 15 mLs in the mouth or throat 3 (three) times daily as needed (mouth pain).   100 mL   0     Sample   . potassium chloride (K-DUR) 10 MEQ tablet   Oral   Take 2 tablets (20 mEq total) by mouth daily.   60 tablet   2   . regorafenib (STIVARGA) 40 MG tablet      Take with low fat meal. Caution: Chemotherapy.Take 4 a day for 21 days then off 7 days and repeat   84 tablet   2   . temazepam (RESTORIL) 15 MG capsule      Take 1 or 2 capsules as needed for sleep nightly   60 capsule   1    Triage Vitals: BP 190/87  Pulse 99  Temp(Src) 98 F (36.7 C) (Oral)  Resp 18  Ht 5' (1.524 m)  Wt 182 lb (82.555 kg)  BMI 35.54 kg/m2  SpO2 100%  Physical Exam  Nursing note and vitals  reviewed. Constitutional: She is oriented to person, place, and time. She appears well-developed and  well-nourished.  HENT:  Head: Normocephalic and atraumatic.  Mouth/Throat: Oropharynx is clear and moist.  Eyes: EOM are normal. Pupils are equal, round, and reactive to light.  Neck: Normal range of motion. Neck supple.  Cardiovascular: Normal rate, regular rhythm and normal heart sounds.   No murmur heard. No swelling in ankles.  Pulmonary/Chest: Effort normal and breath sounds normal. No respiratory distress. She has no wheezes.  Abdominal: Soft. There is tenderness.  Mild tenderness to palpation in RUQ, without guarding. Bowel sounds slightly decreased.  Genitourinary:  Mild tenderness to palpation of left CVA area.  Musculoskeletal: Normal range of motion. She exhibits no tenderness.  Neurological: She is alert and oriented to person, place, and time.  Skin: Skin is warm and dry. No rash noted.  Psychiatric: She has a normal mood and affect.    ED Course  Procedures (including critical care time)  DIAGNOSTIC STUDIES: Oxygen Saturation is 100% on RA, normal by my interpretation.    COORDINATION OF CARE: 10:03 PM- Pt advised of plan for treatment and pt agrees.  Medications  0.9 %  sodium chloride infusion (not administered)  iohexol (OMNIPAQUE) 300 MG/ML solution 50 mL (not administered)  iohexol (OMNIPAQUE) 300 MG/ML solution 100 mL (not administered)  ondansetron (ZOFRAN) injection 4 mg (4 mg Intravenous Given 03/08/13 2300)  HYDROmorphone (DILAUDID) injection 1 mg (1 mg Intravenous Given 03/08/13 2300)  sodium chloride 0.9 % bolus 1,000 mL (1,000 mLs Intravenous New Bag/Given 03/08/13 2259)    Labs Reviewed  LIPASE, BLOOD  COMPREHENSIVE METABOLIC PANEL  CBC WITH DIFFERENTIAL    No results found.  1. Abdominal pain   2. Nausea vomiting and diarrhea   3. Colonic cancer     MDM  Patient with history of metastatic colon cancer. On oral chemotherapy followed by heme  clinic upstairs. Patient with acute onset of nausea vomiting and diarrhea and some body aches may be viral but due to her multiple surgeries in her diagnosis need to make sure there is no bowel obstruction. CT abdomen and pelvis is pending lab results are pending. Patient turned over to nighttime emergency physician Dr. Colon Branch.       I personally performed the services described in this documentation, which was scribed in my presence. The recorded information has been reviewed and is accurate.     Shelda Jakes, MD 03/08/13 [phone]

## 2013-03-09 MED ORDER — POTASSIUM CHLORIDE CRYS ER 20 MEQ PO TBCR
60.0000 meq | EXTENDED_RELEASE_TABLET | Freq: Once | ORAL | Status: AC
  Administered 2013-03-09: 60 meq via ORAL
  Filled 2013-03-09: qty 3

## 2013-03-09 MED ORDER — HEPARIN SOD (PORK) LOCK FLUSH 100 UNIT/ML IV SOLN
INTRAVENOUS | Status: AC
  Administered 2013-03-09: 5 [IU]
  Filled 2013-03-09: qty 5

## 2013-03-09 NOTE — ED Provider Notes (Signed)
2320 Assumed care/dispostion of patient. Patient with metastatic colon cancer here with nausea, vomiting and diarrhea. She is to have labs and CT of the abdomen. 1610 Labs and CT have returned.No evidence of obstruction. Continued intrapelvic metastases suspected. Labs with slightly low potassium of 3.2. Given potassium. Reviewed results with the patient. Nausea is better. She is ready for discharge. Pt stable in ED with no significant deterioration in condition.The patient appears reasonably screened and/or stabilized for discharge and I doubt any other medical condition or other [facility] requiring further screening, evaluation, or treatment in the ED at this time prior to discharge.  Ct Abdomen Pelvis W Contrast  03/09/2013   *RADIOLOGY REPORT*  Clinical Data:  Nausea, vomiting, diarrhea, history metastatic colon cancer, hypertension, diabetes, GERD  CT ABDOMEN AND PELVIS WITH CONTRAST  Technique:  Multidetector CT imaging of the abdomen and pelvis was performed following the standard protocol during bolus administration of intravenous contrast. Sagittal and coronal MPR images reconstructed from axial data set.  Contrast: 50mL OMNIPAQUE IOHEXOL 300 MG/ML  SOLN, OMNIPAQUE IOHEXOL 300 MG/ML  SOLN  Comparison: 12/31/2012  Findings: Lung bases clear. Scattered atherosclerotic calcifications aorta and coronary arteries. Gallbladder and uterus surgically absent with nonvisualization of ovaries. Post right hemicolectomy. Liver, spleen, pancreas, kidneys, and adrenal glands normal.  Soft tissue masses calcifications at lesser sac, 4.1 x 2.6 x 4.2 cm little changed. Nodular area again identified along the intra abdominal wall the site of prior laparotomy scar. These include areas of thickening extending towards right rectus muscle measuring 3.2 x 1.5 cm image 28 and at the midline 2.1 x 1.4 cm. Question additional anterior abdominal wall nodularity more inferiorly at the anterior pelvis though this is less well  defined. Additional soft tissue nodule at the ventral scar 14 x 9 mm image 59.  Retroperitoneal tumor again identified including a 3.4 x 1.9 cm diameter mass adjacent to the right iliopsoas image 53, anterior to the iliac bifurcation 2.3 x 0.5 cm, and question of sigmoid mesocolon. Soft tissue nodularity more inferiorly in pelvis just above the vaginal cough again identified, little changed, with question soft tissue infiltration in the mid pelvis at the sigmoid mesocolon as well. No intraperitoneal free air free fluid. No definite osseous metastases identified.  IMPRESSION: Persistent visualization of retroperitoneal tumor as well as calcified peritoneal metastatic disease at the lesser sac. Additional intrapelvic metastases suspected as well as suspected tumor implants along the anterior abdominal wall and at the ventral surgical scar.   Original Report Authenticated By: Ulyses Southward, M.D.    Results for orders placed during the hospital encounter of 03/08/13  LIPASE, BLOOD      Result Value Range   Lipase 23  11 - 59 U/L  COMPREHENSIVE METABOLIC PANEL      Result Value Range   Sodium 134 (*) 135 - 145 mEq/L   Potassium 3.2 (*) 3.5 - 5.1 mEq/L   Chloride 100  96 - 112 mEq/L   CO2 23  19 - 32 mEq/L   Glucose, Bld 187 (*) 70 - 99 mg/dL   BUN 12  6 - 23 mg/dL   Creatinine, Ser 9.60  0.50 - 1.10 mg/dL   Calcium 9.5  8.4 - 45.4 mg/dL   Total Protein 7.5  6.0 - 8.3 g/dL   Albumin 3.7  3.5 - 5.2 g/dL   AST 098 (*) 0 - 37 U/L   ALT 111 (*) 0 - 35 U/L   Alkaline Phosphatase 85  39 - 117 U/L  Total Bilirubin 0.7  0.3 - 1.2 mg/dL   GFR calc non Af Amer >90  >90 mL/min   GFR calc Af Amer >90  >90 mL/min  CBC WITH DIFFERENTIAL      Result Value Range   WBC 11.9 (*) 4.0 - 10.5 K/uL   RBC 4.47  3.87 - 5.11 MIL/uL   Hemoglobin 13.7  12.0 - 15.0 g/dL   HCT 91.4  78.2 - 95.6 %   MCV 90.8  78.0 - 100.0 fL   MCH 30.6  26.0 - 34.0 pg   MCHC 33.7  30.0 - 36.0 g/dL   RDW 21.3  08.6 - 57.8 %   Platelets  169  150 - 400 K/uL   Neutrophils Relative % 85 (*) 43 - 77 %   Neutro Abs 10.1 (*) 1.7 - 7.7 K/uL   Lymphocytes Relative 9 (*) 12 - 46 %   Lymphs Abs 1.0  0.7 - 4.0 K/uL   Monocytes Relative 6  3 - 12 %   Monocytes Absolute 0.7  0.1 - 1.0 K/uL   Eosinophils Relative 0  0 - 5 %   Eosinophils Absolute 0.0  0.0 - 0.7 K/uL   Basophils Relative 0  0 - 1 %   Basophils Absolute 0.0  0.0 - 0.1 K/uL    Nicoletta Dress. Colon Branch, MD 03/09/13 4696

## 2013-03-10 ENCOUNTER — Other Ambulatory Visit (HOSPITAL_COMMUNITY): Payer: Self-pay | Admitting: Oncology

## 2013-03-10 DIAGNOSIS — K219 Gastro-esophageal reflux disease without esophagitis: Secondary | ICD-10-CM

## 2013-03-10 DIAGNOSIS — R110 Nausea: Secondary | ICD-10-CM

## 2013-03-10 DIAGNOSIS — C189 Malignant neoplasm of colon, unspecified: Secondary | ICD-10-CM

## 2013-03-10 MED ORDER — OMEPRAZOLE 20 MG PO CPDR: 20.0000 mg | DELAYED_RELEASE_CAPSULE | Freq: Two times a day (BID) | ORAL | Status: DC

## 2013-03-10 MED ORDER — HYDROCODONE-ACETAMINOPHEN 10-325 MG PO TABS: 1.0000 | ORAL_TABLET | Freq: Four times a day (QID) | ORAL | Status: DC | PRN

## 2013-03-10 MED ORDER — POTASSIUM CHLORIDE ER 10 MEQ PO TBCR: 20.0000 meq | EXTENDED_RELEASE_TABLET | Freq: Every day | ORAL | Status: DC

## 2013-03-10 MED ORDER — ONDANSETRON HCL 8 MG PO TABS: 8.0000 mg | ORAL_TABLET | Freq: Two times a day (BID) | ORAL | Status: DC | PRN

## 2013-03-13 ENCOUNTER — Telehealth (HOSPITAL_COMMUNITY): Payer: Self-pay | Admitting: *Deleted

## 2013-03-13 NOTE — Telephone Encounter (Signed)
Patient called to let me know that she came to the ER 2 days ago for abdominal pain, "spitting up yellow", and dehydration. Patient said that her skin color is changing. Her skin color is getting lighter in her hands/face. She has light spots on her arm, hands, leg coming up. Palms of hands are really pink in color. Pt is on Stivarga. Pt taking Zofran if needed. She is having more nausea than she used to have.

## 2013-03-13 NOTE — Telephone Encounter (Signed)
Do her hands/feet hurt?  Let's get her on a provider's schedule when there is an opening. What is she taking for anti-nausea?

## 2013-03-13 NOTE — Telephone Encounter (Signed)
Nevermind, already scheduled for Monday.  Let's work on antiemetic regimen.  What is she taking?

## 2013-03-14 ENCOUNTER — Encounter (HOSPITAL_COMMUNITY): Payer: PRIVATE HEALTH INSURANCE | Attending: Oncology

## 2013-03-14 DIAGNOSIS — C189 Malignant neoplasm of colon, unspecified: Secondary | ICD-10-CM | POA: Insufficient documentation

## 2013-03-14 LAB — CBC WITH DIFFERENTIAL/PLATELET
Basophils Absolute: 0 10*3/uL (ref 0.0–0.1)
Basophils Relative: 0 % (ref 0–1)
Eosinophils Relative: 1 % (ref 0–5)
HCT: 39.5 % (ref 36.0–46.0)
MCHC: 33.2 g/dL (ref 30.0–36.0)
Monocytes Absolute: 0.6 10*3/uL (ref 0.1–1.0)
Neutro Abs: 4.5 10*3/uL (ref 1.7–7.7)
Platelets: 192 10*3/uL (ref 150–400)
RDW: 14.4 % (ref 11.5–15.5)

## 2013-03-14 LAB — COMPREHENSIVE METABOLIC PANEL
ALT: 54 U/L — ABNORMAL HIGH (ref 0–35)
Alkaline Phosphatase: 75 U/L (ref 39–117)
BUN: 12 mg/dL (ref 6–23)
CO2: 27 mEq/L (ref 19–32)
Chloride: 101 mEq/L (ref 96–112)
GFR calc Af Amer: >90 mL/min (ref >90)
Glucose, Bld: 99 mg/dL (ref 70–99)
Potassium: 3.8 mEq/L (ref 3.5–5.1)
Sodium: 136 mEq/L (ref 135–145)
Total Bilirubin: 0.6 mg/dL (ref 0.3–1.2)
Total Protein: 6.9 g/dL (ref 6.0–8.3)

## 2013-03-14 MED ORDER — HEPARIN SOD (PORK) LOCK FLUSH 100 UNIT/ML IV SOLN
500.0000 [IU] | Freq: Once | INTRAVENOUS | Status: AC
  Administered 2013-03-14: 500 [IU] via INTRAVENOUS
  Filled 2013-03-14: qty 5

## 2013-03-14 MED ORDER — HEPARIN SOD (PORK) LOCK FLUSH 100 UNIT/ML IV SOLN
INTRAVENOUS | Status: AC
  Filled 2013-03-14: qty 5

## 2013-03-14 NOTE — Progress Notes (Signed)
Yolanda Friedman presented for Portacath access and flush. Proper placement of portacath confirmed by CXR. Portacath located rt chest wall accessed with  H 20 needle. Good blood return present. Portacath flushed with 20ml NS and 500U/5ml Heparin and needle removed intact. Procedure without incident. Patient tolerated procedure well.   

## 2013-03-17 ENCOUNTER — Encounter (HOSPITAL_BASED_OUTPATIENT_CLINIC_OR_DEPARTMENT_OTHER): Payer: PRIVATE HEALTH INSURANCE

## 2013-03-17 ENCOUNTER — Other Ambulatory Visit (HOSPITAL_COMMUNITY): Payer: Medicare Other

## 2013-03-17 ENCOUNTER — Ambulatory Visit (HOSPITAL_COMMUNITY): Payer: Self-pay

## 2013-03-17 VITALS — BP 160/103 | HR 88 | Temp 98.2°F | Resp 20 | Wt 177.6 lb

## 2013-03-17 DIAGNOSIS — L271 Localized skin eruption due to drugs and medicaments taken internally: Secondary | ICD-10-CM

## 2013-03-17 DIAGNOSIS — C189 Malignant neoplasm of colon, unspecified: Secondary | ICD-10-CM

## 2013-03-17 DIAGNOSIS — L270 Generalized skin eruption due to drugs and medicaments taken internally: Secondary | ICD-10-CM

## 2013-03-17 NOTE — Patient Instructions (Addendum)
[facility] Discharge Instructions  RECOMMENDATIONS MADE BY THE CONSULTANT AND ANY TEST RESULTS WILL BE SENT TO YOUR REFERRING PHYSICIAN.  Continue Stivarga for another month. Keep appointment for port flush and labs in August. MD appointment after next lab work.  Thank you for choosing [facility] to provide your oncology and hematology care.  To afford each patient quality time with our providers, please arrive at least 15 minutes before your scheduled appointment time.  With your help, our goal is to use those 15 minutes to complete the necessary work-up to ensure our physicians have the information they need to help with your evaluation and healthcare recommendations.    Effective January 1st, 2014, we ask that you re-schedule your appointment with our physicians should you arrive 10 or more minutes late for your appointment.  We strive to give you quality time with our providers, and arriving late affects you and other patients whose appointments are after yours.    Again, thank you for choosing [facility].  Our hope is that these requests will decrease the amount of time that you wait before being seen by our physicians.       _____________________________________________________________  Should you have questions after your visit to [facility], please contact our office at [phone] between the hours of 8:30 a.m. and 5:00 p.m.  Voicemails left after 4:30 p.m. will not be returned until the following business day.  For prescription refill requests, have your pharmacy contact our office with your prescription refill request.

## 2013-03-17 NOTE — Progress Notes (Addendum)
[facility] Telephone:([phone]) [phone]   [phone]  OFFICE PROGRESS NOTE  [name], NP No address on file  DIAGNOSIS:  Metastatic colon cancer   ONCOLOGIC HISTORY: Per note by Dellis Anes PA-C metastatic adenocarcinoma of colon (KRAS MUTATION DETECTED) with progression despite FOLFOX + Avastin followed by FOLFIRI + Avastin. She was seen by Dr. Lattie Corns at [facility] who recommended Stivarga versus clinical trial. Patient has opted for treatment with Stivarga prior to enrolling in a study. Stivarga (Regorafenib) 160 mg PO daily x 21 days with 7 day respite. Started ~01/27/2013, CEA on 01/20/2013 was 12.1.    INTERVAL HISTORY:   Yolanda Friedman 62 y.o. female returns to the clinic today for scheduled follow up.   She was recently seen emergency room on 03/08/2013 for  abdomen  pain nausea , vomiting and diarrhea.Into the ER of Dr. Vanetta Mulders Discharge note:[id number] Assumed care/disposition of patient. Patient with metastatic colon cancer here with nausea, vomiting and diarrhea. She is to have labs and CT of the abdomen. 4540 Labs and CT have returned.No evidence of obstruction. Continued intrapelvic metastases suspected. Labs with slightly low potassium of 3.2. Given potassium. Reviewed results with the patient. Nausea is better. She is ready for discharge. Pt stable in ED with no significant deterioration in condition.The patient appears reasonably screened and/or stabilized for discharge and I doubt any other medical condition or other [facility] requiring further screening, evaluation, or treatment in the ED at this time prior to discharge .  She is today accompanied by her son she tells me she feels well, denies any significant pain in her hands and feet and states the darkening in the hands are mostly gone, improving in a soles of the feet.  She denies any change in shortness of breath, she tells me she has mild back and abdominal pain.  She also  states that she has mild nausea but she drinks water and down spits out water but no significant  vomiting. She denies any fever or chills.  No diarrhea or constipation at this time. She is reportedly taking Sivarga as recommended. She is at about 9 pounds weight loss since her last visit her reports the decreased appetite.   MEDICAL HISTORY: Past Medical History  Diagnosis Date  . Hypertension   . Cancer   . Colon cancer 2005    metastatic 2012  . 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 Diabetes mellitus   . HTN (hypertension) 12/22/2011  . Shortness of breath   . GERD (gastroesophageal reflux disease)   . Anxiety     ALLERGIES:  has No Known Allergies.  MEDICATIONS:  Current Outpatient Prescriptions  Medication Sig Dispense Refill  . albuterol (PROVENTIL HFA;VENTOLIN HFA) 108 (90 BASE) MCG/ACT inhaler Inhale 2 puffs into the lungs every 6 (six) hours as needed for wheezing.  1 Inhaler  2  . Alum & Mag Hydroxide-Simeth (MAGIC MOUTHWASH) SOLN Take 5 mLs by mouth 4 (four) times daily.  240 mL  1  . amLODipine-valsartan (EXFORGE) 5-160 MG per tablet Take 1 tablet by mouth daily.  30 tablet  2  . aspirin 81 MG tablet Take 81 mg by mouth daily.        Marland Kitchen HYDROcodone-acetaminophen (NORCO) 10-325 MG per tablet Take 1 tablet by mouth every 6 (six) hours as needed. FOR PAIN  50 tablet  1  . LORazepam (ATIVAN) 1 MG tablet Take 1 tablet (1 mg total) by mouth every  4 (four) hours as needed (for nausea and vomiting). Nausea/vomiting  30 tablet  2  . Multiple Vitamins-Minerals (ONE-A-DAY 50 PLUS PO) Take 1 tablet by mouth daily.       . nabumetone (RELAFEN) 750 MG tablet Take 750 mg by mouth 2 (two) times daily.      Marland Kitchen omeprazole (PRILOSEC) 20 MG capsule Take 1 capsule (20 mg total) by mouth 2 (two) times daily.  60 capsule  1  . ondansetron (ZOFRAN) 8 MG tablet Take 1 tablet (8 mg total) by mouth 2 (two) times daily as needed for nausea (vomiting).  40 tablet  1  . Oral Wound  Care Products (GELX) GEL Use as directed 15 mLs in the mouth or throat 3 (three) times daily as needed (mouth pain).  100 mL  0  . potassium chloride (K-DUR) 10 MEQ tablet Take 2 tablets (20 mEq total) by mouth daily.  60 tablet  2  . regorafenib (STIVARGA) 40 MG tablet Take with low fat meal. Caution: Chemotherapy.Take 4 a day for 21 days then off 7 days and repeat  84 tablet  2  . temazepam (RESTORIL) 15 MG capsule Take 1 or 2 capsules as needed for sleep nightly  60 capsule  1  . metFORMIN (GLUCOPHAGE) 500 MG tablet Take 1 tablet (500 mg total) by mouth 2 (two) times daily with a meal.  60 tablet  2   No current facility-administered medications for this visit.    SURGICAL HISTORY:  Past Surgical History  Procedure Laterality Date  . Cholecystectomy  1987  . Colectomy  2005    colon ca  . Abdominal hysterectomy  12/20/2010    met colon ca  . Portacath placement  01/2011  . Cataract extraction w/phaco  08/08/2012    Procedure: CATARACT EXTRACTION PHACO AND INTRAOCULAR LENS PLACEMENT (IOC);  Surgeon: Gemma Payor, MD;  Location: [facility];  Service: Ophthalmology;  Laterality: Right;  CDE:16.18  . Cataract extraction w/phaco  08/26/2012    Procedure: CATARACT EXTRACTION PHACO AND INTRAOCULAR LENS PLACEMENT (IOC);  Surgeon: Gemma Payor, MD;  Location: [facility];  Service: Ophthalmology;  Laterality: Left;  CDE 7.64     REVIEW OF SYSTEMS: 14 point review of system is as in the history above otherwise negative.    PHYSICAL EXAMINATION:  Blood pressure 160/103, pulse 88, temperature 98.2 F (36.8 C), temperature source Oral, resp. rate 20, weight 177 lb 9.6 oz (80.559 kg). GENERAL: No distress. SKIN:  No rashes or significant lesions  HEAD: Normocephalic, No masses, lesions, tenderness or abnormalities  EYES: Conjunctiva are pink and non-injected  ENT: External ears normal ,lips, buccal mucosa, and tongue normal and mucous membranes are moist .  No evidence of mucositis LYMPH: No palpable  lymphadenopathy,in a neck of supraclavicular areas. LUNGS: clear to auscultation , no crackles or wheezes HEART: regular rate & rhythm, no murmurs, no gallops, S1 normal and S2 normal  ABDOMEN: Abdomen soft, non-tender, normal bowel sounds, no masses or organomegaly and no hepatosplenomegaly palpable.  Old healed surgical scars noted. MSK: No CVA tenderness and no tenderness on percussion of the back or rib cage. EXTREMITIES: No edema, no skin discoloration or tenderness NEURO: alert & oriented , no focal motor/sensory deficits.     LABORATORY DATA: Lab Results  Component Value Date   WBC 7.4 03/14/2013   HGB 13.1 03/14/2013   HCT 39.5 03/14/2013   MCV 91.6 03/14/2013   PLT 192 03/14/2013      Chemistry  Component Value Date/Time   NA 136 03/14/2013 1520   K 3.8 03/14/2013 1520   CL 101 03/14/2013 1520   CO2 27 03/14/2013 1520   BUN 12 03/14/2013 1520   CREATININE 0.66 03/14/2013 1520      Component Value Date/Time   CALCIUM 9.2 03/14/2013 1520   ALKPHOS 75 03/14/2013 1520   AST 52* 03/14/2013 1520   ALT 54* 03/14/2013 1520   BILITOT 0.6 03/14/2013 1520       RADIOGRAPHIC STUDIES: Ct Abdomen Pelvis W Contrast  03/09/2013   *RADIOLOGY REPORT*  Clinical Data:  Nausea, vomiting, diarrhea, history metastatic colon cancer, hypertension, diabetes, GERD  CT ABDOMEN AND PELVIS WITH CONTRAST  Technique:  Multidetector CT imaging of the abdomen and pelvis was performed following the standard protocol during bolus administration of intravenous contrast. Sagittal and coronal MPR images reconstructed from axial data set.  Contrast: 50mL OMNIPAQUE IOHEXOL 300 MG/ML  SOLN, OMNIPAQUE IOHEXOL 300 MG/ML  SOLN  Comparison: 12/31/2012  Findings: Lung bases clear. Scattered atherosclerotic calcifications aorta and coronary arteries. Gallbladder and uterus surgically absent with nonvisualization of ovaries. Post right hemicolectomy. Liver, spleen, pancreas, kidneys, and adrenal glands normal.  Soft  tissue masses calcifications at lesser sac, 4.1 x 2.6 x 4.2 cm little changed. Nodular area again identified along the intra abdominal wall the site of prior laparotomy scar. These include areas of thickening extending towards right rectus muscle measuring 3.2 x 1.5 cm image 28 and at the midline 2.1 x 1.4 cm. Question additional anterior abdominal wall nodularity more inferiorly at the anterior pelvis though this is less well defined. Additional soft tissue nodule at the ventral scar 14 x 9 mm image 59.  Retroperitoneal tumor again identified including a 3.4 x 1.9 cm diameter mass adjacent to the right iliopsoas image 53, anterior to the iliac bifurcation 2.3 x 0.5 cm, and question of sigmoid mesocolon. Soft tissue nodularity more inferiorly in pelvis just above the vaginal cough again identified, little changed, with question soft tissue infiltration in the mid pelvis at the sigmoid mesocolon as well. No intraperitoneal free air free fluid. No definite osseous metastases identified.  IMPRESSION: Persistent visualization of retroperitoneal tumor as well as calcified peritoneal metastatic disease at the lesser sac. Additional intrapelvic metastases suspected as well as suspected tumor implants along the anterior abdominal wall and at the ventral surgical scar.   Original Report Authenticated By: Ulyses Southward, M.D.     ASSESSMENT:  Have personally reviewed patients the recent CT scan of the abdomen and pelvis done when she visited the emergency room; it appears to me that patient has essentially stable disease without any convincing evidence of progression.  Slight interval increase in CEA is noted however would not make determination of progression in this.  As regards the side effects; it seemed to be mostly grade 1 and actually improving especially behind her hand and foot syndrome.  At this time patient I agreed to continuing her treatment and give it a try of at least 2-3 months before making a decision and  progression. She was scheduled to undergo a CT of the abd/pelvis/chest next month, however looking at her previous chest CT scans there was no evidence of metastasis which makes the possibility of finding something new very low. Think we can skip these scans and maybe obtain another set of CT of the chest abdomen and pelvis in 3 months time, since she just had abd and pelvis scan at the ER recently.  PLAN:  1. She  will continue Stivarga as recommended. 2. She'll return to clinic in 4 weeks (she did express desire to travel around this time but not sure exactly when she would do this, I asked her to call and inform [place] of any future travel plans ) 3. A instructed her  to apply lotion to her hands and feet.  4. Labs from next visit has already been ordered will be to CEA and see where it is at that time.    All questions were satisfactorily answered.She knows to call if she has any concern.  I spent more than 50 % counseling the patient face to face. The total time spent in the appointment was 30 minutes.   Sherral Hammers, MD FACP. Hematology/Oncology.

## 2013-03-21 ENCOUNTER — Other Ambulatory Visit (HOSPITAL_COMMUNITY): Payer: Medicare Other

## 2013-04-07 ENCOUNTER — Other Ambulatory Visit (HOSPITAL_COMMUNITY): Payer: Self-pay | Admitting: Oncology

## 2013-04-08 ENCOUNTER — Telehealth (HOSPITAL_COMMUNITY): Payer: Self-pay | Admitting: *Deleted

## 2013-04-08 ENCOUNTER — Other Ambulatory Visit (HOSPITAL_COMMUNITY): Payer: Self-pay | Admitting: *Deleted

## 2013-04-08 DIAGNOSIS — C189 Malignant neoplasm of colon, unspecified: Secondary | ICD-10-CM

## 2013-04-08 MED ORDER — DIPHENOXYLATE-ATROPINE 2.5-0.025 MG PO TABS: ORAL_TABLET | ORAL | Status: DC

## 2013-04-08 NOTE — Telephone Encounter (Signed)
Stop the Stivarga due to patient complaining of red dots like blisters on her feet and foot pain worse on left. Patient is out of town and therefore we can not assess the degree of foot pain/problem. Patient also experiencing spitting up, no appetite, not being able to eat, and stomach cramping and diarrhea. Tom advised patient to stop taking Stivarga until she returns from out of town and sees him. Patient's last dose of Stivarga was 04/08/13 am. [facility] [place] [place].

## 2013-04-10 ENCOUNTER — Other Ambulatory Visit (HOSPITAL_COMMUNITY): Payer: Self-pay | Admitting: Oncology

## 2013-04-10 DIAGNOSIS — I10 Essential (primary) hypertension: Secondary | ICD-10-CM

## 2013-04-10 DIAGNOSIS — E119 Type 2 diabetes mellitus without complications: Secondary | ICD-10-CM

## 2013-04-10 DIAGNOSIS — G4700 Insomnia, unspecified: Secondary | ICD-10-CM

## 2013-04-10 DIAGNOSIS — C189 Malignant neoplasm of colon, unspecified: Secondary | ICD-10-CM

## 2013-04-10 MED ORDER — METFORMIN HCL 500 MG PO TABS: 500.0000 mg | ORAL_TABLET | Freq: Two times a day (BID) | ORAL | Status: DC

## 2013-04-10 MED ORDER — TEMAZEPAM 15 MG PO CAPS: ORAL_CAPSULE | ORAL | Status: DC

## 2013-04-10 MED ORDER — AMLODIPINE BESYLATE-VALSARTAN 5-160 MG PO TABS: 1.0000 | ORAL_TABLET | Freq: Every day | ORAL | Status: DC

## 2013-04-14 ENCOUNTER — Ambulatory Visit (HOSPITAL_COMMUNITY): Payer: Medicare Other | Admitting: Oncology

## 2013-04-14 ENCOUNTER — Other Ambulatory Visit (HOSPITAL_COMMUNITY): Payer: Medicare Other

## 2013-04-14 ENCOUNTER — Ambulatory Visit (HOSPITAL_COMMUNITY): Payer: PRIVATE HEALTH INSURANCE

## 2013-04-18 ENCOUNTER — Other Ambulatory Visit (HOSPITAL_COMMUNITY): Payer: Medicare Other

## 2013-04-18 ENCOUNTER — Encounter (HOSPITAL_COMMUNITY): Payer: PRIVATE HEALTH INSURANCE | Attending: Oncology

## 2013-04-18 ENCOUNTER — Ambulatory Visit (HOSPITAL_COMMUNITY): Payer: Medicare Other

## 2013-04-18 ENCOUNTER — Ambulatory Visit (HOSPITAL_COMMUNITY): Payer: PRIVATE HEALTH INSURANCE

## 2013-04-18 DIAGNOSIS — Z452 Encounter for adjustment and management of vascular access device: Secondary | ICD-10-CM

## 2013-04-18 DIAGNOSIS — C189 Malignant neoplasm of colon, unspecified: Secondary | ICD-10-CM | POA: Insufficient documentation

## 2013-04-18 LAB — CBC WITH DIFFERENTIAL/PLATELET
Eosinophils Relative: 1 % (ref 0–5)
HCT: 33.5 % — ABNORMAL LOW (ref 36.0–46.0)
Lymphocytes Relative: 17 % (ref 12–46)
Lymphs Abs: 1.1 10*3/uL (ref 0.7–4.0)
MCV: 93.8 fL (ref 78.0–100.0)
Monocytes Absolute: 0.7 10*3/uL (ref 0.1–1.0)
Monocytes Relative: 11 % (ref 3–12)
RBC: 3.57 MIL/uL — ABNORMAL LOW (ref 3.87–5.11)
WBC: 6.9 10*3/uL (ref 4.0–10.5)

## 2013-04-18 LAB — COMPREHENSIVE METABOLIC PANEL
AST: 23 U/L (ref 0–37)
Albumin: 3.1 g/dL — ABNORMAL LOW (ref 3.5–5.2)
BUN: 12 mg/dL (ref 6–23)
Creatinine, Ser: 0.79 mg/dL (ref 0.50–1.10)
Potassium: 3.6 mEq/L (ref 3.5–5.1)
Total Protein: 6.5 g/dL (ref 6.0–8.3)

## 2013-04-18 MED ORDER — SODIUM CHLORIDE 0.9 % IJ SOLN
10.0000 mL | INTRAMUSCULAR | Status: DC | PRN
  Administered 2013-04-18: 10 mL via INTRAVENOUS
  Filled 2013-04-18: qty 10

## 2013-04-18 MED ORDER — HEPARIN SOD (PORK) LOCK FLUSH 100 UNIT/ML IV SOLN
500.0000 [IU] | Freq: Once | INTRAVENOUS | Status: AC
  Administered 2013-04-18: 500 [IU] via INTRAVENOUS
  Filled 2013-04-18: qty 5

## 2013-04-18 MED ORDER — HEPARIN SOD (PORK) LOCK FLUSH 100 UNIT/ML IV SOLN
INTRAVENOUS | Status: AC
  Filled 2013-04-18: qty 5

## 2013-04-18 NOTE — Progress Notes (Signed)
Yolanda Friedman presented for Portacath access and flush.  Proper placement of portacath confirmed by CXR.  Portacath located right chest wall accessed with  H 20 needle.  Good blood return present. Portacath flushed with 20ml NS and 500U/43ml Heparin and needle removed intact.  Procedure tolerated well and without incident.  Yolanda Friedman asked to schedule her next 6 week port flush and stated she will schedule her appt with her next visit.

## 2013-04-19 LAB — CEA: CEA: 14 ng/mL — ABNORMAL HIGH (ref 0.0–5.0)

## 2013-04-21 ENCOUNTER — Ambulatory Visit (HOSPITAL_COMMUNITY): Payer: PRIVATE HEALTH INSURANCE

## 2013-04-22 ENCOUNTER — Encounter (HOSPITAL_COMMUNITY): Payer: Self-pay | Admitting: Oncology

## 2013-04-22 ENCOUNTER — Encounter (HOSPITAL_BASED_OUTPATIENT_CLINIC_OR_DEPARTMENT_OTHER): Payer: PRIVATE HEALTH INSURANCE | Admitting: Oncology

## 2013-04-22 VITALS — BP 152/89 | HR 114 | Temp 98.4°F | Resp 18 | Wt 174.6 lb

## 2013-04-22 DIAGNOSIS — C7960 Secondary malignant neoplasm of unspecified ovary: Secondary | ICD-10-CM

## 2013-04-22 DIAGNOSIS — C189 Malignant neoplasm of colon, unspecified: Secondary | ICD-10-CM

## 2013-04-22 DIAGNOSIS — T50995A Adverse effect of other drugs, medicaments and biological substances, initial encounter: Secondary | ICD-10-CM

## 2013-04-22 MED ORDER — REGORAFENIB 40 MG PO TABS: 120.0000 mg | ORAL_TABLET | Freq: Every day | ORAL | Status: DC

## 2013-04-22 NOTE — Progress Notes (Signed)
[name], NP No address on file  Metastatic recurrent adenocarcinoma of colon  CURRENT THERAPY:Stivarga (Regorafenib) 160 mg PO daily x 21 days with 7 day respite. Started ~01/27/2013.  Will reduce to 120 mg 21 days on and 7 days off.  INTERVAL HISTORY: Yolanda Friedman 62 y.o. female returns for  regular  visit for followup of metastatic adenocarcinoma of colon (KRAS MUTATION DETECTED) with progression despite FOLFOX + Avastin followed by FOLFIRI + Avastin. She was seen by Dr. Lattie Corns at [facility] who recommended Stivarga versus clinical trial. Patient has opted for treatment with Stivarga prior to enrolling in a study. Stivarga (Regorafenib) 160 mg PO daily x 21 days with 7 day respite. Started ~01/27/2013, CEA on 01/20/2013 was 12.1.  The patient went out of town 2 weeks ago and contacted the clinic and reported "Stop the Stivarga due to patient complaining of red dots like blisters on her feet and foot pain worse on left. Patient is out of town and therefore we can not assess the degree of foot pain/problem. Patient also experiencing spitting up, no appetite, not being able to eat, and stomach cramping and diarrhea. Tom advised patient to stop taking Stivarga until she returns from out of town and sees him. Patient's last dose of Stivarga was 04/08/13 am. [facility] [place] [place]." according to nursing note per my recommendations.    She reports that she feels significantly better now that she has been off the drug x 14 days.  She reports that all of her symptoms mentioned above have resolved including a headache that she was having.    On examination of her extremities, her hands and feet do not display any signs of hand-foot syndrome.    I provided her with a few treatment options including transitioning to comfort care or restarting stivarga with a dose reduction. She is agreeable to restarting the medication at a reduced dose.  I will therefore  reduce her doe to 120 mg daily x 21 days with 7 day respite...then repeated.  Further dose reduction to 80 mg can be performed in the future if needed.   She would like an additional week break from therapy and she will start the medication this week.  Past Medical History  Diagnosis Date  . Hypertension   . Cancer   . Colon cancer 2005    metastatic 2012  . 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 Diabetes mellitus   . HTN (hypertension) 12/22/2011  . Shortness of breath   . GERD (gastroesophageal reflux disease)   . Anxiety     has Metastatic recurrent adenocarcinoma of colon; HTN (hypertension); and Nausea on her problem list.     has No Known Allergies.  Ms. Strine had no medications administered during this visit.  Past Surgical History  Procedure Laterality Date  . Cholecystectomy  1987  . Colectomy  2005    colon ca  . Abdominal hysterectomy  12/20/2010    met colon ca  . Portacath placement  01/2011  . Cataract extraction w/phaco  08/08/2012    Procedure: CATARACT EXTRACTION PHACO AND INTRAOCULAR LENS PLACEMENT (IOC);  Surgeon: Gemma Payor, MD;  Location: [facility];  Service: Ophthalmology;  Laterality: Right;  CDE:16.18  . Cataract extraction w/phaco  08/26/2012    Procedure: CATARACT EXTRACTION PHACO AND INTRAOCULAR LENS PLACEMENT (IOC);  Surgeon: Gemma Payor, MD;  Location: [facility];  Service: Ophthalmology;  Laterality: Left;  CDE  7.64    Denies any headaches, dizziness, double vision, fevers, chills, night sweats, nausea, vomiting, diarrhea, constipation, chest pain, heart palpitations, shortness of breath, blood in stool, black tarry stool, urinary pain, urinary burning, urinary frequency, hematuria.   PHYSICAL EXAMINATION  ECOG PERFORMANCE STATUS: 1 - Symptomatic but completely ambulatory  Filed Vitals:   04/22/13 1209  BP: 152/89  Pulse: 114  Temp: 98.4 F (36.9 C)  Resp: 18    GENERAL:alert, no distress, well nourished, well developed,  comfortable, cooperative, obese and smiling SKIN: skin color, texture, turgor are normal, no rashes or significant lesions HEAD: Normocephalic, No masses, lesions, tenderness or abnormalities EYES: normal, PERRLA, EOMI, Conjunctiva are pink and non-injected EARS: External ears normal OROPHARYNX:mucous membranes are moist  NECK: supple, no adenopathy, thyroid normal size, non-tender, without nodularity, no stridor, non-tender, trachea midline LYMPH:  no palpable lymphadenopathy, no hepatosplenomegaly BREAST:not examined LUNGS: clear to auscultation and percussion HEART: regular rate & rhythm, no murmurs, no gallops, S1 normal and S2 normal ABDOMEN:abdomen soft, non-tender, obese and normal bowel sounds BACK: Back symmetric, no curvature., No CVA tenderness EXTREMITIES:less then 2 second capillary refill, no joint deformities, effusion, or inflammation, no edema, no skin discoloration, no clubbing, no cyanosis  NEURO: alert & oriented x 3 with fluent speech, no focal motor/sensory deficits, gait normal    LABORATORY DATA: CBC    Component Value Date/Time   WBC 6.9 04/18/2013 1029   RBC 3.57* 04/18/2013 1029   HGB 10.7* 04/18/2013 1029   HCT 33.5* 04/18/2013 1029   PLT 151 04/18/2013 1029   MCV 93.8 04/18/2013 1029   MCH 30.0 04/18/2013 1029   MCHC 31.9 04/18/2013 1029   RDW 15.3 04/18/2013 1029   LYMPHSABS 1.1 04/18/2013 1029   MONOABS 0.7 04/18/2013 1029   EOSABS 0.1 04/18/2013 1029   BASOSABS 0.0 04/18/2013 1029      Chemistry      Component Value Date/Time   NA 136 04/18/2013 1029   K 3.6 04/18/2013 1029   CL 102 04/18/2013 1029   CO2 25 04/18/2013 1029   BUN 12 04/18/2013 1029   CREATININE 0.79 04/18/2013 1029      Component Value Date/Time   CALCIUM 9.1 04/18/2013 1029   ALKPHOS 60 04/18/2013 1029   AST 23 04/18/2013 1029   ALT 15 04/18/2013 1029   BILITOT 0.5 04/18/2013 1029      Lab Results  Component Value Date   CEA 14.0* 04/18/2013      ASSESSMENT:  1. Metastatic  adenocarcinoma of colon (KRAS MUTATION DETECTED) with progression despite FOLFOX + Avastin followed by FOLFIRI + Avastin. She was seen by Dr. Lattie Corns at [facility] who recommended Stivarga versus clinical trial. Patient has opted for treatment with Stivarga prior to enrolling in a study. Stivarga (Regorafenib) 160 mg PO daily x 21 days with 7 day respite. Started ~01/27/2013, CEA on 01/20/2013 was 12.1.  Will reduce to 120 mg 21 days on and 7 days off secondary to toxicities.  Patient Active Problem List   Diagnosis Date Noted  . Nausea 12/27/2011  . HTN (hypertension) 12/22/2011  . Metastatic recurrent adenocarcinoma of colon 03/13/2011     PLAN:  1. I personally reviewed and went over laboratory results with the patient. 2. I personally reviewed and went over radiographic studies with the patient. 3. Restaging CT scans CAP with contrast in 2 months. 4. Labs every 4 weeks: CBC diff, CMET, CEA 5. Reduce dose to Stivarga 120 mg days 1-21 with 7 days.  Will consider further reduction if necessary to 80 mg on the same schedule as mentioned above.  6. Return in 4 weeks for follow-up.   THERAPY PLAN:  Due to toxicities associated with Stivarga, will reduce doe to 120 mg (compared to 160 mg) daily x 21 days followed by 7 day respite; then repeated.  May consider further dose reduction to 80 mg if unacceptable side effects are noted.  Plan of restaging scans in 2 months to evaluate for response to therapy.   All questions were answered. The patient knows to call the clinic with any problems, questions or concerns. We can certainly see the patient much sooner if necessary.  Patient and plan discussed with Dr. Erline Hau and he is in agreement with the aforementioned.   KEFALAS,THOMAS

## 2013-04-22 NOTE — Patient Instructions (Addendum)
[facility] Discharge Instructions  RECOMMENDATIONS MADE BY THE CONSULTANT AND ANY TEST RESULTS WILL BE SENT TO YOUR REFERRING PHYSICIAN.   MEDICATIONS PRESCRIBED:  Change Stivarga to 120 mg (3 pills) daily for 21 days and then 7 days off, then repeated.  A new prescription reflecting this change was faxed to the specialty pharmacy.  INSTRUCTIONS GIVEN AND DISCUSSED: Restart medication next week  SPECIAL INSTRUCTIONS/FOLLOW-UP: Return in 4 weeks for labs and follow-up Will repeat CT scans in 2 months (end of October)  Thank you for choosing [facility] to provide your oncology and hematology care.  To afford each patient quality time with our providers, please arrive at least 15 minutes before your scheduled appointment time.  With your help, our goal is to use those 15 minutes to complete the necessary work-up to ensure our physicians have the information they need to help with your evaluation and healthcare recommendations.    Effective January 1st, 2014, we ask that you re-schedule your appointment with our physicians should you arrive 10 or more minutes late for your appointment.  We strive to give you quality time with our providers, and arriving late affects you and other patients whose appointments are after yours.    Again, thank you for choosing [facility].  Our hope is that these requests will decrease the amount of time that you wait before being seen by our physicians.       _____________________________________________________________  Should you have questions after your visit to [facility], please contact our office at [phone] between the hours of 8:30 a.m. and 5:00 p.m.  Voicemails left after 4:30 p.m. will not be returned until the following business day.  For prescription refill requests, have your pharmacy contact our office with your prescription refill request.

## 2013-04-28 ENCOUNTER — Ambulatory Visit (HOSPITAL_COMMUNITY): Payer: PRIVATE HEALTH INSURANCE | Admitting: Oncology

## 2013-04-29 ENCOUNTER — Telehealth (HOSPITAL_COMMUNITY): Payer: Self-pay | Admitting: *Deleted

## 2013-04-29 NOTE — Telephone Encounter (Signed)
Patient called to let us know that her Yolanda Friedman has not arrived at her house yet. She states that [facility] told her they would have it to her by tomorrow 04/30/13.

## 2013-05-23 ENCOUNTER — Other Ambulatory Visit (HOSPITAL_COMMUNITY): Payer: PRIVATE HEALTH INSURANCE

## 2013-05-26 ENCOUNTER — Encounter (HOSPITAL_COMMUNITY): Payer: Self-pay

## 2013-05-26 ENCOUNTER — Encounter (HOSPITAL_COMMUNITY): Payer: PRIVATE HEALTH INSURANCE | Attending: Oncology

## 2013-05-26 ENCOUNTER — Encounter (HOSPITAL_COMMUNITY): Payer: PRIVATE HEALTH INSURANCE

## 2013-05-26 VITALS — BP 129/75 | HR 91 | Temp 97.8°F | Resp 18 | Wt 174.3 lb

## 2013-05-26 DIAGNOSIS — C189 Malignant neoplasm of colon, unspecified: Secondary | ICD-10-CM | POA: Insufficient documentation

## 2013-05-26 DIAGNOSIS — Z452 Encounter for adjustment and management of vascular access device: Secondary | ICD-10-CM

## 2013-05-26 LAB — CBC WITH DIFFERENTIAL/PLATELET
Basophils Absolute: 0 10*3/uL (ref 0.0–0.1)
Basophils Relative: 0 % (ref 0–1)
Eosinophils Relative: 2 % (ref 0–5)
HCT: 34.5 % — ABNORMAL LOW (ref 36.0–46.0)
Lymphocytes Relative: 28 % (ref 12–46)
MCH: 30.1 pg (ref 26.0–34.0)
MCHC: 31.9 g/dL (ref 30.0–36.0)
MCV: 94.3 fL (ref 78.0–100.0)
Monocytes Absolute: 0.7 10*3/uL (ref 0.1–1.0)
RDW: 16.6 % — ABNORMAL HIGH (ref 11.5–15.5)

## 2013-05-26 LAB — COMPREHENSIVE METABOLIC PANEL
AST: 28 U/L (ref 0–37)
CO2: 23 mEq/L (ref 19–32)
Calcium: 9.4 mg/dL (ref 8.4–10.5)
Creatinine, Ser: 1.24 mg/dL — ABNORMAL HIGH (ref 0.50–1.10)
GFR calc Af Amer: 52 mL/min — ABNORMAL LOW (ref >90)
GFR calc non Af Amer: 45 mL/min — ABNORMAL LOW (ref >90)

## 2013-05-26 MED ORDER — SODIUM CHLORIDE 0.9 % IJ SOLN
10.0000 mL | INTRAMUSCULAR | Status: DC | PRN
  Administered 2013-05-26: 10 mL via INTRAVENOUS

## 2013-05-26 MED ORDER — HEPARIN SOD (PORK) LOCK FLUSH 100 UNIT/ML IV SOLN
INTRAVENOUS | Status: AC
  Filled 2013-05-26: qty 5

## 2013-05-26 MED ORDER — HEPARIN SOD (PORK) LOCK FLUSH 100 UNIT/ML IV SOLN
500.0000 [IU] | Freq: Once | INTRAVENOUS | Status: AC
  Administered 2013-05-26: 500 [IU] via INTRAVENOUS

## 2013-05-26 NOTE — Progress Notes (Signed)
Tegan J Pina presented for Portacath access and flush.  Proper placement of portacath confirmed by CXR.  Portacath located right chest wall accessed with  H 20 needle.  Good blood return present. Portacath flushed with 20ml NS and 500U/5ml Heparin and needle removed intact.  Procedure tolerated well and without incident.    

## 2013-05-26 NOTE — Progress Notes (Signed)
[facility] OFFICE PROGRESS NOTE  [name], NP No address on file  DIAGNOSIS: Metastatic recurrent adenocarcinoma of colon - Plan: CBC with Differential, Comprehensive metabolic panel, CEA, heparin lock flush 100 unit/mL, sodium chloride 0.9 % injection 10 mL  Chief Complaint  Patient presents with  . Rectal Cancer    CURRENT THERAPY: Regorafinib 120 mg daily for 3 weeks, rest 7 days, due to start new cycle on 05/28/2013  INTERVAL HISTORY: Yolanda Friedman 62 y.o. 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  She has been attempting to take it in the morning and has not been ingesting a low-fat diet along with that. She did have serious hand-foot syndrome with exfoliation of her soles with the higher dose of 160 mg and therefore dose was decreased to 120 mg daily. Appetite is fair with nausea associated with the drug itself. She does have antinauseants at home and she was advised to take them prior to an evening meal after which she should take the medication. She died emotionally swelling or redness, but does have chronic cough having never smoked in the past. She does have allergies in the fall. She denies any diarrhea, constipation, melena, hematochezia, epistaxis, with minimal sore throat. She denies any abdominal distention and still suffers from minimal paresthesias from a previous therapy. She is lactose intolerant.   MEDICAL HISTORY: Past Medical History  Diagnosis Date  . Hypertension   . Cancer   . Colon cancer 2005    metastatic 2012  . 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 Diabetes mellitus   . HTN (hypertension) 12/22/2011  . Shortness of breath   . GERD (gastroesophageal reflux disease)   . Anxiety     INTERIM HISTORY: has Metastatic recurrent adenocarcinoma of colon; HTN (hypertension); and Nausea on her problem list.     ALLERGIES:  has No Known Allergies.  MEDICATIONS: has a current medication list which includes the following prescription(s): albuterol, magic mouthwash, amlodipine-valsartan, aspirin, diphenoxylate-atropine, hydrocodone-acetaminophen, lorazepam, metformin, multiple vitamins-minerals, nabumetone, omeprazole, ondansetron, potassium chloride, regorafenib, and temazepam, and the following Facility-Administered Medications: heparin lock flush and sodium chloride.  SURGICAL HISTORY:  Past Surgical History  Procedure Laterality Date  . Cholecystectomy  1987  . Colectomy  2005    colon ca  . Abdominal hysterectomy  12/20/2010    met colon ca  . Portacath placement  01/2011  . Cataract extraction w/phaco  08/08/2012    Procedure: CATARACT EXTRACTION PHACO AND INTRAOCULAR LENS PLACEMENT (IOC);  Surgeon: Gemma Payor, MD;  Location: [facility];  Service: Ophthalmology;  Laterality: Right;  CDE:16.18  . Cataract extraction w/phaco  08/26/2012    Procedure: CATARACT EXTRACTION PHACO AND INTRAOCULAR LENS PLACEMENT (IOC);  Surgeon: Gemma Payor, MD;  Location: [facility];  Service: Ophthalmology;  Laterality: Left;  CDE 7.64    FAMILY HISTORY: family history includes Cancer in her mother; Diabetes in her sister; Hypertension in her maternal uncle and sister.  SOCIAL HISTORY:  reports that she has never smoked. She has never used smokeless tobacco. She reports that  drinks alcohol. She reports that she uses illicit drugs (Marijuana).  REVIEW OF SYSTEMS:  Other than that discussed above is noncontributory.  PHYSICAL EXAMINATION: ECOG PERFORMANCE STATUS: 1 - Symptomatic but completely ambulatory  Blood pressure 129/75, pulse 91, temperature 97.8 F (36.6 C), temperature source Oral, resp. rate 18, weight 174 lb 4.8 oz (79.062 kg).  GENERAL:alert, no distress and comfortable SKIN: skin color, texture, turgor are normal, no rashes or significant lesions EYES: normal, Conjunctiva are pink and non-injected, sclera  clear OROPHARYNX:no exudate, no erythema and lips, buccal mucosa, and tongue normal  NECK: supple, thyroid normal size, non-tender, without nodularity CHEST: Right anterior chest life port in place. No breast masses. LYMPH:  no palpable lymphadenopathy in the cervical, axillary or inguinal LUNGS: clear to auscultation and percussion with normal breathing effort HEART: regular rate & rhythm and no murmurs and no lower extremity edema ABDOMEN:abdomen soft, non-tender and normal bowel sounds. No free fluid wave or shifting dullness. No CVA tenderness or Musculoskeletal:no cyanosis of digits and no clubbing  NEURO: alert & oriented x 3 with fluent speech, no focal motor/sensory deficits   LABORATORY DATA: No visits with results within 30 Day(s) from this visit. Latest known visit with results is:  Infusion on 04/18/2013  Component Date Value Range Status  . CEA 04/18/2013 14.0* 0.0 - 5.0 ng/mL Final   Performed at [facility]  . Sodium 04/18/2013 136  135 - 145 mEq/L Final  . Potassium 04/18/2013 3.6  3.5 - 5.1 mEq/L Final  . Chloride 04/18/2013 102  96 - 112 mEq/L Final  . CO2 04/18/2013 25  19 - 32 mEq/L Final  . Glucose, Bld 04/18/2013 150* 70 - 99 mg/dL Final  . BUN 14/78/2956 12  6 - 23 mg/dL Final  . Creatinine, Ser 04/18/2013 0.79  0.50 - 1.10 mg/dL Final  . Calcium 21/30/8657 9.1  8.4 - 10.5 mg/dL Final  . Total Protein 04/18/2013 6.5  6.0 - 8.3 g/dL Final  . Albumin 84/69/6295 3.1* 3.5 - 5.2 g/dL Final  . AST 28/41/3244 23  0 - 37 U/L Final  . ALT 04/18/2013 15  0 - 35 U/L Final  . Alkaline Phosphatase 04/18/2013 60  39 - 117 U/L Final  . Total Bilirubin 04/18/2013 0.5  0.3 - 1.2 mg/dL Final  . GFR calc non Af Amer 04/18/2013 87* >90 mL/min Final  . GFR calc Af Amer 04/18/2013 >90  >90 mL/min Final   Comment: (NOTE)                          The eGFR has been calculated using the CKD EPI equation.                          This calculation has not been validated in  all clinical situations.                          eGFR's persistently <90 mL/min signify possible Chronic Kidney                          Disease.  . WBC 04/18/2013 6.9  4.0 - 10.5 K/uL Final  . RBC 04/18/2013 3.57* 3.87 - 5.11 MIL/uL Final  . Hemoglobin 04/18/2013 10.7* 12.0 - 15.0 g/dL Final  . HCT 09/06/7251 33.5* 36.0 - 46.0 % Final  . MCV 04/18/2013 93.8  78.0 - 100.0 fL Final  . MCH 04/18/2013 30.0  26.0 - 34.0 pg Final  . MCHC 04/18/2013 31.9  30.0 - 36.0 g/dL Final  . RDW 66/44/0347 15.3  11.5 - 15.5 % Final  . Platelets 04/18/2013 151  150 - 400 K/uL Final  . Neutrophils Relative % 04/18/2013 72  43 - 77 %  Final  . Neutro Abs 04/18/2013 5.0  1.7 - 7.7 K/uL Final  . Lymphocytes Relative 04/18/2013 17  12 - 46 % Final  . Lymphs Abs 04/18/2013 1.1  0.7 - 4.0 K/uL Final  . Monocytes Relative 04/18/2013 11  3 - 12 % Final  . Monocytes Absolute 04/18/2013 0.7  0.1 - 1.0 K/uL Final  . Eosinophils Relative 04/18/2013 1  0 - 5 % Final  . Eosinophils Absolute 04/18/2013 0.1  0.0 - 0.7 K/uL Final  . Basophils Relative 04/18/2013 0  0 - 1 % Final  . Basophils Absolute 04/18/2013 0.0  0.0 - 0.1 K/uL Final     Urinalysis    Component Value Date/Time   COLORURINE YELLOW 11/17/2010 1332   APPEARANCEUR CLEAR 11/17/2010 1332   LABSPEC 1.020 01/01/2013 0908   PHURINE 5.5 01/01/2013 0908   GLUCOSEU NEGATIVE 01/01/2013 0908   HGBUR NEGATIVE 01/01/2013 0908   BILIRUBINUR NEGATIVE 01/01/2013 0908   KETONESUR NEGATIVE 01/01/2013 0908   PROTEINUR NEGATIVE 01/01/2013 0908   UROBILINOGEN 0.2 01/01/2013 0908   NITRITE NEGATIVE 01/01/2013 0908   LEUKOCYTESUR NEGATIVE 01/01/2013 0908    RADIOGRAPHIC STUDIES: No results found.  ASSESSMENT: #1. Stage IV rectal cancer with peritoneal metastases but no ascites, significant hand-foot syndrome with full dose Regorafinib, improved on lower dose. #2. 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   PLAN: #1. If blood counts permit, the patient was told to adjust your dosage of  Regorafinib to take 120 mg alternating with 80 mg daily using 40 mg tablets. #2. She was told to call if severe hand-foot syndrome recurred. #3. She was told to use emollients on her hands and feet 3 or 4 times a day #4. Followup in [redacted] weeks along with CBC   All questions were answered. The patient knows to call the clinic with any problems, questions or concerns. We can certainly see the patient much sooner if necessary.  The patient and plan discussed with Alla German A and he is in agreement with the aforementioned.  I spent 30 minutes counseling the patient face to face. The total time spent in the appointment was 25 minutes.    Maurilio Lovely, MD 05/26/2013 1:48 PM

## 2013-05-26 NOTE — Patient Instructions (Addendum)
[facility] Discharge Instructions  RECOMMENDATIONS MADE BY THE CONSULTANT AND ANY TEST RESULTS WILL BE SENT TO YOUR REFERRING PHYSICIAN.  EXAM FINDINGS BY THE PHYSICIAN TODAY AND SIGNS OR SYMPTOMS TO REPORT TO CLINIC OR PRIMARY PHYSICIAN:you seen dr Zigmund Daniel today  MEDICATIONS PRESCRIBED: none    SPECIAL INSTRUCTIONS/FOLLOW-UP: Today we accessed your port and flush it and drew lab work.  He wants you to try taking your medication in the evening with dinner, takes 3 alternating with 2.  Will re-check in 4 weeks  Thank you for choosing [facility] to provide your oncology and hematology care.  To afford each patient quality time with our providers, please arrive at least 15 minutes before your scheduled appointment time.  With your help, our goal is to use those 15 minutes to complete the necessary work-up to ensure our physicians have the information they need to help with your evaluation and healthcare recommendations.    Effective January 1st, 2014, we ask that you re-schedule your appointment with our physicians should you arrive 10 or more minutes late for your appointment.  We strive to give you quality time with our providers, and arriving late affects you and other patients whose appointments are after yours.    Again, thank you for choosing [facility].  Our hope is that these requests will decrease the amount of time that you wait before being seen by our physicians.       _____________________________________________________________  Should you have questions after your visit to [facility], please contact our office at [phone] between the hours of 8:30 a.m. and 5:00 p.m.  Voicemails left after 4:30 p.m. will not be returned until the following business day.  For prescription refill requests, have your pharmacy contact our office with your prescription refill request.

## 2013-05-27 ENCOUNTER — Other Ambulatory Visit (HOSPITAL_COMMUNITY): Payer: Self-pay | Admitting: *Deleted

## 2013-05-27 DIAGNOSIS — C189 Malignant neoplasm of colon, unspecified: Secondary | ICD-10-CM

## 2013-05-27 MED ORDER — FIRST-DUKES MOUTHWASH MT SUSP: OROMUCOSAL | Status: DC

## 2013-05-28 ENCOUNTER — Other Ambulatory Visit (HOSPITAL_COMMUNITY): Payer: Self-pay | Admitting: Oncology

## 2013-05-28 DIAGNOSIS — C189 Malignant neoplasm of colon, unspecified: Secondary | ICD-10-CM

## 2013-05-28 DIAGNOSIS — R110 Nausea: Secondary | ICD-10-CM

## 2013-05-28 DIAGNOSIS — K219 Gastro-esophageal reflux disease without esophagitis: Secondary | ICD-10-CM

## 2013-05-28 MED ORDER — HYDROCODONE-ACETAMINOPHEN 10-325 MG PO TABS: 1.0000 | ORAL_TABLET | Freq: Four times a day (QID) | ORAL | Status: DC | PRN

## 2013-05-28 MED ORDER — OMEPRAZOLE 20 MG PO CPDR: 20.0000 mg | DELAYED_RELEASE_CAPSULE | Freq: Two times a day (BID) | ORAL | Status: DC

## 2013-05-28 MED ORDER — ONDANSETRON HCL 8 MG PO TABS: 8.0000 mg | ORAL_TABLET | Freq: Two times a day (BID) | ORAL | Status: DC | PRN

## 2013-06-05 ENCOUNTER — Other Ambulatory Visit (HOSPITAL_COMMUNITY): Payer: Self-pay | Admitting: Oncology

## 2013-06-05 DIAGNOSIS — C189 Malignant neoplasm of colon, unspecified: Secondary | ICD-10-CM

## 2013-06-05 MED ORDER — HYDROCODONE-ACETAMINOPHEN 10-325 MG PO TABS: 1.0000 | ORAL_TABLET | Freq: Four times a day (QID) | ORAL | Status: DC | PRN

## 2013-06-09 ENCOUNTER — Other Ambulatory Visit (HOSPITAL_COMMUNITY): Payer: Self-pay | Admitting: Oncology

## 2013-06-16 ENCOUNTER — Encounter (HOSPITAL_COMMUNITY): Payer: PRIVATE HEALTH INSURANCE | Attending: Oncology

## 2013-06-16 ENCOUNTER — Encounter (HOSPITAL_COMMUNITY): Payer: Self-pay

## 2013-06-16 VITALS — BP 123/80 | HR 86 | Temp 98.1°F | Resp 18 | Wt 168.5 lb

## 2013-06-16 DIAGNOSIS — C189 Malignant neoplasm of colon, unspecified: Secondary | ICD-10-CM

## 2013-06-16 LAB — COMPREHENSIVE METABOLIC PANEL
ALT: 25 U/L (ref 0–35)
Albumin: 3.3 g/dL — ABNORMAL LOW (ref 3.5–5.2)
Alkaline Phosphatase: 73 U/L (ref 39–117)
BUN: 11 mg/dL (ref 6–23)
Chloride: 104 mEq/L (ref 96–112)
GFR calc Af Amer: 66 mL/min — ABNORMAL LOW (ref >90)
Glucose, Bld: 94 mg/dL (ref 70–99)
Potassium: 4.1 mEq/L (ref 3.5–5.1)
Total Bilirubin: 0.3 mg/dL (ref 0.3–1.2)

## 2013-06-16 LAB — CBC WITH DIFFERENTIAL/PLATELET
Basophils Absolute: 0 10*3/uL (ref 0.0–0.1)
Basophils Relative: 0 % (ref 0–1)
Eosinophils Absolute: 0.1 10*3/uL (ref 0.0–0.7)
Eosinophils Relative: 2 % (ref 0–5)
HCT: 34.7 % — ABNORMAL LOW (ref 36.0–46.0)
Hemoglobin: 11.3 g/dL — ABNORMAL LOW (ref 12.0–15.0)
Lymphocytes Relative: 25 % (ref 12–46)
Lymphs Abs: 1.6 10*3/uL (ref 0.7–4.0)
MCH: 30.2 pg (ref 26.0–34.0)
MCHC: 32.6 g/dL (ref 30.0–36.0)
MCV: 92.8 fL (ref 78.0–100.0)
Monocytes Absolute: 0.5 10*3/uL (ref 0.1–1.0)
Monocytes Relative: 8 % (ref 3–12)
Neutro Abs: 4 10*3/uL (ref 1.7–7.7)
Neutrophils Relative %: 65 % (ref 43–77)
Platelets: 290 10*3/uL (ref 150–400)
RBC: 3.74 MIL/uL — ABNORMAL LOW (ref 3.87–5.11)
RDW: 15.4 % (ref 11.5–15.5)
WBC: 6.3 10*3/uL (ref 4.0–10.5)

## 2013-06-16 MED ORDER — HEPARIN SOD (PORK) LOCK FLUSH 100 UNIT/ML IV SOLN
500.0000 [IU] | Freq: Once | INTRAVENOUS | Status: AC
  Administered 2013-06-16: 500 [IU] via INTRAVENOUS
  Filled 2013-06-16: qty 5

## 2013-06-16 MED ORDER — SODIUM CHLORIDE 0.9 % IJ SOLN
10.0000 mL | INTRAMUSCULAR | Status: DC | PRN
  Administered 2013-06-16: 10 mL via INTRAVENOUS

## 2013-06-16 NOTE — Patient Instructions (Signed)
[facility] Discharge Instructions  RECOMMENDATIONS MADE BY THE CONSULTANT AND ANY TEST RESULTS WILL BE SENT TO YOUR REFERRING PHYSICIAN.  EXAM FINDINGS BY THE PHYSICIAN TODAY AND SIGNS OR SYMPTOMS TO REPORT TO CLINIC OR PRIMARY PHYSICIAN: Exam and findings as discussed by Dr. Zigmund Daniel.  We will see fi we can get assistance with the Stivarga.  Report uncontrolled, pain, nausea, vomiting or diarrhea.  MEDICATIONS PRESCRIBED:  Continue with same regimen of Stivarga  INSTRUCTIONS/FOLLOW-UP: Follow-up in 4 weeks with blood work and office visit.  Thank you for choosing [facility] to provide your oncology and hematology care.  To afford each patient quality time with our providers, please arrive at least 15 minutes before your scheduled appointment time.  With your help, our goal is to use those 15 minutes to complete the necessary work-up to ensure our physicians have the information they need to help with your evaluation and healthcare recommendations.    Effective January 1st, 2014, we ask that you re-schedule your appointment with our physicians should you arrive 10 or more minutes late for your appointment.  We strive to give you quality time with our providers, and arriving late affects you and other patients whose appointments are after yours.    Again, thank you for choosing [facility].  Our hope is that these requests will decrease the amount of time that you wait before being seen by our physicians.       _____________________________________________________________  Should you have questions after your visit to [facility], please contact our office at [phone] between the hours of 8:30 a.m. and 5:00 p.m.  Voicemails left after 4:30 p.m. will not be returned until the following business day.  For prescription refill requests, have your pharmacy contact our office with your prescription refill request.

## 2013-06-16 NOTE — Progress Notes (Signed)
Yolanda Friedman presented for Portacath access and flush. Proper placement of portacath confirmed by CXR. Portacath located right chest wall accessed with  H 20 needle. Good blood return present. Portacath flushed with 20ml NS and 500U/4ml Heparin and needle removed intact. Procedure without incident. Patient tolerated procedure well.

## 2013-06-16 NOTE — Progress Notes (Signed)
[facility] OFFICE PROGRESS NOTE  [name], NP No address on file  DIAGNOSIS: Metastatic recurrent adenocarcinoma of colon - Plan: CBC with Differential, Comprehensive metabolic panel, CEA, CBC with Differential, Comprehensive metabolic panel, CEA, Schedule Portacath Flush Appointment, heparin lock flush 100 unit/mL, sodium chloride 0.9 % injection 10 mL  Chief Complaint  Patient presents with  . Colon Cancer    Followup on Regorafinib    CURRENT THERAPY: Regorafinib 120 mg alternating with 80 mg daily  INTERVAL HISTORY: Yolanda Friedman 62 y.o. female returns for followup of recurrent colon cancer currently taking Regorafinib at a lower dose of 120 mg alternating with 80 mg daily because of development of severe hand-foot syndrome.  She has developed a blister on her right fifth toe and is going to see a podiatrist. Previously noted neuropathy and redness of her hands have improved dramatically on lower dose Regorafinib. She denies any fever, night sweats, but does have occasional nausea without persistent diarrhea. She denies any melena, hematochezia, hematuria, abdominal distention, fever, night sweats, skin rash, headache, or seizures. She has occasional discomfort in her mouth which is controlled with mouthwash.  MEDICAL HISTORY: Past Medical History  Diagnosis Date  . Hypertension   . Cancer   . Colon cancer 2005    metastatic 2012  . 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 Diabetes mellitus   . HTN (hypertension) 12/22/2011  . Shortness of breath   . GERD (gastroesophageal reflux disease)   . Anxiety     INTERIM HISTORY: has Metastatic recurrent adenocarcinoma of colon; HTN (hypertension); and Nausea on her problem list.  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  MEDICATIONS: has a current medication list which includes the following prescription(s): albuterol, amlodipine-valsartan,  aspirin, diphenoxylate-atropine, hydrocodone-acetaminophen, lorazepam, metformin, multiple vitamins-minerals, omeprazole, ondansetron, potassium chloride, stivarga, temazepam, magic mouthwash, and nabumetone, and the following Facility-Administered Medications: sodium chloride.  SURGICAL HISTORY:  Past Surgical History  Procedure Laterality Date  . Cholecystectomy  1987  . Colectomy  2005    colon ca  . Abdominal hysterectomy  12/20/2010    met colon ca  . Portacath placement  01/2011  . Cataract extraction w/phaco  08/08/2012    Procedure: CATARACT EXTRACTION PHACO AND INTRAOCULAR LENS PLACEMENT (IOC);  Surgeon: Gemma Payor, MD;  Location: [facility];  Service: Ophthalmology;  Laterality: Right;  CDE:16.18  . Cataract extraction w/phaco  08/26/2012    Procedure: CATARACT EXTRACTION PHACO AND INTRAOCULAR LENS PLACEMENT (IOC);  Surgeon: Gemma Payor, MD;  Location: [facility];  Service: Ophthalmology;  Laterality: Left;  CDE 7.64    FAMILY HISTORY: family history includes Cancer in her mother; Diabetes in her sister; Hypertension in her maternal uncle and sister.  SOCIAL HISTORY:  reports that she has never smoked. She has never used smokeless tobacco. She reports that she drinks alcohol. She reports that she uses illicit drugs (Marijuana and Solvent inhalants).  REVIEW OF SYSTEMS:  Other than that discussed above is noncontributory.  PHYSICAL EXAMINATION: ECOG PERFORMANCE STATUS: 1 - Symptomatic but completely ambulatory  Blood pressure 123/80, pulse 86, temperature 98.1 F (36.7 C), temperature source Oral, resp. rate 18, weight 168 lb 8 oz (76.431 kg).  GENERAL:alert, no distress and comfortable SKIN: skin color, texture, turgor are normal, no rashes or significant lesions EYES: PERLA; Conjunctiva are pink and non-injected, sclera clear OROPHARYNX:no exudate, no erythema on lips, buccal mucosa, or tongue. NECK: supple, thyroid normal size, non-tender, without nodularity.  No masses CHEST:  Increased AP diameter with light port in place. No breast masses. LYMPH:  no palpable lymphadenopathy in the cervical, axillary or inguinal LUNGS: clear to auscultation and percussion with normal breathing effort HEART: regular rate & rhythm and no murmurs and no lower extremity edema ABDOMEN:abdomen soft, non-tender and normal bowel sounds. No rebound tenderness or free fluid wave. MUSCULOSKELETAL:no cyanosis of digits and no clubbing. Range of motion normal. The fifth toe on the right foot shows a small area of hyperkeratosis which is probably related to local trauma from wearing shoes. NEURO: alert & oriented x 3 with fluent speech, no focal motor/sensory deficits   LABORATORY DATA: Office Visit on 06/16/2013  Component Date Value Range Status  . WBC 06/16/2013 6.3  4.0 - 10.5 K/uL Final  . RBC 06/16/2013 3.74* 3.87 - 5.11 MIL/uL Final  . Hemoglobin 06/16/2013 11.3* 12.0 - 15.0 g/dL Final  . HCT 16/06/9603 34.7* 36.0 - 46.0 % Final  . MCV 06/16/2013 92.8  78.0 - 100.0 fL Final  . MCH 06/16/2013 30.2  26.0 - 34.0 pg Final  . MCHC 06/16/2013 32.6  30.0 - 36.0 g/dL Final  . RDW 54/05/8118 15.4  11.5 - 15.5 % Final  . Platelets 06/16/2013 290  150 - 400 K/uL Final  . Neutrophils Relative % 06/16/2013 65  43 - 77 % Final  . Neutro Abs 06/16/2013 4.0  1.7 - 7.7 K/uL Final  . Lymphocytes Relative 06/16/2013 25  12 - 46 % Final  . Lymphs Abs 06/16/2013 1.6  0.7 - 4.0 K/uL Final  . Monocytes Relative 06/16/2013 8  3 - 12 % Final  . Monocytes Absolute 06/16/2013 0.5  0.1 - 1.0 K/uL Final  . Eosinophils Relative 06/16/2013 2  0 - 5 % Final  . Eosinophils Absolute 06/16/2013 0.1  0.0 - 0.7 K/uL Final  . Basophils Relative 06/16/2013 0  0 - 1 % Final  . Basophils Absolute 06/16/2013 0.0  0.0 - 0.1 K/uL Final  . Sodium 06/16/2013 141  135 - 145 mEq/L Final  . Potassium 06/16/2013 4.1  3.5 - 5.1 mEq/L Final  . Chloride 06/16/2013 104  96 - 112 mEq/L Final  . CO2 06/16/2013 25  19 - 32 mEq/L  Final  . Glucose, Bld 06/16/2013 94  70 - 99 mg/dL Final  . BUN 14/78/2956 11  6 - 23 mg/dL Final  . Creatinine, Ser 06/16/2013 1.03  0.50 - 1.10 mg/dL Final  . Calcium 21/30/8657 9.5  8.4 - 10.5 mg/dL Final  . Total Protein 06/16/2013 7.3  6.0 - 8.3 g/dL Final  . Albumin 84/69/6295 3.3* 3.5 - 5.2 g/dL Final  . AST 28/41/3244 37  0 - 37 U/L Final  . ALT 06/16/2013 25  0 - 35 U/L Final  . Alkaline Phosphatase 06/16/2013 73  39 - 117 U/L Final  . Total Bilirubin 06/16/2013 0.3  0.3 - 1.2 mg/dL Final  . GFR calc non Af Amer 06/16/2013 57* >90 mL/min Final  . GFR calc Af Amer 06/16/2013 66* >90 mL/min Final   Comment: (NOTE)                          The eGFR has been calculated using the CKD EPI equation.                          This calculation has not been validated in all clinical situations.  eGFR's persistently <90 mL/min signify possible Chronic Kidney                          Disease.  Office Visit on 05/26/2013  Component Date Value Range Status  . WBC 05/26/2013 7.2  4.0 - 10.5 K/uL Final  . RBC 05/26/2013 3.66* 3.87 - 5.11 MIL/uL Final  . Hemoglobin 05/26/2013 11.0* 12.0 - 15.0 g/dL Final  . HCT 16/06/9603 34.5* 36.0 - 46.0 % Final  . MCV 05/26/2013 94.3  78.0 - 100.0 fL Final  . MCH 05/26/2013 30.1  26.0 - 34.0 pg Final  . MCHC 05/26/2013 31.9  30.0 - 36.0 g/dL Final  . RDW 54/05/8118 16.6* 11.5 - 15.5 % Final  . Platelets 05/26/2013 177  150 - 400 K/uL Final  . Neutrophils Relative % 05/26/2013 60  43 - 77 % Final  . Neutro Abs 05/26/2013 4.3  1.7 - 7.7 K/uL Final  . Lymphocytes Relative 05/26/2013 28  12 - 46 % Final  . Lymphs Abs 05/26/2013 2.0  0.7 - 4.0 K/uL Final  . Monocytes Relative 05/26/2013 10  3 - 12 % Final  . Monocytes Absolute 05/26/2013 0.7  0.1 - 1.0 K/uL Final  . Eosinophils Relative 05/26/2013 2  0 - 5 % Final  . Eosinophils Absolute 05/26/2013 0.1  0.0 - 0.7 K/uL Final  . Basophils Relative 05/26/2013 0  0 - 1 % Final  .  Basophils Absolute 05/26/2013 0.0  0.0 - 0.1 K/uL Final  . Sodium 05/26/2013 138  135 - 145 mEq/L Final  . Potassium 05/26/2013 4.3  3.5 - 5.1 mEq/L Final  . Chloride 05/26/2013 104  96 - 112 mEq/L Final  . CO2 05/26/2013 23  19 - 32 mEq/L Final  . Glucose, Bld 05/26/2013 121* 70 - 99 mg/dL Final  . BUN 14/78/2956 19  6 - 23 mg/dL Final  . Creatinine, Ser 05/26/2013 1.24* 0.50 - 1.10 mg/dL Final  . Calcium 21/30/8657 9.4  8.4 - 10.5 mg/dL Final  . Total Protein 05/26/2013 6.9  6.0 - 8.3 g/dL Final  . Albumin 84/69/6295 3.4* 3.5 - 5.2 g/dL Final  . AST 28/41/3244 28  0 - 37 U/L Final  . ALT 05/26/2013 19  0 - 35 U/L Final  . Alkaline Phosphatase 05/26/2013 70  39 - 117 U/L Final  . Total Bilirubin 05/26/2013 0.4  0.3 - 1.2 mg/dL Final  . GFR calc non Af Amer 05/26/2013 45* >90 mL/min Final  . GFR calc Af Amer 05/26/2013 52* >90 mL/min Final   Comment: (NOTE)                          The eGFR has been calculated using the CKD EPI equation.                          This calculation has not been validated in all clinical situations.                          eGFR's persistently <90 mL/min signify possible Chronic Kidney                          Disease.  . CEA 05/26/2013 13.6* 0.0 - 5.0 ng/mL Final   Performed at [facility]    PATHOLOGY:  Urinalysis  Component Value Date/Time   COLORURINE YELLOW 11/17/2010 1332   APPEARANCEUR CLEAR 11/17/2010 1332   LABSPEC 1.020 01/01/2013 0908   PHURINE 5.5 01/01/2013 0908   GLUCOSEU NEGATIVE 01/01/2013 0908   HGBUR NEGATIVE 01/01/2013 0908   BILIRUBINUR NEGATIVE 01/01/2013 0908   KETONESUR NEGATIVE 01/01/2013 0908   PROTEINUR NEGATIVE 01/01/2013 0908   UROBILINOGEN 0.2 01/01/2013 0908   NITRITE NEGATIVE 01/01/2013 0908   LEUKOCYTESUR NEGATIVE 01/01/2013 0908    RADIOGRAPHIC STUDIES: No results found.  ASSESSMENT: #1. 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 #2. Right fifth toe hyperkeratosis. #3. Stable CEA. #4. 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  PLAN: #1. Continue Regorafinib 120 mg alternating with 80 mg daily and referral to social service for possible further acquisition of drug from the manufacturer due to lack of coverage. #2. Followup with a podiatrist. #3. Followup in 4 weeks with CBC, chem profile, and CEA. #4. Cancel upcoming scans and followup lab tests alone for the time being.   All questions were answered. The patient knows to call the clinic with any problems, questions or concerns. We can certainly see the patient much sooner if necessary.   I spent 30 minutes counseling the patient face to face. The total time spent in the appointment was 25 minutes.    Maurilio Lovely, MD 06/16/2013 4:17 PM

## 2013-06-17 LAB — CEA: CEA: 10.1 ng/mL — ABNORMAL HIGH (ref 0.0–5.0)

## 2013-06-18 ENCOUNTER — Telehealth (HOSPITAL_COMMUNITY): Payer: Self-pay | Admitting: Dietician

## 2013-06-18 NOTE — Telephone Encounter (Signed)
Nutrition Brief Note  Patient identified on the [facility] Nutrition Screen with MST score of 2.  Wt Readings from Last 15 Encounters:  06/16/13 168 lb 8 oz (76.431 kg)  05/26/13 174 lb 4.8 oz (79.062 kg)  04/22/13 174 lb 9.6 oz (79.198 kg)  03/17/13 177 lb 9.6 oz (80.559 kg)  03/08/13 182 lb (82.555 kg)  02/17/13 186 lb 9.6 oz (84.641 kg)  01/17/13 191 lb 9.6 oz (86.909 kg)  12/25/12 191 lb (86.637 kg)  12/11/12 195 lb 3.2 oz (88.542 kg)  11/27/12 191 lb (86.637 kg)  11/06/12 193 lb 6.4 oz (87.726 kg)  10/23/12 195 lb 9.6 oz (88.724 kg)  09/11/12 188 lb (85.276 kg)  08/06/12 192 lb (87.091 kg)  05/07/12 183 lb 9.6 oz (83.28 kg)   Pt with metastatic colon cancer, currently receiving chemotherapy. Wt hx reveals a 5.1% wt loss x 3 months and an 8.2 % wt loss x 1 year. Also reveals a 13.7% wt loss, which is clinically significant.  Called and spoke with pt at 1026. She reports a poor appetite, stating she has "good days and bad days". She is unable to tell me how long her appetite has been poor. She also complains of difficulty swallowing, stating "sometimes foods get stuck", relating the problems to acid reflux.  She reports she eat 2-3 meals daily, with several snacks. She has tried Ensure in the past, but dislikes the "thick texture". She reports eating a balanced diet with several protein sources, such as yogurt and powdered milk.  Educated pt on high protein diet with small, frequent meals to improve intake and prevent further weight loss. Discussed sources of high protein foods. Also discussed a mechanically altered diet with tender meats with added gravies and sauces for ease of intake. Also encouraged her to chop or ground meats. Discussed ways to add protein in diet, including adding powered milk to soups, gravies and sauces for additional calories and protein. Teach back method used. Pt very appreciative of call.   Arlind Klingerman A. Mayford Knife, RD, LDN Pager: [phone]

## 2013-06-26 ENCOUNTER — Emergency Department (HOSPITAL_COMMUNITY): Payer: PRIVATE HEALTH INSURANCE

## 2013-06-26 ENCOUNTER — Encounter (HOSPITAL_COMMUNITY): Payer: Self-pay | Admitting: Emergency Medicine

## 2013-06-26 ENCOUNTER — Inpatient Hospital Stay (HOSPITAL_COMMUNITY)
Admission: EM | Admit: 2013-06-26 | Discharge: 2013-06-27 | DRG: 065 | Disposition: A | Discharge status: Home / Self Care | Payer: PRIVATE HEALTH INSURANCE | Attending: Internal Medicine | Admitting: Internal Medicine

## 2013-06-26 ENCOUNTER — Inpatient Hospital Stay (HOSPITAL_COMMUNITY): Payer: PRIVATE HEALTH INSURANCE

## 2013-06-26 ENCOUNTER — Telehealth (HOSPITAL_COMMUNITY): Payer: Self-pay

## 2013-06-26 ENCOUNTER — Other Ambulatory Visit: Payer: Self-pay

## 2013-06-26 DIAGNOSIS — F411 Generalized anxiety disorder: Secondary | ICD-10-CM | POA: Diagnosis present

## 2013-06-26 DIAGNOSIS — I6350 Cerebral infarction due to unspecified occlusion or stenosis of unspecified cerebral artery: Principal | ICD-10-CM | POA: Diagnosis present

## 2013-06-26 DIAGNOSIS — Z9221 Personal history of antineoplastic chemotherapy: Secondary | ICD-10-CM

## 2013-06-26 DIAGNOSIS — E1149 Type 2 diabetes mellitus with other diabetic neurological complication: Secondary | ICD-10-CM | POA: Diagnosis present

## 2013-06-26 DIAGNOSIS — Z9049 Acquired absence of other specified parts of digestive tract: Secondary | ICD-10-CM

## 2013-06-26 DIAGNOSIS — Z8249 Family history of ischemic heart disease and other diseases of the circulatory system: Secondary | ICD-10-CM

## 2013-06-26 DIAGNOSIS — D649 Anemia, unspecified: Secondary | ICD-10-CM

## 2013-06-26 DIAGNOSIS — I10 Essential (primary) hypertension: Secondary | ICD-10-CM | POA: Diagnosis present

## 2013-06-26 DIAGNOSIS — Z79899 Other long term (current) drug therapy: Secondary | ICD-10-CM

## 2013-06-26 DIAGNOSIS — Z794 Long term (current) use of insulin: Secondary | ICD-10-CM

## 2013-06-26 DIAGNOSIS — E663 Overweight: Secondary | ICD-10-CM | POA: Diagnosis present

## 2013-06-26 DIAGNOSIS — Z833 Family history of diabetes mellitus: Secondary | ICD-10-CM

## 2013-06-26 DIAGNOSIS — C189 Malignant neoplasm of colon, unspecified: Secondary | ICD-10-CM

## 2013-06-26 DIAGNOSIS — E1142 Type 2 diabetes mellitus with diabetic polyneuropathy: Secondary | ICD-10-CM | POA: Diagnosis present

## 2013-06-26 DIAGNOSIS — K219 Gastro-esophageal reflux disease without esophagitis: Secondary | ICD-10-CM | POA: Diagnosis present

## 2013-06-26 DIAGNOSIS — I639 Cerebral infarction, unspecified: Secondary | ICD-10-CM

## 2013-06-26 HISTORY — DX: Anemia, unspecified: D64.9

## 2013-06-26 LAB — CBC WITH DIFFERENTIAL/PLATELET
Basophils Relative: 0 % (ref 0–1)
Eosinophils Relative: 1 % (ref 0–5)
HCT: 31.1 % — ABNORMAL LOW (ref 36.0–46.0)
Hemoglobin: 10 g/dL — ABNORMAL LOW (ref 12.0–15.0)
Lymphocytes Relative: 24 % (ref 12–46)
Lymphs Abs: 1.6 10*3/uL (ref 0.7–4.0)
MCV: 92.3 fL (ref 78.0–100.0)
Monocytes Absolute: 0.9 10*3/uL (ref 0.1–1.0)
Monocytes Relative: 13 % — ABNORMAL HIGH (ref 3–12)
Platelets: 194 10*3/uL (ref 150–400)
RBC: 3.37 MIL/uL — ABNORMAL LOW (ref 3.87–5.11)
WBC: 6.7 10*3/uL (ref 4.0–10.5)

## 2013-06-26 LAB — BASIC METABOLIC PANEL
BUN: 11 mg/dL (ref 6–23)
CO2: 24 mEq/L (ref 19–32)
Calcium: 9.2 mg/dL (ref 8.4–10.5)
GFR calc Af Amer: 72 mL/min — ABNORMAL LOW (ref >90)
Glucose, Bld: 105 mg/dL — ABNORMAL HIGH (ref 70–99)
Potassium: 4.1 mEq/L (ref 3.5–5.1)
Sodium: 138 mEq/L (ref 135–145)

## 2013-06-26 LAB — GLUCOSE, CAPILLARY: Glucose-Capillary: 76 mg/dL (ref 70–99)

## 2013-06-26 MED ORDER — POTASSIUM CHLORIDE CRYS ER 10 MEQ PO TBCR
20.0000 meq | EXTENDED_RELEASE_TABLET | Freq: Every day | ORAL | Status: DC
  Administered 2013-06-27: 20 meq via ORAL
  Filled 2013-06-26 (×2): qty 2

## 2013-06-26 MED ORDER — ENOXAPARIN SODIUM 40 MG/0.4ML ~~LOC~~ SOLN
40.0000 mg | SUBCUTANEOUS | Status: DC
  Administered 2013-06-26: 40 mg via SUBCUTANEOUS
  Filled 2013-06-26: qty 0.4

## 2013-06-26 MED ORDER — INSULIN ASPART 100 UNIT/ML ~~LOC~~ SOLN: 0.0000 [IU] | Freq: Three times a day (TID) | SUBCUTANEOUS | Status: DC

## 2013-06-26 MED ORDER — ASPIRIN 300 MG RE SUPP
RECTAL | Status: AC
  Filled 2013-06-26: qty 1

## 2013-06-26 MED ORDER — SENNOSIDES-DOCUSATE SODIUM 8.6-50 MG PO TABS: 1.0000 | ORAL_TABLET | Freq: Every evening | ORAL | Status: DC | PRN

## 2013-06-26 MED ORDER — PANTOPRAZOLE SODIUM 40 MG PO TBEC
40.0000 mg | DELAYED_RELEASE_TABLET | Freq: Every day | ORAL | Status: DC
  Administered 2013-06-27: 40 mg via ORAL
  Filled 2013-06-26: qty 1

## 2013-06-26 MED ORDER — ASPIRIN 325 MG PO TABS
325.0000 mg | ORAL_TABLET | Freq: Every day | ORAL | Status: DC
  Administered 2013-06-26 – 2013-06-27 (×2): 325 mg via ORAL
  Filled 2013-06-26 (×2): qty 1

## 2013-06-26 MED ORDER — LORAZEPAM 2 MG/ML IJ SOLN
1.0000 mg | Freq: Once | INTRAMUSCULAR | Status: AC
  Administered 2013-06-26: 1 mg via INTRAMUSCULAR
  Filled 2013-06-26: qty 1

## 2013-06-26 MED ORDER — ALBUTEROL SULFATE HFA 108 (90 BASE) MCG/ACT IN AERS: 2.0000 | INHALATION_SPRAY | Freq: Four times a day (QID) | RESPIRATORY_TRACT | Status: DC | PRN

## 2013-06-26 MED ORDER — LORAZEPAM 1 MG PO TABS
1.0000 mg | ORAL_TABLET | ORAL | Status: DC | PRN
  Administered 2013-06-27: 1 mg via ORAL
  Filled 2013-06-26: qty 1

## 2013-06-26 MED ORDER — REGORAFENIB 40 MG PO TABS: 80.0000 mg | ORAL_TABLET | Freq: Every day | ORAL | Status: DC

## 2013-06-26 MED ORDER — ASPIRIN 300 MG RE SUPP
300.0000 mg | Freq: Every day | RECTAL | Status: DC
  Filled 2013-06-26 (×5): qty 1

## 2013-06-26 MED ORDER — TEMAZEPAM 15 MG PO CAPS: 15.0000 mg | ORAL_CAPSULE | Freq: Every evening | ORAL | Status: DC | PRN

## 2013-06-26 NOTE — ED Provider Notes (Signed)
CSN: [id number]     Arrival date & time 06/26/13  1605 History  This chart was scribed for Yolanda Lennert, MD by Bennett Scrape, ED Scribe. This patient was seen in room APA01/APA01 and the patient's care was started at 5:03 PM.   Chief Complaint  Patient presents with  . Numbness    Patient is a 62 y.o. female presenting with general illness. The history is provided by the patient. No language interpreter was used.  Illness Location:  Right arm and bilateral lips Quality:  Burning, tingling Severity:  Mild Timing:  Constant Progression:  Unchanged Chronicity:  New Context:  CA pt receiving chemo Relieved by:  None Worsened by:  None Associated symptoms: no abdominal pain, no chest pain, no congestion, no cough, no diarrhea, no fatigue, no headaches and no rash     HPI Comments: Yolanda Friedman is a 62 y.o. female who presents to the Emergency Department complaining of paresthesias to the right arm and bilateral lips described as a burning, tingling feeling. She states that she is currently getting chemotherapy for metastatic colon cancer. She reports that she was off of the chemo for 7 days but recently started back within the past 3 days. She states that she cannot remember the exact onset of the symptoms but states that they started after the chemo. She reports that she has had 3 operations for her colon CA, the first one occuring in 2005 and the last one in 2012. She denies any prior episodes of similar symptoms. She denies any trouble speaking or confusion as associated symptoms.   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    Past Medical History  Diagnosis Date  . Hypertension   . Cancer   . Colon cancer 2005    metastatic 2012  . 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 Diabetes mellitus   . HTN (hypertension) 12/22/2011  . Shortness of breath   . GERD (gastroesophageal reflux disease)   . Anxiety    Past Surgical History  Procedure Laterality Date  .  Cholecystectomy  1987  . Colectomy  2005    colon ca  . Abdominal hysterectomy  12/20/2010    met colon ca  . Portacath placement  01/2011  . Cataract extraction w/phaco  08/08/2012    Procedure: CATARACT EXTRACTION PHACO AND INTRAOCULAR LENS PLACEMENT (IOC);  Surgeon: Gemma Payor, MD;  Location: [facility];  Service: Ophthalmology;  Laterality: Right;  CDE:16.18  . Cataract extraction w/phaco  08/26/2012    Procedure: CATARACT EXTRACTION PHACO AND INTRAOCULAR LENS PLACEMENT (IOC);  Surgeon: Gemma Payor, MD;  Location: [facility];  Service: Ophthalmology;  Laterality: Left;  CDE 7.64   Family History  Problem Relation Age of Onset  . Cancer Mother   . Hypertension Sister   . Diabetes Sister   . Hypertension Maternal Uncle    History  Substance Use Topics  . Smoking status: Never Smoker   . Smokeless tobacco: Never Used  . Alcohol Use: Yes     Comment: occassional   No OB history provided.  Review of Systems  Constitutional: Negative for appetite change and fatigue.  HENT: Negative for congestion, ear discharge and sinus pressure.   Eyes: Negative for discharge.  Respiratory: Negative for cough.   Cardiovascular: Negative for chest pain.  Gastrointestinal: Negative for abdominal pain and diarrhea.  Genitourinary: Negative for frequency and hematuria.  Musculoskeletal: Negative for back pain.  Skin: Negative for rash.  Neurological: Positive for numbness. Negative for seizures, weakness  and headaches.  Psychiatric/Behavioral: Negative for hallucinations.    Allergies  Review of patient's allergies indicates no known allergies.  Home Medications   Current Outpatient Rx  Name  Route  Sig  Dispense  Refill  . albuterol (PROVENTIL HFA;VENTOLIN HFA) 108 (90 BASE) MCG/ACT inhaler   Inhalation   Inhale 2 puffs into the lungs every 6 (six) hours as needed for wheezing.   1 Inhaler   2   . Alum & Mag Hydroxide-Simeth (MAGIC MOUTHWASH) SOLN   Oral   Take 5 mLs by mouth 4 (four) times  daily.   240 mL   1   . amLODipine-valsartan (EXFORGE) 5-160 MG per tablet   Oral   Take 1 tablet by mouth daily.   30 tablet   2   . aspirin 81 MG tablet   Oral   Take 81 mg by mouth daily.           . diphenoxylate-atropine (LOMOTIL) 2.5-0.025 MG per tablet      May take 1 to 2 tablets every 4 hours prn loose stools.   45 tablet   0   . HYDROcodone-acetaminophen (NORCO) 10-325 MG per tablet   Oral   Take 1 tablet by mouth every 6 (six) hours as needed. FOR PAIN   60 tablet   0   . LORazepam (ATIVAN) 1 MG tablet   Oral   Take 1 tablet (1 mg total) by mouth every 4 (four) hours as needed (for nausea and vomiting). Nausea/vomiting   30 tablet   2   . metFORMIN (GLUCOPHAGE) 500 MG tablet   Oral   Take 1 tablet (500 mg total) by mouth 2 (two) times daily with a meal.   60 tablet   2   . Multiple Vitamins-Minerals (ONE-A-DAY 50 PLUS PO)   Oral   Take 1 tablet by mouth daily.          . nabumetone (RELAFEN) 750 MG tablet   Oral   Take 750 mg by mouth 2 (two) times daily.         Marland Kitchen omeprazole (PRILOSEC) 20 MG capsule   Oral   Take 1 capsule (20 mg total) by mouth 2 (two) times daily.   60 capsule   3   . ondansetron (ZOFRAN) 8 MG tablet   Oral   Take 1 tablet (8 mg total) by mouth 2 (two) times daily as needed for nausea (vomiting).   40 tablet   3   . potassium chloride (K-DUR) 10 MEQ tablet   Oral   Take 2 tablets (20 mEq total) by mouth daily.   60 tablet   2   . STIVARGA 40 MG tablet      TAKE 3 TABLETS BY MOUTH   EVERY DAY WITH A LOW FAT  MEAL   84 tablet   0   . temazepam (RESTORIL) 15 MG capsule      Take 1 or 2 capsules as needed for sleep nightly   60 capsule   2    Triage Vitals: BP 134/77  Pulse 110  Temp(Src) 98 F (36.7 C) (Oral)  Resp 16  SpO2 100%  Physical Exam  Nursing note and vitals reviewed. Constitutional: She is oriented to person, place, and time. She appears well-developed.  HENT:  Head: Normocephalic and  atraumatic.  Eyes: Conjunctivae and EOM are normal. No scleral icterus.  Neck: Neck supple. No thyromegaly present.  Cardiovascular: Normal rate and regular rhythm.  Exam reveals no gallop  and no friction rub.   No murmur heard. Pulmonary/Chest: Effort normal. No stridor. She has no wheezes. She has no rales. She exhibits no tenderness.  Abdominal: She exhibits no distension. There is no tenderness. There is no rebound.  Musculoskeletal: Normal range of motion. She exhibits no edema.  Lymphadenopathy:    She has no cervical adenopathy.  Neurological: She is alert and oriented to person, place, and time. She exhibits normal muscle tone. Coordination normal.  Mild decreased sensation to bilateral lips and right hand down to fingertips   Skin: Skin is warm and dry. No rash noted. No erythema.  Psychiatric: She has a normal mood and affect. Her behavior is normal.    ED Course  Procedures (including critical care time)  DIAGNOSTIC STUDIES: Oxygen Saturation is 100% on room air, normal by my interpretation.    COORDINATION OF CARE: 5:05 PM-Discussed treatment plan which includes CT of head with pt at bedside and pt agreed to plan.   7:46 PM-Informed pt of CT scan showing TIA. Discussed admission to receive anticoagulants with pt and pt agreed.  8:07 PM-Consult complete with Dr. Sharl Ma, Hospitalist. Patient case explained and discussed. Dr. Sharl Ma agrees to admit patient to tele, team 1 for further evaluation and treatment. Call ended at 8:09 PM.  Labs Review Labs Reviewed - No data to display Imaging Review Ct Head Wo Contrast  06/26/2013   CLINICAL DATA:  Hypertension, metastatic colon cancer, numbness  EXAM: CT HEAD WITHOUT CONTRAST  TECHNIQUE: Contiguous axial images were obtained from the base of the skull through the vertex without intravenous contrast.  COMPARISON:  None.  FINDINGS: No acute intracranial hemorrhage, definite mass lesion, mass effect, focal edema, acute infarction,  midline shift, herniation, hydrocephalus, or extra-axial fluid collection. Normal gray-white matter differentiation. Dense pineal calcification noted. Cisterns are patent. No cerebellar abnormality. Orbits are symmetric. Mastoids and sinuses visualized are clear. No skull abnormality.  IMPRESSION: No acute intracranial finding by noncontrast CT.   Electronically Signed   By: Ruel Favors M.D.   On: 06/26/2013 16:43   Mr Brain Wo Contrast  06/26/2013   CLINICAL DATA:  Right facial numbness. Right arm and hand numbness. History of colon cancer.  EXAM: MRI HEAD WITHOUT CONTRAST  TECHNIQUE: Multiplanar, multisequence MR imaging was performed. No intravenous contrast was administered.  COMPARISON:  06/26/2013 head CT.  FINDINGS: Exam is motion degraded. Patient received sedation.  Acute small nonhemorrhagic infarct at the junction of the left thalamus and posterior limb of the left internal capsule.  No intracranial hemorrhage.  Remote tiny right cerebellar infarcts.  Mild to moderate small vessel disease type changes.  Major intracranial vascular structures appear patent.  Exophthalmos.  IMPRESSION: Acute small nonhemorrhagic infarct at the junction of the left thalamus and posterior limb of the left internal capsule.  These results were called by telephone at the time of interpretation on 06/26/2013 at 7:32 PM to Dr. Bethann Berkshire , who verbally acknowledged these results.   Electronically Signed   By: Bridgett Larsson M.D.   On: 06/26/2013 19:33    EKG Interpretation   None       Date: 06/26/2013  Rate: 97  Rhythm: normal sinus rhythm  QRS Axis: normal  Intervals: normal  ST/T Wave abnormalities: normal  Conduction Disutrbances:none  Narrative Interpretation:   Old EKG Reviewed: none available   MDM  No diagnosis found.  The chart was scribed for me under my direct supervision.  I personally performed the history, physical, and medical decision  making and all procedures in the evaluation of this  patient.Yolanda Lennert, MD 06/26/13 2016

## 2013-06-26 NOTE — ED Notes (Signed)
Pt states the numbness is better at this time. Pt informed we are waiting on the MRI results.

## 2013-06-26 NOTE — H&P (Signed)
PCP:   [name], NP   Chief Complaint:  Numbness of right arm and face  HPI: 62 year old female with a history of metastatic adenocarcinoma of colon currently on chemotherapy with Regorafenib, hypertension, diabetes mellitus who came to the hospital after patient had episode of numbness of the right hand and face which started around 3:30 PM when patient was walking in a store. Patient says that the numbness lasted up to a few minutes ago around 9 PM. At this time she is not complaining of numbness or weakness. Patient did not have significant weakness of right upper extremity, she did not have slurred speech, no blurred vision, denies passing out, no seizure. In the ED CT scan of the head showed no acute intracranial finding, MRI brain was done which showed acute small non-is infarct at the junction of the left thalamus and posterior limb of the left internal capsule. Patient has been taking baby aspirin at home. No previous history of stroke or seizures  Allergies:  No Known Allergies    Past Medical History  Diagnosis Date  . Hypertension   . Cancer   . Colon cancer 2005    metastatic 2012  . 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 Diabetes mellitus   . HTN (hypertension) 12/22/2011  . Shortness of breath   . GERD (gastroesophageal reflux disease)   . Anxiety     Past Surgical History  Procedure Laterality Date  . Cholecystectomy  1987  . Colectomy  2005    colon ca  . Abdominal hysterectomy  12/20/2010    met colon ca  . Portacath placement  01/2011  . Cataract extraction w/phaco  08/08/2012    Procedure: CATARACT EXTRACTION PHACO AND INTRAOCULAR LENS PLACEMENT (IOC);  Surgeon: Gemma Payor, MD;  Location: [facility];  Service: Ophthalmology;  Laterality: Right;  CDE:16.18  . Cataract extraction w/phaco  08/26/2012    Procedure: CATARACT EXTRACTION PHACO AND INTRAOCULAR LENS PLACEMENT (IOC);  Surgeon: Gemma Payor, MD;  Location: [facility];  Service: Ophthalmology;   Laterality: Left;  CDE 7.64    Prior to Admission medications   Medication Sig Start Date End Date Taking? Authorizing Provider  albuterol (PROVENTIL HFA;VENTOLIN HFA) 108 (90 BASE) MCG/ACT inhaler Inhale 2 puffs into the lungs every 6 (six) hours as needed for wheezing. 11/27/12  Yes Randall An, MD  Alum & Mag Hydroxide-Simeth (MAGIC MOUTHWASH) SOLN Take 5 mLs by mouth 4 (four) times daily. 12/13/12  Yes Randall An, MD  amLODipine-valsartan (EXFORGE) 5-160 MG per tablet Take 1 tablet by mouth daily. 04/10/13  Yes Ellouise Newer, PA-C  aspirin 81 MG tablet Take 81 mg by mouth daily.     Yes Historical Provider, MD  HYDROcodone-acetaminophen (NORCO) 10-325 MG per tablet Take 1 tablet by mouth every 6 (six) hours as needed. FOR PAIN 06/05/13  Yes Maurine Minister Kefalas, PA-C  LORazepam (ATIVAN) 1 MG tablet Take 1 tablet (1 mg total) by mouth every 4 (four) hours as needed (for nausea and vomiting). Nausea/vomiting 09/18/12  Yes Ellouise Newer, PA-C  metFORMIN (GLUCOPHAGE) 500 MG tablet Take 1 tablet (500 mg total) by mouth 2 (two) times daily with a meal. 04/10/13 04/10/14 Yes Maurine Minister Kefalas, PA-C  Multiple Vitamins-Minerals (ONE-A-DAY 50 PLUS PO) Take 1 tablet by mouth daily.    Yes Historical Provider, MD  omeprazole (PRILOSEC) 20 MG capsule Take 1 capsule (20 mg total) by mouth 2 (two) times daily. 05/28/13 05/28/14 Yes Maurine Minister Kefalas, PA-C  ondansetron (  ZOFRAN) 8 MG tablet Take 1 tablet (8 mg total) by mouth 2 (two) times daily as needed for nausea (vomiting). 05/28/13  Yes Maurine Minister Kefalas, PA-C  potassium chloride (K-DUR) 10 MEQ tablet Take 2 tablets (20 mEq total) by mouth daily. 03/10/13  Yes Ellouise Newer, PA-C  regorafenib (STIVARGA) 40 MG tablet Take 80-120 mg by mouth daily with breakfast. Patient alternates taking three tablets with taking two tablets daily.Take with low fat meal. Caution: Chemotherapy.   Yes Historical Provider, MD  temazepam (RESTORIL) 15 MG capsule Take 1 or 2  capsules as needed for sleep nightly 04/10/13  Yes Ellouise Newer, PA-C    Social History:  reports that she has never smoked. She has never used smokeless tobacco. She reports that she drinks alcohol. She reports that she uses illicit drugs (Marijuana and Solvent inhalants).  Family History  Problem Relation Age of Onset  . Cancer Mother   . Hypertension Sister   . Diabetes Sister   . Hypertension Maternal Uncle      All the positives are listed in BOLD  Review of Systems:  HEENT: Headache, blurred vision, runny nose, sore throat Neck: Hypothyroidism, hyperthyroidism,,lymphadenopathy Chest : Shortness of breath, history of COPD, Asthma Heart : Chest pain, history of coronary arterey disease GI:  Nausea, vomiting, diarrhea, constipation, GERD GU: Dysuria, urgency, frequency of urination, hematuria Neuro: Stroke, seizures, syncope Psych: Depression, anxiety, hallucinations   Physical Exam: Blood pressure 127/75, pulse 98, temperature 98 F (36.7 C), temperature source Oral, resp. rate 18, SpO2 100.00%. Constitutional:   Patient is a well-developed and well-nourished female in no acute distress and cooperative with exam. Head: Normocephalic and atraumatic Mouth: Mucus membranes moist Eyes: PERRL, EOMI, conjunctivae normal Neck: Supple, No Thyromegaly Cardiovascular: RRR, S1 normal, S2 normal Pulmonary/Chest: CTAB, no wheezes, rales, or rhonchi Abdominal: Soft. Non-tender, non-distended, bowel sounds are normal, no masses, organomegaly, or guarding present.   Mental Status:  Alert. Speech normal, no dysarthria. Able to follow 3 step commands. Oriented x 3 Cranial Nerves:  II:  Visual fields grossly intact.  III/IV/VI:  Extraocular movements intact. Pupils reactive bilaterally. No ptosis V/VII:  No  facial droop. facial light touch sensation normal bilaterally.  VIII:  Grossly intact.  IX/X:  Grossly normal gag.  XI:  Grossly normal  XII:  Midline tongue extension  normal.  Motor:  RUE  5/5 hand grip and 5/5 biceps/triceps strength.  LUE  Hand grip 5/5. 5/5 RLE and 5-/5 LLE  Sensory:  Light touch intact throughout, bilaterally  DTRs:  Unobtainable  Cerebellar:  Finger-to-nose intact with the LUE and RUE   Extremities : No Cyanosis, Clubbing or Edema   Labs on Admission:  Results for orders placed during the hospital encounter of 06/26/13 (from the past 48 hour(s))  CBC WITH DIFFERENTIAL     Status: Abnormal   Collection Time    06/26/13  5:15 PM      Result Value Range   WBC 6.7  4.0 - 10.5 K/uL   RBC 3.37 (*) 3.87 - 5.11 MIL/uL   Hemoglobin 10.0 (*) 12.0 - 15.0 g/dL   HCT 16.1 (*) 09.6 - 04.5 %   MCV 92.3  78.0 - 100.0 fL   MCH 29.7  26.0 - 34.0 pg   MCHC 32.2  30.0 - 36.0 g/dL   RDW 40.9  81.1 - 91.4 %   Platelets 194  150 - 400 K/uL   Neutrophils Relative % 62  43 - 77 %  Neutro Abs 4.1  1.7 - 7.7 K/uL   Lymphocytes Relative 24  12 - 46 %   Lymphs Abs 1.6  0.7 - 4.0 K/uL   Monocytes Relative 13 (*) 3 - 12 %   Monocytes Absolute 0.9  0.1 - 1.0 K/uL   Eosinophils Relative 1  0 - 5 %   Eosinophils Absolute 0.1  0.0 - 0.7 K/uL   Basophils Relative 0  0 - 1 %   Basophils Absolute 0.0  0.0 - 0.1 K/uL  BASIC METABOLIC PANEL     Status: Abnormal   Collection Time    06/26/13  5:15 PM      Result Value Range   Sodium 138  135 - 145 mEq/L   Potassium 4.1  3.5 - 5.1 mEq/L   Chloride 103  96 - 112 mEq/L   CO2 24  19 - 32 mEq/L   Glucose, Bld 105 (*) 70 - 99 mg/dL   BUN 11  6 - 23 mg/dL   Creatinine, Ser 4.54  0.50 - 1.10 mg/dL   Calcium 9.2  8.4 - 09.8 mg/dL   GFR calc non Af Amer 62 (*) >90 mL/min   GFR calc Af Amer 72 (*) >90 mL/min   Comment: (NOTE)     The eGFR has been calculated using the CKD EPI equation.     This calculation has not been validated in all clinical situations.     eGFR's persistently <90 mL/min signify possible Chronic Kidney     Disease.    Radiological Exams on Admission: Ct Head Wo  Contrast  06/26/2013   CLINICAL DATA:  Hypertension, metastatic colon cancer, numbness  EXAM: CT HEAD WITHOUT CONTRAST  TECHNIQUE: Contiguous axial images were obtained from the base of the skull through the vertex without intravenous contrast.  COMPARISON:  None.  FINDINGS: No acute intracranial hemorrhage, definite mass lesion, mass effect, focal edema, acute infarction, midline shift, herniation, hydrocephalus, or extra-axial fluid collection. Normal gray-white matter differentiation. Dense pineal calcification noted. Cisterns are patent. No cerebellar abnormality. Orbits are symmetric. Mastoids and sinuses visualized are clear. No skull abnormality.  IMPRESSION: No acute intracranial finding by noncontrast CT.   Electronically Signed   By: Ruel Favors M.D.   On: 06/26/2013 16:43   Mr Brain Wo Contrast  06/26/2013   CLINICAL DATA:  Right facial numbness. Right arm and hand numbness. History of colon cancer.  EXAM: MRI HEAD WITHOUT CONTRAST  TECHNIQUE: Multiplanar, multisequence MR imaging was performed. No intravenous contrast was administered.  COMPARISON:  06/26/2013 head CT.  FINDINGS: Exam is motion degraded. Patient received sedation.  Acute small nonhemorrhagic infarct at the junction of the left thalamus and posterior limb of the left internal capsule.  No intracranial hemorrhage.  Remote tiny right cerebellar infarcts.  Mild to moderate small vessel disease type changes.  Major intracranial vascular structures appear patent.  Exophthalmos.  IMPRESSION: Acute small nonhemorrhagic infarct at the junction of the left thalamus and posterior limb of the left internal capsule.  These results were called by telephone at the time of interpretation on 06/26/2013 at 7:32 PM to Dr. Bethann Berkshire , who verbally acknowledged these results.   Electronically Signed   By: Bridgett Larsson M.D.   On: 06/26/2013 19:33    Assessment/Plan Principal Problem:   CVA (cerebral infarction) Active Problems:   Metastatic  recurrent adenocarcinoma of colon   HTN (hypertension)   Anemia  Patient has small acute nonemergent infarct in the left thalamus. We'll start  the patient on full dose aspirin, and admit for further workup to elicit the cause of the stroke. Will obtain carotid Dopplers, 2-D echo, MRA of the brain, lipid profile in a.m. , will get physical therapy consult. Patient will be continued on the home medications, will hold metformin and start sliding scale insulin. Will hold antihypertensives for permissive hypertension at this time  Code status: Full code  Family discussion: Discussed with patient in detail   Time Spent on Admission: 55 min  Micajah Dennin S [facility] Pager: [phone] 06/26/2013, 9:17 PM  If 7PM-7AM, please contact night-coverage  [email or web address]  Password [email or web address]

## 2013-06-26 NOTE — Telephone Encounter (Signed)
Call from patient stating "I was at the store and my lips, side of my face and both hands went numb."  Denies weakness but states that hands and arms feel stiff and funny.  Instructed to go to ED for evaluation.  Verbalized that she would do so.

## 2013-06-26 NOTE — ED Notes (Signed)
Numbness to right side of face and right arm

## 2013-06-26 NOTE — ED Notes (Signed)
Pt still in MRI, spoke to family in room.

## 2013-06-27 ENCOUNTER — Inpatient Hospital Stay (HOSPITAL_COMMUNITY): Payer: PRIVATE HEALTH INSURANCE

## 2013-06-27 DIAGNOSIS — I517 Cardiomegaly: Secondary | ICD-10-CM

## 2013-06-27 LAB — HEMOGLOBIN A1C
Hgb A1c MFr Bld: 6 % — ABNORMAL HIGH (ref <5.7)
Mean Plasma Glucose: 126 mg/dL — ABNORMAL HIGH (ref <117)

## 2013-06-27 LAB — LIPID PANEL
Cholesterol: 157 mg/dL (ref 0–200)
LDL Cholesterol: 91 mg/dL (ref 0–99)
Total CHOL/HDL Ratio: 4.6 RATIO
Triglycerides: 158 mg/dL — ABNORMAL HIGH (ref <150)

## 2013-06-27 LAB — GLUCOSE, CAPILLARY: Glucose-Capillary: 84 mg/dL (ref 70–99)

## 2013-06-27 MED ORDER — CLOPIDOGREL BISULFATE 75 MG PO TABS: 75.0000 mg | ORAL_TABLET | Freq: Every day | ORAL | Status: DC

## 2013-06-27 MED ORDER — REGORAFENIB 40 MG PO TABS: 80.0000 mg | ORAL_TABLET | ORAL | Status: DC

## 2013-06-27 MED ORDER — REGORAFENIB 40 MG PO TABS
80.0000 mg | ORAL_TABLET | Freq: Once | ORAL | Status: AC
  Administered 2013-06-27: 80 mg via ORAL

## 2013-06-27 MED ORDER — ASPIRIN 325 MG PO TABS: 325.0000 mg | ORAL_TABLET | Freq: Every day | ORAL | Status: DC

## 2013-06-27 MED ORDER — REGORAFENIB 40 MG PO TABS: 120.0000 mg | ORAL_TABLET | ORAL | Status: DC

## 2013-06-27 NOTE — Consult Note (Signed)
[facility] Tovah Slavick A. Gerilyn Pilgrim, MD     [email or web address]          Yolanda Friedman is an 62 y.o. female.   ASSESSMENT/PLAN: 1. Left thalamic capsular lacunar infarct. Risk factors hypertension and diabetes along with age. I recommend that the patient be placed on a combination of aspirin and Plavix for 3 months. Subsequent, she should be switched to a single agent. She can increase the aspirin to a full strength and discontinue the Plavix at that time. Other risk factor modifications are also suggested including blood pressure and blood sugar control.  2. Mild diabetic polyneuropathy.   3. Metastatic adenocarcinoma of the colon.  The patient is a 62 year old right-handed black female who presents with the acute onset of numbness and dysesthesia involving the right upper and lower extremity. Interestingly, she also reports some burning of the right upper extremity. The right upper extremity has been more symptomatic. Patient does not report having headaches or dysarthria. There is no loss of consciousness. She does not actually reports having weakness. The left side has been symptomatic. The patient does have a baseline history of neuropathy with numbness of the hands and feet bilaterally. She has had these symptoms for a few years. The patient reports that she has been on aspirin 81 mg and has been compliant with this. The right-sided symptoms have remained unchanged over night. She does not report chest pain, shortness of breath, GI or GU symptoms. The review of systems is otherwise negative.  GENERAL: This is a pleasant overweight lady in no acute distress.  HEENT: This is normocephalic/atraumatic.    ABDOMEN: soft  EXTREMITIES: No edema   BACK: Unremarkable.  SKIN: Normal by inspection.    MENTAL STATUS: Alert and oriented. Speech, language and cognition are generally intact. Judgment and insight normal.   CRANIAL NERVES: Pupils are equal, round and reactive to light  and accommodation; extra ocular movements are full, there is no significant nystagmus; visual fields are full; upper and lower facial muscles are normal in strength and symmetric, there is no flattening of the nasolabial folds; tongue is midline; uvula is midline; shoulder elevation is normal.  MOTOR: Normal tone, bulk and strength; no pronator drift.  COORDINATION: Left finger to nose is normal, right finger to nose is normal, No rest tremor; no intention tremor; no postural tremor; no bradykinesia.  REFLEXES: Deep tendon reflexes are symmetrical and normal. Plantar responses are flexor bilaterally.   SENSATION: Markedly diminished involving the right hand and right forearm.     Past Medical History  Diagnosis Date  . Hypertension   . Cancer   . Colon cancer 2005    metastatic 2012  . 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 Diabetes mellitus   . HTN (hypertension) 12/22/2011  . Shortness of breath   . GERD (gastroesophageal reflux disease)   . Anxiety     Past Surgical History  Procedure Laterality Date  . Cholecystectomy  1987  . Colectomy  2005    colon ca  . Abdominal hysterectomy  12/20/2010    met colon ca  . Portacath placement  01/2011  . Cataract extraction w/phaco  08/08/2012    Procedure: CATARACT EXTRACTION PHACO AND INTRAOCULAR LENS PLACEMENT (IOC);  Surgeon: Gemma Payor, MD;  Location: [facility];  Service: Ophthalmology;  Laterality: Right;  CDE:16.18  . Cataract extraction w/phaco  08/26/2012    Procedure: CATARACT EXTRACTION PHACO AND INTRAOCULAR LENS PLACEMENT (IOC);  Surgeon: Gemma Payor, MD;  Location: [facility];  Service: Ophthalmology;  Laterality: Left;  CDE 7.64    Family History  Problem Relation Age of Onset  . Cancer Mother   . Hypertension Sister   . Diabetes Sister   . Hypertension Maternal Uncle     Social History:  reports that she has never smoked. She has never used smokeless tobacco. She reports that she drinks alcohol. She  reports that she uses illicit drugs (Marijuana and Solvent inhalants).  Allergies: No Known Allergies  Medications: Prior to Admission medications   Medication Sig Start Date End Date Taking? Authorizing Provider  albuterol (PROVENTIL HFA;VENTOLIN HFA) 108 (90 BASE) MCG/ACT inhaler Inhale 2 puffs into the lungs every 6 (six) hours as needed for wheezing. 11/27/12  Yes Randall An, MD  Alum & Mag Hydroxide-Simeth (MAGIC MOUTHWASH) SOLN Take 5 mLs by mouth 4 (four) times daily. 12/13/12  Yes Randall An, MD  amLODipine-valsartan (EXFORGE) 5-160 MG per tablet Take 1 tablet by mouth daily. 04/10/13  Yes Ellouise Newer, PA-C  aspirin 81 MG tablet Take 81 mg by mouth daily.     Yes Historical Provider, MD  HYDROcodone-acetaminophen (NORCO) 10-325 MG per tablet Take 1 tablet by mouth every 6 (six) hours as needed. FOR PAIN 06/05/13  Yes Maurine Minister Kefalas, PA-C  LORazepam (ATIVAN) 1 MG tablet Take 1 tablet (1 mg total) by mouth every 4 (four) hours as needed (for nausea and vomiting). Nausea/vomiting 09/18/12  Yes Ellouise Newer, PA-C  metFORMIN (GLUCOPHAGE) 500 MG tablet Take 1 tablet (500 mg total) by mouth 2 (two) times daily with a meal. 04/10/13 04/10/14 Yes Maurine Minister Kefalas, PA-C  Multiple Vitamins-Minerals (ONE-A-DAY 50 PLUS PO) Take 1 tablet by mouth daily.    Yes Historical Provider, MD  omeprazole (PRILOSEC) 20 MG capsule Take 1 capsule (20 mg total) by mouth 2 (two) times daily. 05/28/13 05/28/14 Yes Thomas S Kefalas, PA-C  ondansetron (ZOFRAN) 8 MG tablet Take 1 tablet (8 mg total) by mouth 2 (two) times daily as needed for nausea (vomiting). 05/28/13  Yes Maurine Minister Kefalas, PA-C  potassium chloride (K-DUR) 10 MEQ tablet Take 2 tablets (20 mEq total) by mouth daily. 03/10/13  Yes Ellouise Newer, PA-C  regorafenib (STIVARGA) 40 MG tablet Take 80-120 mg by mouth daily with breakfast. Patient alternates taking three tablets with taking two tablets daily.Take with low fat meal. Caution:  Chemotherapy.   Yes Historical Provider, MD  temazepam (RESTORIL) 15 MG capsule Take 1 or 2 capsules as needed for sleep nightly 04/10/13  Yes Ellouise Newer, PA-C    Scheduled Meds: . aspirin  300 mg Rectal Daily   Or  . aspirin  325 mg Oral Daily  . enoxaparin (LOVENOX) injection  40 mg Subcutaneous Q24H  . insulin aspart  0-9 Units Subcutaneous TID WC  . pantoprazole  40 mg Oral Daily  . potassium chloride  20 mEq Oral Daily  . regorafenib  80-120 mg Oral Q breakfast   Continuous Infusions:  PRN Meds:.albuterol, LORazepam, senna-docusate, temazepam   Blood pressure 113/76, pulse 91, temperature 98.5 F (36.9 C), temperature source Oral, resp. rate 18, height 5' (1.524 m), weight 78 kg (171 lb 15.3 oz), SpO2 100.00%.   Results for orders placed during the hospital encounter of 06/26/13 (from the past 48 hour(s))  CBC WITH DIFFERENTIAL     Status: Abnormal   Collection Time    06/26/13  5:15 PM      Result Value Range   WBC 6.7  4.0 -  10.5 K/uL   RBC 3.37 (*) 3.87 - 5.11 MIL/uL   Hemoglobin 10.0 (*) 12.0 - 15.0 g/dL   HCT 16.1 (*) 09.6 - 04.5 %   MCV 92.3  78.0 - 100.0 fL   MCH 29.7  26.0 - 34.0 pg   MCHC 32.2  30.0 - 36.0 g/dL   RDW 40.9  81.1 - 91.4 %   Platelets 194  150 - 400 K/uL   Neutrophils Relative % 62  43 - 77 %   Neutro Abs 4.1  1.7 - 7.7 K/uL   Lymphocytes Relative 24  12 - 46 %   Lymphs Abs 1.6  0.7 - 4.0 K/uL   Monocytes Relative 13 (*) 3 - 12 %   Monocytes Absolute 0.9  0.1 - 1.0 K/uL   Eosinophils Relative 1  0 - 5 %   Eosinophils Absolute 0.1  0.0 - 0.7 K/uL   Basophils Relative 0  0 - 1 %   Basophils Absolute 0.0  0.0 - 0.1 K/uL  BASIC METABOLIC PANEL     Status: Abnormal   Collection Time    06/26/13  5:15 PM      Result Value Range   Sodium 138  135 - 145 mEq/L   Potassium 4.1  3.5 - 5.1 mEq/L   Chloride 103  96 - 112 mEq/L   CO2 24  19 - 32 mEq/L   Glucose, Bld 105 (*) 70 - 99 mg/dL   BUN 11  6 - 23 mg/dL   Creatinine, Ser 7.82  0.50 -  1.10 mg/dL   Calcium 9.2  8.4 - 95.6 mg/dL   GFR calc non Af Amer 62 (*) >90 mL/min   GFR calc Af Amer 72 (*) >90 mL/min   Comment: (NOTE)     The eGFR has been calculated using the CKD EPI equation.     This calculation has not been validated in all clinical situations.     eGFR's persistently <90 mL/min signify possible Chronic Kidney     Disease.  GLUCOSE, CAPILLARY     Status: None   Collection Time    06/26/13 11:23 PM      Result Value Range   Glucose-Capillary 76  70 - 99 mg/dL  LIPID PANEL     Status: Abnormal   Collection Time    06/27/13  5:20 AM      Result Value Range   Cholesterol 157  0 - 200 mg/dL   Triglycerides 213 (*) <150 mg/dL   HDL 34 (*) >08 mg/dL   Total CHOL/HDL Ratio 4.6     VLDL 32  0 - 40 mg/dL   LDL Cholesterol 91  0 - 99 mg/dL   Comment:            Total Cholesterol/HDL:CHD Risk     Coronary Heart Disease Risk Table                         Men   Women      1/2 Average Risk   3.4   3.3      Average Risk       5.0   4.4      2 X Average Risk   9.6   7.1      3 X Average Risk  23.4   11.0                Use the calculated Patient Ratio  above and the CHD Risk Table     to determine the patient's CHD Risk.                ATP III CLASSIFICATION (LDL):      <100     mg/dL   Optimal      829-562  mg/dL   Near or Above                        Optimal      130-159  mg/dL   Borderline      130-865  mg/dL   High      >784     mg/dL   Very High  GLUCOSE, CAPILLARY     Status: None   Collection Time    06/27/13  7:47 AM      Result Value Range   Glucose-Capillary 84  70 - 99 mg/dL    Dg Chest 2 View  69/62/9528   CLINICAL DATA:  Stroke today with right-sided weakness. History of metastatic colon cancer. Nonsmoker.  EXAM: CHEST  2 VIEW  COMPARISON:  CT chest 12/31/2012  FINDINGS: Are partite right central venous catheter with tip over the mid SVC region. No pneumothorax. Borderline heart size with normal pulmonary vascularity. No focal airspace  disease in the lungs. No blunting of costophrenic angles. Mediastinal contours appear intact. Degenerative changes in the spine. Surgical clips in the upper abdomen.  IMPRESSION: No active cardiopulmonary disease.   Electronically Signed   By: Burman Nieves M.D.   On: 06/26/2013 23:56   Ct Head Wo Contrast  06/26/2013   CLINICAL DATA:  Hypertension, metastatic colon cancer, numbness  EXAM: CT HEAD WITHOUT CONTRAST  TECHNIQUE: Contiguous axial images were obtained from the base of the skull through the vertex without intravenous contrast.  COMPARISON:  None.  FINDINGS: No acute intracranial hemorrhage, definite mass lesion, mass effect, focal edema, acute infarction, midline shift, herniation, hydrocephalus, or extra-axial fluid collection. Normal gray-white matter differentiation. Dense pineal calcification noted. Cisterns are patent. No cerebellar abnormality. Orbits are symmetric. Mastoids and sinuses visualized are clear. No skull abnormality.  IMPRESSION: No acute intracranial finding by noncontrast CT.   Electronically Signed   By: Ruel Favors M.D.   On: 06/26/2013 16:43   Mr Brain Wo Contrast  06/26/2013   CLINICAL DATA:  Right facial numbness. Right arm and hand numbness. History of colon cancer.  EXAM: MRI HEAD WITHOUT CONTRAST  TECHNIQUE: Multiplanar, multisequence MR imaging was performed. No intravenous contrast was administered.  COMPARISON:  06/26/2013 head CT.  FINDINGS: Exam is motion degraded. Patient received sedation.  Acute small nonhemorrhagic infarct at the junction of the left thalamus and posterior limb of the left internal capsule.  No intracranial hemorrhage.  Remote tiny right cerebellar infarcts.  Mild to moderate small vessel disease type changes.  Major intracranial vascular structures appear patent.  Exophthalmos.  IMPRESSION: Acute small nonhemorrhagic infarct at the junction of the left thalamus and posterior limb of the left internal capsule.  These results were called  by telephone at the time of interpretation on 06/26/2013 at 7:32 PM to Dr. Bethann Berkshire , who verbally acknowledged these results.   Electronically Signed   By: Bridgett Larsson M.D.   On: 06/26/2013 19:33        Sheily Lineman A. Gerilyn Pilgrim, M.D.  Diplomate, Biomedical engineer of Psychiatry and Neurology ( Neurology). 06/27/2013, 8:55 AM

## 2013-06-27 NOTE — Discharge Summary (Signed)
Physician Discharge Summary  Yolanda Friedman ZOX:[id number] DOB: 1951-03-21 DOA: 06/26/2013  PCP: [name], NP  Admit date: 06/26/2013 Discharge date: 06/27/2013  Time spent: 40 minutes  Recommendations for Outpatient Follow-up:  1. Follow up with primary care physician in 1 -2 weeks  Discharge Diagnoses:  Principal Problem:   CVA (cerebral infarction) Active Problems:   Metastatic recurrent adenocarcinoma of colon   HTN (hypertension)   Anemia   Discharge Condition: improved  Diet recommendation: low salt, low carb  Filed Weights   06/26/13 2156  Weight: 78 kg (171 lb 15.3 oz)    History of present illness:  62 year old female with a history of metastatic adenocarcinoma of colon currently on chemotherapy with Regorafenib, hypertension, diabetes mellitus who came to the hospital after patient had episode of numbness of the right hand and face which started around 3:30 PM when patient was walking in a store. Patient says that the numbness lasted up to a few minutes ago around 9 PM. At this time she is not complaining of numbness or weakness. Patient did not have significant weakness of right upper extremity, she did not have slurred speech, no blurred vision, denies passing out, no seizure. In the ED CT scan of the head showed no acute intracranial finding, MRI brain was done which showed acute small non-is infarct at the junction of the left thalamus and posterior limb of the left internal capsule. Patient has been taking baby aspirin at home. No previous history of stroke or seizures   Hospital Course:  This patient was admitted to the hospital with numbness of the right hand and face. She presented to the emergency room or MRI of the brain was done and indicated a small nonhemorrhagic infarct at the junction of the left thalamus and posterior limb of the left internal capsule. Patient was admitted for further evaluation and treatment. Remainder of the stroke workup was  relatively unremarkable. MRA/carotid Dopplers did not show any significant stenosis. 2-D echocardiogram was also unremarkable. LDL be less than 100. A1c is currently pending, but blood sugars have been well-controlled in the hospital. She was seen by neurology who recommended the patient take both aspirin and Plavix for the next 3 months. After these 3 months or complete, she can discontinue Plavix and just continue on aspirin. The patient is feeling significantly improved. She's not had any further symptoms and is requesting discharge home. She's been seen by physical therapy and speech therapy and has not been recommended to continue any outpatient therapy.  Procedures:  none  Consultations:  neurology  Discharge Exam: Filed Vitals:   06/27/13 1600  BP:   Pulse:   Temp: 97.9 F (36.6 C)  Resp:     General: NAD Cardiovascular: S1, S2 RRR Respiratory: CTA B  Discharge Instructions  Discharge Orders   Future Appointments Provider Department Dept Phone   07/14/2013 1:00 PM [facility] Chair [facility] [phone]   07/14/2013 1:00 PM [facility] Covering Provider [facility] [phone]   Future Orders Complete By Expires   Diet - low sodium heart healthy  As directed    Diet Carb Modified  As directed    Increase activity slowly  As directed        Medication List         albuterol 108 (90 BASE) MCG/ACT inhaler  Commonly known as:  PROVENTIL HFA;VENTOLIN HFA  Inhale 2 puffs into the lungs every 6 (six) hours as needed for wheezing.  amLODipine-valsartan 5-160 MG per tablet  Commonly known as:  EXFORGE  Take 1 tablet by mouth daily.     aspirin 325 MG tablet  Take 1 tablet (325 mg total) by mouth daily.     clopidogrel 75 MG tablet  Commonly known as:  PLAVIX  Take 1 tablet (75 mg total) by mouth daily.     HYDROcodone-acetaminophen 10-325 MG per tablet  Commonly known as:  NORCO  Take 1 tablet by mouth every 6 (six) hours as needed.  FOR PAIN     LORazepam 1 MG tablet  Commonly known as:  ATIVAN  Take 1 tablet (1 mg total) by mouth every 4 (four) hours as needed (for nausea and vomiting). Nausea/vomiting     magic mouthwash Soln  Take 5 mLs by mouth 4 (four) times daily.     metFORMIN 500 MG tablet  Commonly known as:  GLUCOPHAGE  Take 1 tablet (500 mg total) by mouth 2 (two) times daily with a meal.     omeprazole 20 MG capsule  Commonly known as:  PRILOSEC  Take 1 capsule (20 mg total) by mouth 2 (two) times daily.     ondansetron 8 MG tablet  Commonly known as:  ZOFRAN  Take 1 tablet (8 mg total) by mouth 2 (two) times daily as needed for nausea (vomiting).     ONE-A-DAY 50 PLUS PO  Take 1 tablet by mouth daily.     potassium chloride 10 MEQ tablet  Commonly known as:  K-DUR  Take 2 tablets (20 mEq total) by mouth daily.     STIVARGA 40 MG tablet  Generic drug:  regorafenib  Take 80-120 mg by mouth daily with breakfast. Patient alternates taking three tablets with taking two tablets daily.Take with low fat meal. Caution: Chemotherapy.     temazepam 15 MG capsule  Commonly known as:  RESTORIL  Take 1 or 2 capsules as needed for sleep nightly       No Known Allergies     Follow-up Information   Follow up with primary care doctor in 1-2 weeks.       The results of significant diagnostics from this hospitalization (including imaging, microbiology, ancillary and laboratory) are listed below for reference.    Significant Diagnostic Studies: Dg Chest 2 View  06/26/2013   CLINICAL DATA:  Stroke today with right-sided weakness. History of metastatic colon cancer. Nonsmoker.  EXAM: CHEST  2 VIEW  COMPARISON:  CT chest 12/31/2012  FINDINGS: Are partite right central venous catheter with tip over the mid SVC region. No pneumothorax. Borderline heart size with normal pulmonary vascularity. No focal airspace disease in the lungs. No blunting of costophrenic angles. Mediastinal contours appear intact.  Degenerative changes in the spine. Surgical clips in the upper abdomen.  IMPRESSION: No active cardiopulmonary disease.   Electronically Signed   By: Burman Nieves M.D.   On: 06/26/2013 23:56   Ct Head Wo Contrast  06/26/2013   CLINICAL DATA:  Hypertension, metastatic colon cancer, numbness  EXAM: CT HEAD WITHOUT CONTRAST  TECHNIQUE: Contiguous axial images were obtained from the base of the skull through the vertex without intravenous contrast.  COMPARISON:  None.  FINDINGS: No acute intracranial hemorrhage, definite mass lesion, mass effect, focal edema, acute infarction, midline shift, herniation, hydrocephalus, or extra-axial fluid collection. Normal gray-white matter differentiation. Dense pineal calcification noted. Cisterns are patent. No cerebellar abnormality. Orbits are symmetric. Mastoids and sinuses visualized are clear. No skull abnormality.  IMPRESSION: No acute intracranial finding  by noncontrast CT.   Electronically Signed   By: Ruel Favors M.D.   On: 06/26/2013 16:43   Mr Maxine Glenn Head Wo Contrast  06/27/2013   CLINICAL DATA:  Left thalamic/posterior limb left internal capsule infarct.  EXAM: MRA HEAD WITHOUT CONTRAST  TECHNIQUE: MRA HEAD WITHOUT CONTRAST  COMPARISON:  06/26/2013 brain MR.  FINDINGS: Anterior circulation without medium or large size vessel significant stenosis or occlusion.  Probable artifact causing appearance of high grade focal stenosis at the junction of the A1 and A2 segment of the left anterior cerebral artery.  Possible artifact causing appearance of narrowing at the proximal petrous genu of the internal carotid artery.  No significant stenosis of the vertebral arteries or basilar artery.  Mild branch vessel irregularity anterior and posterior circulation.  No aneurysm detected.  IMPRESSION: No medium or large size vessel significant stenosis or occlusion as detailed above.   Electronically Signed   By: Bridgett Larsson M.D.   On: 06/27/2013 15:22   Mr Brain Wo  Contrast  06/26/2013   CLINICAL DATA:  Right facial numbness. Right arm and hand numbness. History of colon cancer.  EXAM: MRI HEAD WITHOUT CONTRAST  TECHNIQUE: Multiplanar, multisequence MR imaging was performed. No intravenous contrast was administered.  COMPARISON:  06/26/2013 head CT.  FINDINGS: Exam is motion degraded. Patient received sedation.  Acute small nonhemorrhagic infarct at the junction of the left thalamus and posterior limb of the left internal capsule.  No intracranial hemorrhage.  Remote tiny right cerebellar infarcts.  Mild to moderate small vessel disease type changes.  Major intracranial vascular structures appear patent.  Exophthalmos.  IMPRESSION: Acute small nonhemorrhagic infarct at the junction of the left thalamus and posterior limb of the left internal capsule.  These results were called by telephone at the time of interpretation on 06/26/2013 at 7:32 PM to Dr. Bethann Berkshire , who verbally acknowledged these results.   Electronically Signed   By: Bridgett Larsson M.D.   On: 06/26/2013 19:33   US Carotid Duplex Bilateral  06/27/2013   CLINICAL DATA:  Cerebrovascular accident.  EXAM: BILATERAL CAROTID DUPLEX ULTRASOUND  TECHNIQUE: Wallace Cullens scale imaging, color Doppler and duplex ultrasound were performed of bilateral carotid and vertebral arteries in the neck.  COMPARISON:  None.  FINDINGS: Criteria: Quantification of carotid stenosis is based on velocity parameters that correlate the residual internal carotid diameter with NASCET-based stenosis levels, using the diameter of the distal internal carotid lumen as the denominator for stenosis measurement.  The following velocity measurements were obtained:  RIGHT  ICA:  97/31 cm/sec  CCA:  67/17 cm/sec  SYSTOLIC ICA/CCA RATIO:  1.45  DIASTOLIC ICA/CCA RATIO:  1.87  ECA:  65 cm/sec  LEFT  ICA:  70/27 cm/sec  CCA:  88/20 cm/sec  SYSTOLIC ICA/CCA RATIO:  0.8  DIASTOLIC ICA/CCA RATIO:  1.39  ECA:  76 cm/sec  RIGHT CAROTID ARTERY: Mild calcified  plaque formation is noted in the midportion of the right common carotid artery. Mild irregular calcified plaque formation is noted in the proximal right internal carotid artery. This is consistent with less than 50% diameter stenosis based on Doppler criteria.  RIGHT VERTEBRAL ARTERY:  Antegrade flow is noted.  LEFT CAROTID ARTERY: Minimal plaque formation is noted in the distal left common carotid artery . Mild calcified plaque formation is noted in the proximal left internal carotid artery. This is consistent with less than 50% diameter stenosis based on Doppler criteria.  LEFT VERTEBRAL ARTERY:  Antegrade flow is noted.  IMPRESSION:  Mild calcified plaque formation is noted in both proximal internal carotid arteries consistent with less than 50% diameter stenosis based on Doppler criteria.   Electronically Signed   By: Roque Lias M.D.   On: 06/27/2013 14:43    Microbiology: No results found for this or any previous visit (from the past 240 hour(s)).   Labs: Basic Metabolic Panel:  Recent Labs Lab 06/26/13 1715  NA 138  K 4.1  CL 103  CO2 24  GLUCOSE 105*  BUN 11  CREATININE 0.95  CALCIUM 9.2   Liver Function Tests: No results found for this basename: AST, ALT, ALKPHOS, BILITOT, PROT, ALBUMIN,  in the last 168 hours No results found for this basename: LIPASE, AMYLASE,  in the last 168 hours No results found for this basename: AMMONIA,  in the last 168 hours CBC:  Recent Labs Lab 06/26/13 1715  WBC 6.7  NEUTROABS 4.1  HGB 10.0*  HCT 31.1*  MCV 92.3  PLT 194   Cardiac Enzymes: No results found for this basename: CKTOTAL, CKMB, CKMBINDEX, TROPONINI,  in the last 168 hours BNP: BNP (last 3 results) No results found for this basename: PROBNP,  in the last 8760 hours CBG:  Recent Labs Lab 06/26/13 2323 06/27/13 0747 06/27/13 1145  GLUCAP 76 84 78       Signed:  MEMON,JEHANZEB  [facility] 06/27/2013, 5:09 PM

## 2013-06-27 NOTE — Progress Notes (Signed)
Stroke packet information given to family from exit care.

## 2013-06-27 NOTE — Evaluation (Signed)
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  She experienced facial and right sided numbness which is mostly resolved today.  She does have a hx of metatstatic colon CA and lives with her family independently.  Clinical Impression   Pt was evaluated and found to have no mobility problems.  Her only residual sx is that of mild RUE tingling.    PT Assessment  Patent does not need any further PT services    Follow Up Recommendations  No PT follow up    Does the patient have the potential to tolerate intense rehabilitation      Barriers to Discharge        Equipment Recommendations  None recommended by PT    Recommendations for Other Services     Frequency      Precautions / Restrictions Precautions Precautions: None Restrictions Weight Bearing Restrictions: No   Pertinent Vitals/Pain       Mobility  Bed Mobility Bed Mobility: Supine to Sit Supine to Sit: 7: Independent Transfers Transfers: Sit to Stand;Stand to Sit Sit to Stand: 7: Independent Stand to Sit: 7: Independent Ambulation/Gait Ambulation/Gait Assistance: 7: Independent Ambulation Distance (Feet): 200 Feet Assistive device: None Gait Pattern: Within Functional Limits Gait velocity: WNL Stairs: No Wheelchair Mobility Wheelchair Mobility: No Modified Rankin (Stroke Patients Only) Pre-Morbid Rankin Score: No symptoms Modified Rankin: No significant disability    Exercises     PT Diagnosis:    PT Problem List:   PT Treatment Interventions:       PT Goals(Current goals can be found in the care plan section) Acute Rehab PT Goals PT Goal Formulation: No goals set, d/c therapy  Visit Information  Last PT  Received On: 06/27/13 History of Present Illness: Pt is admitted with  a stroke in the left thalamic and posterior limb of the internal capsule.  She experienced facial and right sided numbness which is mostly resolved today.  She does have a hx of metatstatic colon CA and lives with her family independently.       Prior Functioning  Home Living Family/patient expects to be discharged to:: Private residence Living Arrangements: Children Available Help at Discharge: Family;Available PRN/intermittently Type of Home: House Home Access: Stairs to enter [employer] of Steps: 2 Entrance Stairs-Rails: None Home Layout: One level Home Equipment: None Prior Function Level of Independence: Independent Communication Communication: No difficulties    Cognition  Cognition Arousal/Alertness: Awake/alert Behavior During Therapy: WFL for tasks assessed/performed Overall Cognitive Status: Within Functional Limits for tasks assessed    Extremity/Trunk Assessment Lower Extremity Assessment Lower Extremity Assessment: Overall WFL for tasks assessed   Balance Balance Balance Assessed: Yes High Level Balance High Level Balance Activites: Side stepping;Backward walking;Direction changes;Turns;Sudden stops;Head turns High Level Balance Comments: no LOB  End of Session PT - End of Session Equipment Utilized During Treatment: Gait belt Activity Tolerance: Patient tolerated treatment well Patient left: in bed;with family/visitor present;with call bell/phone within reach Nurse Communication: Mobility status  GP     Konrad Penta 06/27/2013, 9:28 AM

## 2013-06-27 NOTE — Progress Notes (Signed)
Initial screen preformed on 06/26/13 @ 2315 by Gean Quint RN

## 2013-06-27 NOTE — Evaluation (Signed)
Speech Language Pathology Evaluation Patient Details Name: Yolanda Friedman MRN: [id number] DOB: [birth year] Today's Date: 06/27/2013 Time: 4098-1191 SLP Time Calculation (min): 15 min  Problem List:  Patient Active Problem List   Diagnosis Date Noted  . CVA (cerebral infarction) 06/26/2013  . Anemia 06/26/2013  . Nausea 12/27/2011  . HTN (hypertension) 12/22/2011  . Metastatic recurrent adenocarcinoma of colon 03/13/2011   Past Medical History:  Past Medical History  Diagnosis Date  . Hypertension   . Cancer   . Colon cancer 2005    metastatic 2012  . 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 Diabetes mellitus   . HTN (hypertension) 12/22/2011  . Shortness of breath   . GERD (gastroesophageal reflux disease)   . Anxiety    Past Surgical History:  Past Surgical History  Procedure Laterality Date  . Cholecystectomy  1987  . Colectomy  2005    colon ca  . Abdominal hysterectomy  12/20/2010    met colon ca  . Portacath placement  01/2011  . Cataract extraction w/phaco  08/08/2012    Procedure: CATARACT EXTRACTION PHACO AND INTRAOCULAR LENS PLACEMENT (IOC);  Surgeon: Gemma Payor, MD;  Location: [facility];  Service: Ophthalmology;  Laterality: Right;  CDE:16.18  . Cataract extraction w/phaco  08/26/2012    Procedure: CATARACT EXTRACTION PHACO AND INTRAOCULAR LENS PLACEMENT (IOC);  Surgeon: Gemma Payor, MD;  Location: [facility];  Service: Ophthalmology;  Laterality: Left;  CDE 7.64   HPI:  Yolanda Friedman is a 62 y.o. female who presents to the Emergency Department complaining of paresthesias to the right arm and bilateral lips described as a burning, tingling feeling. She states that she is currently getting chemotherapy for metastatic colon cancer. She reports that she was off of the chemo for 7 days but recently started back within the past 3 days. She states that she cannot remember the exact onset of the symptoms but states that they started after the chemo. She reports  that she has had 3 operations for her colon CA, the first one occuring in 2005 and the last one in 2012. She denies any prior episodes of similar symptoms. She denies any trouble speaking or confusion as associated symptoms.    Assessment / Plan / Recommendation Clinical Impression  Pt seen at bedside for speech/language evaluation. Pt pleasant and cooperative. Pt able to follow multi-step commands, demonstrate adequate LTM, STM, and immediate memory abilities, problem solve, and orient self without difficulty. Pt also demonstrated WNL pragmatic abilities. No further SLP services recommended at this time.      SLP Assessment  Patient does not need any further Speech Lanaguage Pathology Services    Follow Up Recommendations  None    Frequency and Duration        Pertinent Vitals/Pain No pain reported   SLP Goals   N/A  SLP Evaluation Prior Functioning  Type of Home: House Available Help at Discharge: Family;Available PRN/intermittently   Cognition  Overall Cognitive Status: Within Functional Limits for tasks assessed Arousal/Alertness: Awake/alert Orientation Level: Oriented X4 Memory: Appears intact Awareness: Appears intact Problem Solving: Appears intact Safety/Judgment: Appears intact    Comprehension  Auditory Comprehension Overall Auditory Comprehension: Appears within functional limits for tasks assessed Yes/No Questions: Within Functional Limits Commands: Within Functional Limits Visual Recognition/Discrimination Discrimination: Within Function Limits Reading Comprehension Reading Status: Within funtional limits    Expression Expression Primary Mode of Expression: Verbal Verbal Expression Overall Verbal Expression: Appears within functional limits for tasks assessed Initiation: No impairment Repetition:  No impairment Naming: No impairment Pragmatics: No impairment   Oral / Motor Oral Motor/Sensory Function Overall Oral Motor/Sensory Function: Appears within  functional limits for tasks assessed Motor Speech Overall Motor Speech: Appears within functional limits for tasks assessed   GO     Misty Rago S 06/27/2013, 10:03 AM

## 2013-06-27 NOTE — Progress Notes (Signed)
*  PRELIMINARY RESULTS* Echocardiogram 2D Echocardiogram has been performed.  Yolanda Friedman 06/27/2013, 12:02 PM

## 2013-06-27 NOTE — Progress Notes (Signed)
Patient discharged home.  No prior PCP, but given list to choose from to make f/u appt.  Patient states she will call Monday to make appt and will f/u with CM here.  Instructed on new medications and how to take.  Educated on prevention of further stroke and care at home.  Patient verbalizes understanding.  Educational hand outs given to patient to take home.  Port deaccessed earlier this afternoon.  WNL.  Declines offer to sign up for mychart.  Patient has no questions at this time - stable to DC.  Left floor via WC with NT

## 2013-06-27 NOTE — Care Management Note (Signed)
    Page 1 of 1   06/27/2013     4:38:09 PM   CARE MANAGEMENT NOTE 06/27/2013  Patient:  Yolanda Friedman, Yolanda Friedman   Account Number:  [id number]  Date Initiated:  06/27/2013  Documentation initiated by:  Rosemary Holms  Subjective/Objective Assessment:   Pt admitted with CVA. Lives at home. Has no PCP. To return home.     Action/Plan:   Anticipated DC Date:  06/27/2013   Anticipated DC Plan:  HOME/SELF CARE      DC Planning Services  CM consult      Choice offered to / List presented to:             Status of service:  Completed, signed off Medicare Important Message given?   (If response is "NO", the following Medicare IM given date fields will be blank) Date Medicare IM given:   Date Additional Medicare IM given:    Discharge Disposition:  HOME/SELF CARE  Per UR Regulation:    If discussed at Long Length of Stay Meetings, dates discussed:    Comments:  06/27/13 Rosemary Holms RN BSN CM 16:30 CM gave her business card to pt with a list of four PCP offices' to call on Monday to set up an appt. Instructed pt it is very important for her to get an appt next week and have her diabetes managed. Pt instructed that if she is unsuccessful in getting a MD to call CM.

## 2013-06-27 NOTE — Progress Notes (Signed)
Initial visit offering emotional and spiritual support.  

## 2013-06-30 ENCOUNTER — Other Ambulatory Visit (HOSPITAL_COMMUNITY): Payer: Self-pay | Admitting: Oncology

## 2013-06-30 ENCOUNTER — Encounter (HOSPITAL_COMMUNITY): Payer: PRIVATE HEALTH INSURANCE

## 2013-06-30 DIAGNOSIS — C189 Malignant neoplasm of colon, unspecified: Secondary | ICD-10-CM

## 2013-06-30 MED ORDER — LORAZEPAM 1 MG PO TABS: 1.0000 mg | ORAL_TABLET | ORAL | Status: DC | PRN

## 2013-06-30 MED ORDER — HYDROCODONE-ACETAMINOPHEN 10-325 MG PO TABS: 1.0000 | ORAL_TABLET | Freq: Four times a day (QID) | ORAL | Status: DC | PRN

## 2013-07-01 ENCOUNTER — Encounter (HOSPITAL_BASED_OUTPATIENT_CLINIC_OR_DEPARTMENT_OTHER): Payer: PRIVATE HEALTH INSURANCE

## 2013-07-01 ENCOUNTER — Other Ambulatory Visit (HOSPITAL_COMMUNITY): Payer: Self-pay | Admitting: Oncology

## 2013-07-01 ENCOUNTER — Telehealth (HOSPITAL_COMMUNITY): Payer: Self-pay | Admitting: *Deleted

## 2013-07-01 ENCOUNTER — Ambulatory Visit (HOSPITAL_COMMUNITY): Payer: PRIVATE HEALTH INSURANCE

## 2013-07-01 ENCOUNTER — Encounter (HOSPITAL_COMMUNITY): Payer: PRIVATE HEALTH INSURANCE

## 2013-07-01 DIAGNOSIS — Z23 Encounter for immunization: Secondary | ICD-10-CM

## 2013-07-01 DIAGNOSIS — Z0000 Encounter for general adult medical examination without abnormal findings: Secondary | ICD-10-CM

## 2013-07-01 MED ORDER — INFLUENZA VAC SPLIT QUAD 0.5 ML IM SUSP: 0.5000 mL | Freq: Once | INTRAMUSCULAR | Status: DC

## 2013-07-01 MED ORDER — INFLUENZA VAC SPLIT QUAD 0.5 ML IM SUSP
0.5000 mL | Freq: Once | INTRAMUSCULAR | Status: AC
  Administered 2013-07-01: 0.5 mL via INTRAMUSCULAR

## 2013-07-01 MED ORDER — INFLUENZA VAC SPLIT QUAD 0.5 ML IM SUSP
INTRAMUSCULAR | Status: AC
  Filled 2013-07-01: qty 0.5

## 2013-07-01 NOTE — Telephone Encounter (Signed)
Patient would like flu shot today

## 2013-07-01 NOTE — Progress Notes (Signed)
LARISSA PEGG presents today for injection per MD orders. Flu vaccine administered IM in right Upper Arm. Administration without incident. Patient tolerated well.

## 2013-07-04 ENCOUNTER — Other Ambulatory Visit (HOSPITAL_COMMUNITY): Payer: Self-pay | Admitting: Oncology

## 2013-07-04 DIAGNOSIS — C189 Malignant neoplasm of colon, unspecified: Secondary | ICD-10-CM

## 2013-07-04 MED ORDER — POTASSIUM CHLORIDE ER 10 MEQ PO TBCR: 10.0000 meq | EXTENDED_RELEASE_TABLET | Freq: Every day | ORAL | Status: DC

## 2013-07-07 ENCOUNTER — Ambulatory Visit (HOSPITAL_COMMUNITY): Payer: PRIVATE HEALTH INSURANCE

## 2013-07-07 ENCOUNTER — Other Ambulatory Visit (HOSPITAL_COMMUNITY): Payer: PRIVATE HEALTH INSURANCE

## 2013-07-11 ENCOUNTER — Telehealth (HOSPITAL_COMMUNITY): Payer: Self-pay | Admitting: *Deleted

## 2013-07-11 NOTE — Telephone Encounter (Signed)
.  Yolanda Friedman called reporting that she has an infected foot and has been seen by a foot Dr. Who put her on antibiotic. She has had severe diarrhea with the antibiotic, the podiatrist has now called telling her that he is calling in a different antibiotic and to stop the first one. She wanted to know if she could wait til Monday when she sees oncology to start on another antibiotic. I advised her to start antibiotic and to use imodium for diarrhea.

## 2013-07-11 NOTE — Telephone Encounter (Signed)
Patient notified

## 2013-07-11 NOTE — Telephone Encounter (Signed)
Message copied by Adelene Amas on Fri Jul 11, 2013  3:40 PM ------      Message from: Alla German A      Created: Fri Jul 11, 2013  1:32 PM       Lilyonna Steidle:  Also advise her to eat 3 helpings of yogurt daily to replenish normal flora.  If she has vaginal discharge or sore mouth as well, she should go on Diflucan which I would be glad to order.  Dr.F ------

## 2013-07-14 ENCOUNTER — Encounter (HOSPITAL_COMMUNITY): Payer: PRIVATE HEALTH INSURANCE | Attending: Oncology

## 2013-07-14 ENCOUNTER — Encounter (HOSPITAL_COMMUNITY): Payer: PRIVATE HEALTH INSURANCE

## 2013-07-14 ENCOUNTER — Encounter (HOSPITAL_COMMUNITY): Payer: Self-pay

## 2013-07-14 VITALS — BP 147/83 | HR 77 | Temp 98.1°F | Resp 18 | Wt 166.9 lb

## 2013-07-14 DIAGNOSIS — C189 Malignant neoplasm of colon, unspecified: Secondary | ICD-10-CM

## 2013-07-14 LAB — CBC WITH DIFFERENTIAL/PLATELET
Basophils Absolute: 0 10*3/uL (ref 0.0–0.1)
Basophils Relative: 0 % (ref 0–1)
Eosinophils Absolute: 0.1 10*3/uL (ref 0.0–0.7)
Eosinophils Relative: 2 % (ref 0–5)
HCT: 35.3 % — ABNORMAL LOW (ref 36.0–46.0)
Lymphocytes Relative: 37 % (ref 12–46)
MCH: 29.7 pg (ref 26.0–34.0)
MCHC: 32 g/dL (ref 30.0–36.0)
MCV: 92.9 fL (ref 78.0–100.0)
Monocytes Absolute: 0.4 10*3/uL (ref 0.1–1.0)
Monocytes Relative: 8 % (ref 3–12)
RDW: 16.1 % — ABNORMAL HIGH (ref 11.5–15.5)

## 2013-07-14 LAB — COMPREHENSIVE METABOLIC PANEL
AST: 38 U/L — ABNORMAL HIGH (ref 0–37)
CO2: 23 mEq/L (ref 19–32)
Calcium: 9.4 mg/dL (ref 8.4–10.5)
Creatinine, Ser: 0.81 mg/dL (ref 0.50–1.10)
GFR calc Af Amer: 88 mL/min — ABNORMAL LOW (ref >90)
GFR calc non Af Amer: 76 mL/min — ABNORMAL LOW (ref >90)
Glucose, Bld: 95 mg/dL (ref 70–99)
Total Protein: 6.9 g/dL (ref 6.0–8.3)

## 2013-07-14 MED ORDER — HEPARIN SOD (PORK) LOCK FLUSH 100 UNIT/ML IV SOLN
500.0000 [IU] | Freq: Once | INTRAVENOUS | Status: AC
  Administered 2013-07-14: 500 [IU] via INTRAVENOUS

## 2013-07-14 MED ORDER — HEPARIN SOD (PORK) LOCK FLUSH 100 UNIT/ML IV SOLN
INTRAVENOUS | Status: AC
  Filled 2013-07-14: qty 5

## 2013-07-14 MED ORDER — SODIUM CHLORIDE 0.9 % IJ SOLN
10.0000 mL | Freq: Once | INTRAMUSCULAR | Status: AC
  Administered 2013-07-14: 10 mL via INTRAVENOUS

## 2013-07-14 NOTE — Progress Notes (Signed)
Yolanda Friedman presented for Portacath access and flush. Proper placement of portacath confirmed by CXR. Portacath located rt chest wall accessed with  H 20 needle. Good blood return present. Portacath flushed with 20ml NS and 500U/5ml Heparin and needle removed intact. Procedure without incident. Patient tolerated procedure well.   

## 2013-07-14 NOTE — Progress Notes (Signed)
[facility]  OFFICE PROGRESS NOTE  [name], NP No address on file  DIAGNOSIS: Metastatic Recurrent [place]eno cancer of the Colon  CURRENT THERAPY: Stivarga (Regorafenib) 160 mg PO daily x 21 days with 7 day respite. Started ~01/27/2013. Will reduce to 120 mg 21 days on and 7 days off.  ONCOLOGICAL HISTORY:  Yolanda Friedman 62 y.o. 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 She was seen by Dr. Lattie Corns at [facility] who recommended Stivarga versus clinical trial. Patient has opted for treatment with Stivarga prior to enrolling in a study. Stivarga (Regorafenib) 160 mg PO daily x 21 days with 7 day respite. Started ~01/27/2013, CEA on 01/20/2013 was 12.1. In view of side effects, Stivarga  dose was reduced to 120 mg days 1-21 with 7 days in August, 2014.   INTERVAL HISTORY: Yolanda Friedman 62 y.o. female returns for followup. She says she is able to tolerate the reduced dose of stivarga  quite okay. She does complain of occasional nausea and also dry skin. She developed toe infection evaluated by podiatry and was started on antibiotics. She complains of am intermittent constipation and diarrhea and she says that's not new for her.  Denies any  headaches, dizziness, double vision, fevers, chills, night sweats, vomiting, chest pain, heart palpitations, shortness of breath, blood in stool, black tarry stool, urinary pain, urinary burning, urinary frequency, hematuria.  MEDICAL HISTORY: Past Medical History  Diagnosis Date  . Hypertension   . Cancer   . Colon cancer 2005    metastatic 2012  . 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 Diabetes mellitus   . HTN (hypertension) 12/22/2011  . Shortness of breath   . GERD (gastroesophageal reflux disease)   . Anxiety     ALLERGIES:  has No Known  Allergies.  MEDICATIONS:  Current Outpatient Prescriptions  Medication Sig Dispense Refill  . albuterol (PROVENTIL HFA;VENTOLIN HFA) 108 (90 BASE) MCG/ACT inhaler Inhale 2 puffs into the lungs every 6 (six) hours as needed for wheezing.  1 Inhaler  2  . Alum & Mag Hydroxide-Simeth (MAGIC MOUTHWASH) SOLN Take 5 mLs by mouth 4 (four) times daily.  240 mL  1  . amLODipine-valsartan (EXFORGE) 5-160 MG per tablet Take 1 tablet by mouth daily.  30 tablet  2  . aspirin 325 MG tablet Take 1 tablet (325 mg total) by mouth daily.  30 tablet  1  . clopidogrel (PLAVIX) 75 MG tablet Take 1 tablet (75 mg total) by mouth daily.  30 tablet  2  . diphenoxylate-atropine (LOMOTIL) 2.5-0.025 MG per tablet Take by mouth.      Marland Kitchen HYDROcodone-acetaminophen (NORCO) 10-325 MG per tablet Take 1 tablet by mouth every 6 (six) hours as needed. FOR PAIN  60 tablet  0  . LORazepam (ATIVAN) 1 MG tablet Take 1 tablet (1 mg total) by mouth every 4 (four) hours as needed (for nausea and vomiting). Nausea/vomiting  30 tablet  2  . metFORMIN (GLUCOPHAGE) 500 MG tablet Take 1 tablet (500 mg total) by mouth 2 (two) times daily with a meal.  60 tablet  2  . Multiple Vitamins-Minerals (ONE-A-DAY 50 PLUS PO) Take 1 tablet by mouth daily.       Marland Kitchen omeprazole (PRILOSEC) 20 MG capsule Take 1 capsule (20 mg total) by mouth 2 (two) times daily.  60 capsule  3  . ondansetron (ZOFRAN) 8 MG tablet Take 1 tablet (8 mg total) by mouth 2 (two) times daily as needed for nausea (vomiting).  40 tablet  3  . potassium chloride (K-DUR) 10 MEQ tablet Take 1 tablet (10 mEq total) by mouth daily.  30 tablet  1  . regorafenib (STIVARGA) 40 MG tablet Take 80-120 mg by mouth daily with breakfast. Patient alternates taking three tablets with taking two tablets daily.Take with low fat meal. Caution: Chemotherapy.      . temazepam (RESTORIL) 15 MG capsule Take 1 or 2 capsules as needed for sleep nightly  60 capsule  2  . Aspirin-Caffeine 850-65 MG PACK Take by  mouth.       No current facility-administered medications for this visit.    SURGICAL HISTORY:  Past Surgical History  Procedure Laterality Date  . Cholecystectomy  1987  . Colectomy  2005    colon ca  . Abdominal hysterectomy  12/20/2010    met colon ca  . Portacath placement  01/2011  . Cataract extraction w/phaco  08/08/2012    Procedure: CATARACT EXTRACTION PHACO AND INTRAOCULAR LENS PLACEMENT (IOC);  Surgeon: Gemma Payor, MD;  Location: [facility];  Service: Ophthalmology;  Laterality: Right;  CDE:16.18  . Cataract extraction w/phaco  08/26/2012    Procedure: CATARACT EXTRACTION PHACO AND INTRAOCULAR LENS PLACEMENT (IOC);  Surgeon: Gemma Payor, MD;  Location: [facility];  Service: Ophthalmology;  Laterality: Left;  CDE 7.64    FAMILY HISTORY: family history includes Cancer in her mother; Diabetes in her sister; Hypertension in her maternal uncle and sister.  SOCIAL HISTORY:  reports that she has never smoked. She has never used smokeless tobacco. She reports that she drinks alcohol. She reports that she uses illicit drugs (Marijuana and Solvent inhalants).  REVIEW OF SYSTEMS:  As mentioned in the history of present illness  PHYSICAL EXAMINATION: ECOG PERFORMANCE STATUS: 0 - Asymptomatic  Blood pressure 147/83, pulse 77, temperature 98.1 F (36.7 C), temperature source Oral, resp. rate 18, weight 166 lb 14.4 oz (75.705 kg).  GENERAL:alert, no distress, well nourished and well developed SKIN: no rashes or significant lesions HEAD: Normocephalic EYES: PERRLA, EOMI, Conjunctiva are pink and non-injected, sclera clear EARS: External ears normal OROPHARYNX:no erythema, lips, buccal mucosa, and tongue normal and mucous membranes are moist  NECK: supple, no adenopathy, no JVD, no stridor, non-tender LYMPH:  no palpable lymphadenopathy, no hepatosplenomegaly BREAST:breasts appear normal, no suspicious masses, no skin or nipple changes or axillary nodes LUNGS: clear to auscultation , coarse  sounds heard HEART: regular rate & rhythm ABDOMEN:abdomen soft, obese and normal bowel sounds BACK: Back symmetric, no curvature. EXTREMITIES:no edema, no clubbing and no cyanosis, toe infection -healing  NEURO: alert & oriented x 3 with fluent speech, no focal motor/sensory deficits, gait normal   LABORATORY DATA: Admission on 06/26/2013, Discharged on 06/27/2013  Component Date Value Range Status  . WBC 06/26/2013 6.7  4.0 - 10.5 K/uL Final  . RBC 06/26/2013 3.37* 3.87 - 5.11 MIL/uL Final  . Hemoglobin 06/26/2013 10.0* 12.0 - 15.0 g/dL Final  . HCT 16/06/9603 31.1* 36.0 - 46.0 % Final  . MCV 06/26/2013 92.3  78.0 - 100.0 fL Final  . MCH 06/26/2013 29.7  26.0 - 34.0 pg Final  . MCHC 06/26/2013 32.2  30.0 - 36.0 g/dL Final  . RDW 54/05/8118 15.5  11.5 - 15.5 % Final  . Platelets 06/26/2013 194  150 - 400 K/uL Final  . Neutrophils Relative % 06/26/2013 62  43 - 77 % Final  . Neutro Abs 06/26/2013 4.1  1.7 - 7.7 K/uL Final  . Lymphocytes Relative 06/26/2013 24  12 - 46 % Final  . Lymphs Abs 06/26/2013 1.6  0.7 - 4.0 K/uL Final  . Monocytes Relative 06/26/2013 13* 3 - 12 % Final  . Monocytes Absolute 06/26/2013 0.9  0.1 - 1.0 K/uL Final  . Eosinophils Relative 06/26/2013 1  0 - 5 % Final  . Eosinophils Absolute 06/26/2013 0.1  0.0 - 0.7 K/uL Final  . Basophils Relative 06/26/2013 0  0 - 1 % Final  . Basophils Absolute 06/26/2013 0.0  0.0 - 0.1 K/uL Final  . Sodium 06/26/2013 138  135 - 145 mEq/L Final  . Potassium 06/26/2013 4.1  3.5 - 5.1 mEq/L Final  . Chloride 06/26/2013 103  96 - 112 mEq/L Final  . CO2 06/26/2013 24  19 - 32 mEq/L Final  . Glucose, Bld 06/26/2013 105* 70 - 99 mg/dL Final  . BUN 86/57/8469 11  6 - 23 mg/dL Final  . Creatinine, Ser 06/26/2013 0.95  0.50 - 1.10 mg/dL Final  . Calcium 62/95/2841 9.2  8.4 - 10.5 mg/dL Final  . GFR calc non Af Amer 06/26/2013 62* >90 mL/min Final  . GFR calc Af Amer 06/26/2013 72* >90 mL/min Final   Comment: (NOTE)                           The eGFR has been calculated using the CKD EPI equation.                          This calculation has not been validated in all clinical situations.                          eGFR's persistently <90 mL/min signify possible Chronic Kidney                          Disease.  Marland Kitchen Hemoglobin A1C 06/27/2013 6.0* <5.7 % Final   Comment: (NOTE)                                                                                                                         According to the ADA Clinical Practice Recommendations for 2011, when                          HbA1c is used as a screening test:                           >=6.5%   Diagnostic of Diabetes Mellitus                                    (  if abnormal result is confirmed)                          5.7-6.4%   Increased risk of developing Diabetes Mellitus                          References:Diagnosis and Classification of Diabetes Mellitus,Diabetes                          Care,2011,34(Suppl 1):S62-S69 and Standards of Medical Care in                                  Diabetes - 2011,Diabetes Care,2011,34 (Suppl 1):S11-S61.  . Mean Plasma Glucose 06/27/2013 126* <117 mg/dL Final   Performed at [facility]  . Cholesterol 06/27/2013 157  0 - 200 mg/dL Final  . Triglycerides 06/27/2013 158* <150 mg/dL Final  . HDL 45/40/9811 34* >39 mg/dL Final  . Total CHOL/HDL Ratio 06/27/2013 4.6   Final  . VLDL 06/27/2013 32  0 - 40 mg/dL Final  . LDL Cholesterol 06/27/2013 91  0 - 99 mg/dL Final   Comment:                                 Total Cholesterol/HDL:CHD Risk                          Coronary Heart Disease Risk Table                                              Men   Women                           1/2 Average Risk   3.4   3.3                           Average Risk       5.0   4.4                           2 X Average Risk   9.6   7.1                           3 X Average Risk  23.4   11.0                                                           Use the calculated Patient Ratio                          above and the CHD Risk Table                          to  determine the patient's CHD Risk.                                                          ATP III CLASSIFICATION (LDL):                           <100     mg/dL   Optimal                           100-129  mg/dL   Near or Above                                             Optimal                           130-159  mg/dL   Borderline                           160-189  mg/dL   High                           >190     mg/dL   Very High  . Glucose-Capillary 06/26/2013 76  70 - 99 mg/dL Final  . Glucose-Capillary 06/27/2013 84  70 - 99 mg/dL Final  . Glucose-Capillary 06/27/2013 78  70 - 99 mg/dL Final  . Glucose-Capillary 06/27/2013 115* 70 - 99 mg/dL Final  Office Visit on 06/16/2013  Component Date Value Range Status  . WBC 06/16/2013 6.3  4.0 - 10.5 K/uL Final  . RBC 06/16/2013 3.74* 3.87 - 5.11 MIL/uL Final  . Hemoglobin 06/16/2013 11.3* 12.0 - 15.0 g/dL Final  . HCT 78/29/5621 34.7* 36.0 - 46.0 % Final  . MCV 06/16/2013 92.8  78.0 - 100.0 fL Final  . MCH 06/16/2013 30.2  26.0 - 34.0 pg Final  . MCHC 06/16/2013 32.6  30.0 - 36.0 g/dL Final  . RDW 30/86/5784 15.4  11.5 - 15.5 % Final  . Platelets 06/16/2013 290  150 - 400 K/uL Final  . Neutrophils Relative % 06/16/2013 65  43 - 77 % Final  . Neutro Abs 06/16/2013 4.0  1.7 - 7.7 K/uL Final  . Lymphocytes Relative 06/16/2013 25  12 - 46 % Final  . Lymphs Abs 06/16/2013 1.6  0.7 - 4.0 K/uL Final  . Monocytes Relative 06/16/2013 8  3 - 12 % Final  . Monocytes Absolute 06/16/2013 0.5  0.1 - 1.0 K/uL Final  . Eosinophils Relative 06/16/2013 2  0 - 5 % Final  . Eosinophils Absolute 06/16/2013 0.1  0.0 - 0.7 K/uL Final  . Basophils Relative 06/16/2013 0  0 - 1 % Final  . Basophils Absolute 06/16/2013 0.0  0.0 - 0.1 K/uL Final  . Sodium 06/16/2013 141  135 - 145 mEq/L Final  . Potassium 06/16/2013 4.1  3.5 - 5.1  mEq/L Final  . Chloride 06/16/2013 104  96 - 112 mEq/L Final  . CO2 06/16/2013 25  19 - 32 mEq/L Final  . Glucose, Bld  06/16/2013 94  70 - 99 mg/dL Final  . BUN 78/29/5621 11  6 - 23 mg/dL Final  . Creatinine, Ser 06/16/2013 1.03  0.50 - 1.10 mg/dL Final  . Calcium 30/86/5784 9.5  8.4 - 10.5 mg/dL Final  . Total Protein 06/16/2013 7.3  6.0 - 8.3 g/dL Final  . Albumin 69/62/9528 3.3* 3.5 - 5.2 g/dL Final  . AST 41/32/4401 37  0 - 37 U/L Final  . ALT 06/16/2013 25  0 - 35 U/L Final  . Alkaline Phosphatase 06/16/2013 73  39 - 117 U/L Final  . Total Bilirubin 06/16/2013 0.3  0.3 - 1.2 mg/dL Final  . GFR calc non Af Amer 06/16/2013 57* >90 mL/min Final  . GFR calc Af Amer 06/16/2013 66* >90 mL/min Final   Comment: (NOTE)                          The eGFR has been calculated using the CKD EPI equation.                          This calculation has not been validated in all clinical situations.                          eGFR's persistently <90 mL/min signify possible Chronic Kidney                          Disease.  . CEA 06/16/2013 10.1* 0.0 - 5.0 ng/mL Final   Performed at [facility]    PATHOLOGY:  Urinalysis    Component Value Date/Time   COLORURINE YELLOW 11/17/2010 1332   APPEARANCEUR CLEAR 11/17/2010 1332   LABSPEC 1.020 01/01/2013 0908   PHURINE 5.5 01/01/2013 0908   GLUCOSEU NEGATIVE 01/01/2013 0908   HGBUR NEGATIVE 01/01/2013 0908   BILIRUBINUR NEGATIVE 01/01/2013 0908   KETONESUR NEGATIVE 01/01/2013 0908   PROTEINUR NEGATIVE 01/01/2013 0908   UROBILINOGEN 0.2 01/01/2013 0908   NITRITE NEGATIVE 01/01/2013 0908   LEUKOCYTESUR NEGATIVE 01/01/2013 0908    RADIOGRAPHIC STUDIES: Dg Chest 2 View  06/26/2013   CLINICAL DATA:  Stroke today with right-sided weakness. History of metastatic colon cancer. Nonsmoker.  EXAM: CHEST  2 VIEW  COMPARISON:  CT chest 12/31/2012  FINDINGS: Are partite right central venous catheter with tip over the mid SVC region. No pneumothorax. Borderline  heart size with normal pulmonary vascularity. No focal airspace disease in the lungs. No blunting of costophrenic angles. Mediastinal contours appear intact. Degenerative changes in the spine. Surgical clips in the upper abdomen.  IMPRESSION: No active cardiopulmonary disease.   Electronically Signed   By: Burman Nieves M.D.   On: 06/26/2013 23:56   Ct Head Wo Contrast  06/26/2013   CLINICAL DATA:  Hypertension, metastatic colon cancer, numbness  EXAM: CT HEAD WITHOUT CONTRAST  TECHNIQUE: Contiguous axial images were obtained from the base of the skull through the vertex without intravenous contrast.  COMPARISON:  None.  FINDINGS: No acute intracranial hemorrhage, definite mass lesion, mass effect, focal edema, acute infarction, midline shift, herniation, hydrocephalus, or extra-axial fluid collection. Normal gray-white matter differentiation. Dense pineal calcification noted. Cisterns are patent. No cerebellar abnormality. Orbits are symmetric. Mastoids and sinuses visualized are clear. No skull abnormality.  IMPRESSION: No acute intracranial finding by noncontrast CT.   Electronically Signed   By: Ruel Favors M.D.   On: 06/26/2013  16:43   Mr Maxine Glenn Head Wo Contrast  06/27/2013   CLINICAL DATA:  Left thalamic/posterior limb left internal capsule infarct.  EXAM: MRA HEAD WITHOUT CONTRAST  TECHNIQUE: MRA HEAD WITHOUT CONTRAST  COMPARISON:  06/26/2013 brain MR.  FINDINGS: Anterior circulation without medium or large size vessel significant stenosis or occlusion.  Probable artifact causing appearance of high grade focal stenosis at the junction of the A1 and A2 segment of the left anterior cerebral artery.  Possible artifact causing appearance of narrowing at the proximal petrous genu of the internal carotid artery.  No significant stenosis of the vertebral arteries or basilar artery.  Mild branch vessel irregularity anterior and posterior circulation.  No aneurysm detected.  IMPRESSION: No medium or large  size vessel significant stenosis or occlusion as detailed above.   Electronically Signed   By: Bridgett Larsson M.D.   On: 06/27/2013 15:22   Mr Brain Wo Contrast  06/26/2013   CLINICAL DATA:  Right facial numbness. Right arm and hand numbness. History of colon cancer.  EXAM: MRI HEAD WITHOUT CONTRAST  TECHNIQUE: Multiplanar, multisequence MR imaging was performed. No intravenous contrast was administered.  COMPARISON:  06/26/2013 head CT.  FINDINGS: Exam is motion degraded. Patient received sedation.  Acute small nonhemorrhagic infarct at the junction of the left thalamus and posterior limb of the left internal capsule.  No intracranial hemorrhage.  Remote tiny right cerebellar infarcts.  Mild to moderate small vessel disease type changes.  Major intracranial vascular structures appear patent.  Exophthalmos.  IMPRESSION: Acute small nonhemorrhagic infarct at the junction of the left thalamus and posterior limb of the left internal capsule.  These results were called by telephone at the time of interpretation on 06/26/2013 at 7:32 PM to Dr. Bethann Berkshire , who verbally acknowledged these results.   Electronically Signed   By: Bridgett Larsson M.D.   On: 06/26/2013 19:33   US Carotid Duplex Bilateral  06/27/2013   CLINICAL DATA:  Cerebrovascular accident.  EXAM: BILATERAL CAROTID DUPLEX ULTRASOUND  TECHNIQUE: Wallace Cullens scale imaging, color Doppler and duplex ultrasound were performed of bilateral carotid and vertebral arteries in the neck.  COMPARISON:  None.  FINDINGS: Criteria: Quantification of carotid stenosis is based on velocity parameters that correlate the residual internal carotid diameter with NASCET-based stenosis levels, using the diameter of the distal internal carotid lumen as the denominator for stenosis measurement.  The following velocity measurements were obtained:  RIGHT  ICA:  97/31 cm/sec  CCA:  67/17 cm/sec  SYSTOLIC ICA/CCA RATIO:  1.45  DIASTOLIC ICA/CCA RATIO:  1.87  ECA:  65 cm/sec  LEFT  ICA:   70/27 cm/sec  CCA:  88/20 cm/sec  SYSTOLIC ICA/CCA RATIO:  0.8  DIASTOLIC ICA/CCA RATIO:  1.39  ECA:  76 cm/sec  RIGHT CAROTID ARTERY: Mild calcified plaque formation is noted in the midportion of the right common carotid artery. Mild irregular calcified plaque formation is noted in the proximal right internal carotid artery. This is consistent with less than 50% diameter stenosis based on Doppler criteria.  RIGHT VERTEBRAL ARTERY:  Antegrade flow is noted.  LEFT CAROTID ARTERY: Minimal plaque formation is noted in the distal left common carotid artery . Mild calcified plaque formation is noted in the proximal left internal carotid artery. This is consistent with less than 50% diameter stenosis based on Doppler criteria.  LEFT VERTEBRAL ARTERY:  Antegrade flow is noted.  IMPRESSION: Mild calcified plaque formation is noted in both proximal internal carotid arteries consistent with less than 50% diameter  stenosis based on Doppler criteria.   Electronically Signed   By: Roque Lias M.D.   On: 06/27/2013 14:43    ASSESSMENT:  #1 Metastatic adenocarcinoma of colon positive KRAS mutation with disease progression despite FOLFOX + Avastin followed by FOLFIRI + Avastin:  Since patient is able to tolerate the current dose of Stivarga, continue  120 mg 21 days on and 7 days off schedule. Since patient is going out of the town will arrange for the repeat CT scans next visit. Her CEA level which was performed on October 13 was 10.1 which is decreased from September which was 13.6. #2 continue antibiotics for her coinfection as recommended by podiatrician  Patient Active Problem List    Diagnosis  Date Noted   .  Nausea  12/27/2011   .  HTN (hypertension)  12/22/2011   .  Metastatic recurrent adenocarcinoma of colon  03/13/2011      PLAN:  1.  Restaging CT scans  in one month 2.. Labs every 4 weeks: CBC diff, CMET 5. Continue Stivarga 120 mg days 1-21 with 7 days.  6. Return in 4 weeks for follow-up.       All questions were answered. The patient knows to call the clinic with any problems, questions or concerns. We can certainly see the patient much sooner if necessary.   I spent 15 minutes counseling the patient face to face. The total time spent in the appointment was 25 minutes   Annamarie Dawley, MD 07/14/2013 2:52 PM

## 2013-07-15 ENCOUNTER — Other Ambulatory Visit (HOSPITAL_COMMUNITY): Payer: PRIVATE HEALTH INSURANCE

## 2013-07-22 ENCOUNTER — Other Ambulatory Visit (HOSPITAL_COMMUNITY): Payer: Self-pay | Admitting: Oncology

## 2013-07-24 ENCOUNTER — Other Ambulatory Visit (HOSPITAL_COMMUNITY): Payer: Self-pay | Admitting: Oncology

## 2013-07-24 DIAGNOSIS — C189 Malignant neoplasm of colon, unspecified: Secondary | ICD-10-CM

## 2013-07-24 MED ORDER — REGORAFENIB 40 MG PO TABS: 120.0000 mg | ORAL_TABLET | Freq: Every day | ORAL | Status: DC

## 2013-08-04 ENCOUNTER — Telehealth (HOSPITAL_COMMUNITY): Payer: Self-pay

## 2013-08-04 ENCOUNTER — Other Ambulatory Visit (HOSPITAL_COMMUNITY): Payer: Self-pay | Admitting: Oncology

## 2013-08-04 ENCOUNTER — Telehealth (HOSPITAL_COMMUNITY): Payer: Self-pay | Admitting: *Deleted

## 2013-08-04 DIAGNOSIS — I10 Essential (primary) hypertension: Secondary | ICD-10-CM

## 2013-08-04 DIAGNOSIS — C189 Malignant neoplasm of colon, unspecified: Secondary | ICD-10-CM

## 2013-08-04 MED ORDER — AMLODIPINE BESYLATE-VALSARTAN 5-160 MG PO TABS: 1.0000 | ORAL_TABLET | Freq: Every day | ORAL | Status: DC

## 2013-08-04 MED ORDER — HYDROCODONE-ACETAMINOPHEN 10-325 MG PO TABS: 1.0000 | ORAL_TABLET | Freq: Four times a day (QID) | ORAL | Status: DC | PRN

## 2013-08-04 NOTE — Telephone Encounter (Signed)
Message left on voice mail that prescription was ready for pickup.

## 2013-08-04 NOTE — Telephone Encounter (Signed)
Message copied by Evelena Leyden on Mon Aug 04, 2013  6:02 PM ------      Message from: Ellouise Newer      Created: Mon Aug 04, 2013  4:16 PM       Rx is ready and in drawer             need rx for hydrocodone call her at [phone] to let her know the rx is ready so her son can pick it up and mail it to her  ------

## 2013-08-11 ENCOUNTER — Other Ambulatory Visit (HOSPITAL_COMMUNITY): Payer: Self-pay | Admitting: Oncology

## 2013-08-29 ENCOUNTER — Other Ambulatory Visit (HOSPITAL_COMMUNITY): Payer: Self-pay | Admitting: Oncology

## 2013-09-01 ENCOUNTER — Encounter (HOSPITAL_COMMUNITY): Payer: PRIVATE HEALTH INSURANCE | Attending: Oncology

## 2013-09-01 ENCOUNTER — Encounter (HOSPITAL_COMMUNITY): Payer: PRIVATE HEALTH INSURANCE

## 2013-09-01 ENCOUNTER — Encounter (HOSPITAL_COMMUNITY): Payer: Self-pay

## 2013-09-01 VITALS — BP 153/84 | HR 93 | Temp 97.8°F | Resp 18 | Wt 167.2 lb

## 2013-09-01 DIAGNOSIS — C189 Malignant neoplasm of colon, unspecified: Secondary | ICD-10-CM | POA: Insufficient documentation

## 2013-09-01 DIAGNOSIS — C786 Secondary malignant neoplasm of retroperitoneum and peritoneum: Secondary | ICD-10-CM

## 2013-09-01 DIAGNOSIS — L270 Generalized skin eruption due to drugs and medicaments taken internally: Secondary | ICD-10-CM

## 2013-09-01 LAB — CBC WITH DIFFERENTIAL/PLATELET
Eosinophils Absolute: 0.1 10*3/uL (ref 0.0–0.7)
Eosinophils Relative: 3 % (ref 0–5)
HCT: 35.8 % — ABNORMAL LOW (ref 36.0–46.0)
Hemoglobin: 11.2 g/dL — ABNORMAL LOW (ref 12.0–15.0)
Lymphs Abs: 1.9 10*3/uL (ref 0.7–4.0)
MCH: 29.1 pg (ref 26.0–34.0)
Monocytes Absolute: 0.4 10*3/uL (ref 0.1–1.0)
Monocytes Relative: 7 % (ref 3–12)
Neutro Abs: 3.1 10*3/uL (ref 1.7–7.7)
Neutrophils Relative %: 56 % (ref 43–77)
RBC: 3.85 MIL/uL — ABNORMAL LOW (ref 3.87–5.11)

## 2013-09-01 LAB — COMPREHENSIVE METABOLIC PANEL
Alkaline Phosphatase: 62 U/L (ref 39–117)
BUN: 10 mg/dL (ref 6–23)
CO2: 26 mEq/L (ref 19–32)
Chloride: 105 mEq/L (ref 96–112)
Creatinine, Ser: 0.62 mg/dL (ref 0.50–1.10)
GFR calc Af Amer: >90 mL/min (ref >90)
GFR calc non Af Amer: >90 mL/min (ref >90)
Glucose, Bld: 110 mg/dL — ABNORMAL HIGH (ref 70–99)
Potassium: 3.6 mEq/L (ref 3.5–5.1)
Total Bilirubin: 0.2 mg/dL — ABNORMAL LOW (ref 0.3–1.2)
Total Protein: 7.1 g/dL (ref 6.0–8.3)

## 2013-09-01 MED ORDER — SODIUM CHLORIDE 0.9 % IJ SOLN
10.0000 mL | INTRAMUSCULAR | Status: DC | PRN
  Administered 2013-09-01: 10 mL via INTRAVENOUS

## 2013-09-01 MED ORDER — HEPARIN SOD (PORK) LOCK FLUSH 100 UNIT/ML IV SOLN
500.0000 [IU] | Freq: Once | INTRAVENOUS | Status: AC
  Administered 2013-09-01: 500 [IU] via INTRAVENOUS
  Filled 2013-09-01: qty 5

## 2013-09-01 NOTE — Patient Instructions (Signed)
[facility] Discharge Instructions  RECOMMENDATIONS MADE BY THE CONSULTANT AND ANY TEST RESULTS WILL BE SENT TO YOUR REFERRING PHYSICIAN.  EXAM FINDINGS BY THE PHYSICIAN TODAY AND SIGNS OR SYMPTOMS TO REPORT TO CLINIC OR PRIMARY PHYSICIAN: Exam and findings as discussed by Dr. Zigmund Daniel.  Will do CT scans and Mammogram the 3rd week of January.  Report uncontrolled nausea, vomiting, pain, etc.  MEDICATIONS PRESCRIBED:  none  INSTRUCTIONS/FOLLOW-UP: Follow-up after scans.  Thank you for choosing [facility] to provide your oncology and hematology care.  To afford each patient quality time with our providers, please arrive at least 15 minutes before your scheduled appointment time.  With your help, our goal is to use those 15 minutes to complete the necessary work-up to ensure our physicians have the information they need to help with your evaluation and healthcare recommendations.    Effective January 1st, 2014, we ask that you re-schedule your appointment with our physicians should you arrive 10 or more minutes late for your appointment.  We strive to give you quality time with our providers, and arriving late affects you and other patients whose appointments are after yours.    Again, thank you for choosing [facility].  Our hope is that these requests will decrease the amount of time that you wait before being seen by our physicians.       _____________________________________________________________  Should you have questions after your visit to [facility], please contact our office at [phone] between the hours of 8:30 a.m. and 5:00 p.m.  Voicemails left after 4:30 p.m. will not be returned until the following business day.  For prescription refill requests, have your pharmacy contact our office with your prescription refill request.

## 2013-09-01 NOTE — Progress Notes (Signed)
Javeria J Smigelski presented for Portacath access and flush. Proper placement of portacath confirmed by CXR. Portacath located right chest wall accessed with  H 20 needle. Good blood return present. Portacath flushed with 20ml NS and 500U/5ml Heparin and needle removed intact. Procedure without incident. Patient tolerated procedure well.   

## 2013-09-01 NOTE — Progress Notes (Signed)
[facility]  OFFICE PROGRESS NOTE  [name], NP No address on file  DIAGNOSIS: Adenocarcinoma of colon - Plan: CBC with Differential, Comprehensive metabolic panel, CEA  Chief Complaint  Patient presents with  . Colon Cancer    CURRENT THERAPY: Stivarga 120 mg daily for 3 weeks out of every four.  INTERVAL HISTORY: Yolanda Friedman 62 y.o. female returns for followup while taking Stivarga 3 weeks on and one week off for recurrent carcinoma of the colon, K-ras mutant.  She is currently in week 2 of a three-week cycle taking stivarga. She has occasional sores around corners of her lip which is relieved by Orajel. She denies any dysphagia, nausea, vomiting, rare episodes of diarrhea, some skin rash, no lower extremity swelling or redness, no chest pain, PND, orthopnea, palpitations, headache, or seizures. She also denies any significant back pain.  MEDICAL HISTORY: Past Medical History  Diagnosis Date  . Hypertension   . Cancer   . Colon cancer 2005    metastatic 2012  . 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 Diabetes mellitus   . HTN (hypertension) 12/22/2011  . Shortness of breath   . GERD (gastroesophageal reflux disease)   . Anxiety     INTERIM HISTORY: has Metastatic recurrent adenocarcinoma of colon; HTN (hypertension); Nausea; CVA (cerebral infarction); and Anemia on her problem list.   62 y.o. 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 She was seen by Dr. Lattie Corns at [facility] who recommended Stivarga versus clinical trial. Patient has opted for treatment with Stivarga prior to enrolling in a study. Stivarga (Regorafenib) 160 mg PO daily x 21 days with 7 day respite. Started ~01/27/2013, CEA on 01/20/2013 was 12.1. In view of side effects, Stivarga dose was reduced to 120 mg days 1-21 with  7 days in August, 2014.   ALLERGIES:  has No Known Allergies.  MEDICATIONS: has a current medication list which includes the following prescription(s): albuterol, magic mouthwash, amlodipine-valsartan, aspirin, clopidogrel, diphenoxylate-atropine, hydrocodone-acetaminophen, lorazepam, metformin, multiple vitamins-minerals, omeprazole, ondansetron, potassium chloride, stivarga, temazepam, and aspirin-caffeine.  SURGICAL HISTORY:  Past Surgical History  Procedure Laterality Date  . Cholecystectomy  1987  . Colectomy  2005    colon ca  . Abdominal hysterectomy  12/20/2010    met colon ca  . Portacath placement  01/2011  . Cataract extraction w/phaco  08/08/2012    Procedure: CATARACT EXTRACTION PHACO AND INTRAOCULAR LENS PLACEMENT (IOC);  Surgeon: Gemma Payor, MD;  Location: [facility];  Service: Ophthalmology;  Laterality: Right;  CDE:16.18  . Cataract extraction w/phaco  08/26/2012    Procedure: CATARACT EXTRACTION PHACO AND INTRAOCULAR LENS PLACEMENT (IOC);  Surgeon: Gemma Payor, MD;  Location: [facility];  Service: Ophthalmology;  Laterality: Left;  CDE 7.64    FAMILY HISTORY: family history includes Cancer in her mother; Diabetes in her sister; Hypertension in her maternal uncle and sister.  SOCIAL HISTORY:  reports that she has never smoked. She has never used smokeless tobacco. She reports that she drinks alcohol. She reports that she uses illicit drugs (Marijuana and Solvent inhalants).  REVIEW OF SYSTEMS:  Other than that discussed above is noncontributory.  PHYSICAL EXAMINATION: ECOG PERFORMANCE STATUS: 1 - Symptomatic but completely ambulatory  There were no vitals taken for this visit.  GENERAL:alert, no distress and comfortable SKIN: skin color, texture, turgor are normal, no rashes  or significant lesions EYES: PERLA; Conjunctiva are pink and non-injected, sclera clear OROPHARYNX:no exudate, no erythema on lips, buccal mucosa, or tongue. NECK: supple, thyroid normal size, non-tender,  without nodularity. No masses CHEST: Normal AP diameter with no breast masses. LYMPH:  no palpable lymphadenopathy in the cervical, axillary or inguinal LUNGS: clear to auscultation and percussion with normal breathing effort HEART: regular rate & rhythm and no murmurs. ABDOMEN:abdomen soft, non-tender and normal bowel sounds. No free fluid wave or shifting dullness. MUSCULOSKELETAL:no cyanosis of digits and no clubbing. Range of motion normal.  NEURO: alert & oriented x 3 with fluent speech, no focal motor/sensory deficits   LABORATORY DATA: No visits with results within 30 Day(s) from this visit. Latest known visit with results is:  Office Visit on 07/14/2013  Component Date Value Range Status  . WBC 07/14/2013 5.4  4.0 - 10.5 K/uL Final  . RBC 07/14/2013 3.80* 3.87 - 5.11 MIL/uL Final  . Hemoglobin 07/14/2013 11.3* 12.0 - 15.0 g/dL Final  . HCT 40/98/1191 35.3* 36.0 - 46.0 % Final  . MCV 07/14/2013 92.9  78.0 - 100.0 fL Final  . MCH 07/14/2013 29.7  26.0 - 34.0 pg Final  . MCHC 07/14/2013 32.0  30.0 - 36.0 g/dL Final  . RDW 47/82/9562 16.1* 11.5 - 15.5 % Final  . Platelets 07/14/2013 271  150 - 400 K/uL Final  . Neutrophils Relative % 07/14/2013 53  43 - 77 % Final  . Neutro Abs 07/14/2013 2.8  1.7 - 7.7 K/uL Final  . Lymphocytes Relative 07/14/2013 37  12 - 46 % Final  . Lymphs Abs 07/14/2013 2.0  0.7 - 4.0 K/uL Final  . Monocytes Relative 07/14/2013 8  3 - 12 % Final  . Monocytes Absolute 07/14/2013 0.4  0.1 - 1.0 K/uL Final  . Eosinophils Relative 07/14/2013 2  0 - 5 % Final  . Eosinophils Absolute 07/14/2013 0.1  0.0 - 0.7 K/uL Final  . Basophils Relative 07/14/2013 0  0 - 1 % Final  . Basophils Absolute 07/14/2013 0.0  0.0 - 0.1 K/uL Final  . Sodium 07/14/2013 137  135 - 145 mEq/L Final  . Potassium 07/14/2013 3.9  3.5 - 5.1 mEq/L Final  . Chloride 07/14/2013 103  96 - 112 mEq/L Final  . CO2 07/14/2013 23  19 - 32 mEq/L Final  . Glucose, Bld 07/14/2013 95  70 - 99 mg/dL  Final  . BUN 13/04/6577 7  6 - 23 mg/dL Final  . Creatinine, Ser 07/14/2013 0.81  0.50 - 1.10 mg/dL Final  . Calcium 46/96/2952 9.4  8.4 - 10.5 mg/dL Final  . Total Protein 07/14/2013 6.9  6.0 - 8.3 g/dL Final  . Albumin 84/13/2440 3.3* 3.5 - 5.2 g/dL Final  . AST 07/01/2535 38* 0 - 37 U/L Final  . ALT 07/14/2013 24  0 - 35 U/L Final  . Alkaline Phosphatase 07/14/2013 64  39 - 117 U/L Final  . Total Bilirubin 07/14/2013 0.3  0.3 - 1.2 mg/dL Final  . GFR calc non Af Amer 07/14/2013 76* >90 mL/min Final  . GFR calc Af Amer 07/14/2013 88* >90 mL/min Final   Comment: (NOTE)                          The eGFR has been calculated using the CKD EPI equation.  This calculation has not been validated in all clinical situations.                          eGFR's persistently <90 mL/min signify possible Chronic Kidney                          Disease.  . CEA 07/14/2013 10.7* 0.0 - 5.0 ng/mL Final   Performed at [facility]    PATHOLOGY: No new pathology. The colon tumor is K-ras mutated.  Urinalysis    Component Value Date/Time   COLORURINE YELLOW 11/17/2010 1332   APPEARANCEUR CLEAR 11/17/2010 1332   LABSPEC 1.020 01/01/2013 0908   PHURINE 5.5 01/01/2013 0908   GLUCOSEU NEGATIVE 01/01/2013 0908   HGBUR NEGATIVE 01/01/2013 0908   BILIRUBINUR NEGATIVE 01/01/2013 0908   KETONESUR NEGATIVE 01/01/2013 0908   PROTEINUR NEGATIVE 01/01/2013 0908   UROBILINOGEN 0.2 01/01/2013 0908   NITRITE NEGATIVE 01/01/2013 0908   LEUKOCYTESUR NEGATIVE 01/01/2013 0908    RADIOGRAPHIC STUDIES: Last CT scans were done in July and will be repeated in January 2015.  ASSESSMENT:  #1.Metastatic adenocarcinoma of colon positive KRAS mutation with disease progression despite FOLFOX + Avastin followed by FOLFIRI + Avastin: Since patient is able to tolerate the current dose of Stivarga, continue 120 mg 21 days on and 7 days off schedule. Since patient is going out of the town will arrange for the  repeat CT scans next visit. Her CEA level which was performed on October 13 was 10.1 which is decreased from September which was 13.6. Disease confined to retroperitoneal lymph nodes as well as the peritoneum. #2. Minimal oral mucositis secondary to Stivarga.   PLAN:  #1. Repeat CT scans of the chest abdomen and pelvis during week of 09/18/2011 with mammogram done the same day hopefully. #2. Continue using Orajel as currently being done. #3. Followup in 4 weeks with CBC, chem profile, and CEA. #4. Continue stivarga 120 mg daily for 3 weeks on one week off, currently beginning week 2 of a cycle.   All questions were answered. The patient knows to call the clinic with any problems, questions or concerns. We can certainly see the patient much sooner if necessary.   I spent 25 minutes counseling the patient face to face. The total time spent in the appointment was 30 minutes.    Maurilio Lovely, MD 09/01/2013 1:02 PM

## 2013-09-02 LAB — CEA: CEA: 11.3 ng/mL — ABNORMAL HIGH (ref 0.0–5.0)

## 2013-09-08 ENCOUNTER — Other Ambulatory Visit (HOSPITAL_COMMUNITY): Payer: Self-pay | Admitting: Oncology

## 2013-09-10 ENCOUNTER — Other Ambulatory Visit (HOSPITAL_COMMUNITY): Payer: Self-pay | Admitting: Oncology

## 2013-09-10 DIAGNOSIS — C189 Malignant neoplasm of colon, unspecified: Secondary | ICD-10-CM

## 2013-09-10 MED ORDER — HYDROCODONE-ACETAMINOPHEN 10-325 MG PO TABS: 1.0000 | ORAL_TABLET | Freq: Four times a day (QID) | ORAL | Status: DC | PRN

## 2013-09-18 ENCOUNTER — Ambulatory Visit (HOSPITAL_COMMUNITY)
Admission: RE | Admit: 2013-09-18 | Discharge: 2013-09-18 | Disposition: A | Discharge status: Home / Self Care | Payer: PRIVATE HEALTH INSURANCE | Source: Ambulatory Visit | Attending: Hematology and Oncology | Admitting: Hematology and Oncology

## 2013-09-18 DIAGNOSIS — C189 Malignant neoplasm of colon, unspecified: Secondary | ICD-10-CM

## 2013-09-18 DIAGNOSIS — R918 Other nonspecific abnormal finding of lung field: Secondary | ICD-10-CM | POA: Insufficient documentation

## 2013-09-18 DIAGNOSIS — I7090 Unspecified atherosclerosis: Secondary | ICD-10-CM | POA: Insufficient documentation

## 2013-09-18 DIAGNOSIS — Z1231 Encounter for screening mammogram for malignant neoplasm of breast: Secondary | ICD-10-CM | POA: Insufficient documentation

## 2013-09-18 DIAGNOSIS — C786 Secondary malignant neoplasm of retroperitoneum and peritoneum: Secondary | ICD-10-CM | POA: Insufficient documentation

## 2013-09-18 MED ORDER — IOHEXOL 300 MG/ML  SOLN
100.0000 mL | Freq: Once | INTRAMUSCULAR | Status: AC | PRN
  Administered 2013-09-18: 100 mL via INTRAVENOUS

## 2013-09-23 ENCOUNTER — Other Ambulatory Visit (HOSPITAL_COMMUNITY): Payer: Self-pay | Admitting: *Deleted

## 2013-09-23 ENCOUNTER — Telehealth (HOSPITAL_COMMUNITY): Payer: Self-pay | Admitting: *Deleted

## 2013-09-23 DIAGNOSIS — C189 Malignant neoplasm of colon, unspecified: Secondary | ICD-10-CM

## 2013-09-23 NOTE — Telephone Encounter (Signed)
Patient takes Stivarga (2/3/2/3 tablets) for 21 days on and 7 days off.   She is going to stay off of this medication for 1 month because she is starting to have a lot of side effects (persistent nausea, sometimes diarrhea/sometimes constipation, gas really bad, stomach feels bloated all the time with the smallest amount of food in it, and dark spots are coming up all over her body that hurt, feet still peeling).   She states that she will not stay off of the med forever and she wants to keep getting checked out by [place]. However, she states that her body needs a rest.

## 2013-09-28 NOTE — Progress Notes (Signed)
Yolanda Paschal, NP No address on file  Metastatic recurrent adenocarcinoma of colon  CURRENT THERAPY: Stivarga 120 mg daily for 3 weeks out of every four.  Last taken on Tuesday 09/23/2013.  Will reduce the dose in the future, see plan below.  INTERVAL HISTORY: Yolanda Friedman 63 y.o. female returns for  regular  visit for followup of metastatic adenocarcinoma of colon (KRAS MUTATION DETECTED) with progression despite FOLFOX + Avastin followed by FOLFIRI + Avastin. She was seen by Dr. Leamon Arnt at [facility] who recommended Stivarga versus clinical trial. Patient has opted for treatment with Stivarga prior to enrolling in a study. Stivarga (Regorafenib) 160 mg PO daily x 21 days with 7 day respite. Started ~01/27/2013, CEA on 01/20/2013 was 12.1. In view of side effects, Stivarga dose was reduced to 120 mg days 1-21 with 7 days in August, 2014.     Metastatic recurrent adenocarcinoma of colon   12/06/2010 Initial Diagnosis Metastatic adenocarcinoma of colon by Dr. 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  This was recognized to be invading the pancrease.  The patient was closed.   09/20/2011 - 02/21/2012 Chemotherapy 12 treatments of FOLFIRI + Avastin    09/09/2012 Relapse/Recurrence CT scan showed progression   09/25/2012 - 12/11/2012 Chemotherapy FOLFIRI + Avastin   10/02/2012 Progression CT scans demonstrate progression of disease.   01/27/2013 -  Chemotherapy Stivarga, dose reduction in August 2014 to 120 mg days 1-21.    I personally reviewed and went over laboratory results with the patient.  Results follow below.  I personally reviewed and went over radiographic studies with the patient.  Restaging CT CAP performed in 09/18/2013 demonstrating a  mixed response with stability of disease in lungs, but mild progression of multi-focal peritoneal metastatic disease.  There is no present evidence for hepatic metastatic disease.  With this information I have recommended continuation of therapy.  She notes that she took her last dose of Stivarga on Tuesday, 09/23/2013. She reports that she was having side effects of therapy including handed-foot syndrome, fatigue, alopecia, and nausea, just to name a few. She reports that she feels much better now she is off the medication. She is not interested in restarting the medication as previously prescribed.  I provided him a with information regarding dose reductions of Stivarga.   I recommend that Yolanda Friedman restart Stivarga at the same dose but taking the medication 14 days on with a 14 day break. This acts as a dose reduction. Hopefully this is more tolerated yet effective.  She's made clear to me today that she would like a four-week break from all therapy and then she would like to rediscuss this opportunity for treatment in the near future.  Given Joan is metastatic disease, this is certainly appropriate and we will give her a one-month break and all therapy and see her back in one month to discuss future or therapeutic intervention.   Today, Yolanda Friedman is denies any complaints and ROS questioning is negative.  Past Medical History  Diagnosis Date  . Hypertension   . Cancer   . Colon cancer 2005    metastatic 2012  . 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 Diabetes mellitus   . HTN (hypertension) 12/22/2011  . Shortness of breath   .  GERD (gastroesophageal reflux disease)   . Anxiety     has Metastatic recurrent adenocarcinoma of colon; HTN (hypertension); Nausea; CVA (cerebral infarction); and Anemia on her problem list.     has No Known Allergies.  Yolanda Friedman does not currently have medications on file.  Past Surgical History  Procedure Laterality Date  .  Cholecystectomy  1987  . Colectomy  2005    colon ca  . Abdominal hysterectomy  12/20/2010    met colon ca  . Portacath placement  01/2011  . Cataract extraction w/phaco  08/08/2012    Procedure: CATARACT EXTRACTION PHACO AND INTRAOCULAR LENS PLACEMENT (IOC);  Surgeon: Tonny Branch, MD;  Location: [facility];  Service: Ophthalmology;  Laterality: Right;  CDE:16.18  . Cataract extraction w/phaco  08/26/2012    Procedure: CATARACT EXTRACTION PHACO AND INTRAOCULAR LENS PLACEMENT (IOC);  Surgeon: Tonny Branch, MD;  Location: [facility];  Service: Ophthalmology;  Laterality: Left;  CDE 7.64    Denies any headaches, dizziness, double vision, fevers, chills, night sweats, vomiting, diarrhea, constipation, chest pain, heart palpitations, shortness of breath, blood in stool, black tarry stool, urinary pain, urinary burning, urinary frequency, hematuria.   PHYSICAL EXAMINATION  ECOG PERFORMANCE STATUS: 0 - Asymptomatic  Filed Vitals:   09/29/13 1318  BP: 147/87  Pulse: 97  Temp: 98.1 F (36.7 C)  Resp: 18    GENERAL:alert, no distress, well nourished, well developed, comfortable, cooperative and smiling SKIN: skin color, texture, turgor are normal, no rashes or significant lesions HEAD: Normocephalic, No masses, lesions, tenderness or abnormalities EYES: normal, PERRLA, EOMI, Conjunctiva are pink and non-injected EARS: External ears normal OROPHARYNX:mucous membranes are moist  NECK: supple, trachea midline LYMPH:  not examined BREAST:not examined LUNGS: not examined HEART: not examined ABDOMEN:not examined BACK: not examined EXTREMITIES:less then 2 second capillary refill, no skin discoloration, no cyanosis  NEURO: alert & oriented x 3 with fluent speech, no focal motor/sensory deficits, gait normal    LABORATORY DATA: CBC    Component Value Date/Time   WBC 5.6 09/01/2013 1324   RBC 3.85* 09/01/2013 1324   HGB 11.2* 09/01/2013 1324   HCT 35.8* 09/01/2013 1324   PLT 223 09/01/2013 1324    MCV 93.0 09/01/2013 1324   MCH 29.1 09/01/2013 1324   MCHC 31.3 09/01/2013 1324   RDW 15.8* 09/01/2013 1324   LYMPHSABS 1.9 09/01/2013 1324   MONOABS 0.4 09/01/2013 1324   EOSABS 0.1 09/01/2013 1324   BASOSABS 0.0 09/01/2013 1324      Chemistry      Component Value Date/Time   NA 141 09/01/2013 1324   K 3.6 09/01/2013 1324   CL 105 09/01/2013 1324   CO2 26 09/01/2013 1324   BUN 10 09/01/2013 1324   CREATININE 0.62 09/01/2013 1324      Component Value Date/Time   CALCIUM 8.9 09/01/2013 1324   ALKPHOS 62 09/01/2013 1324   AST 28 09/01/2013 1324   ALT 16 09/01/2013 1324   BILITOT 0.2* 09/01/2013 1324     Lab Results  Component Value Date   CEA 11.3* 09/01/2013     RADIOGRAPHIC STUDIES:  09/18/2013  CLINICAL DATA: Metastatic colon cancer  EXAM:  CT CHEST, ABDOMEN, AND PELVIS WITH CONTRAST  TECHNIQUE:  Multidetector CT imaging of the chest, abdomen and pelvis was  performed following the standard protocol during bolus  administration of intravenous contrast.  CONTRAST: 158m OMNIPAQUE IOHEXOL 300 MG/ML SOLN  COMPARISON: CT chest 12/31/2012. CT abdomen and pelvis 03/08/2013.  FINDINGS:  CT CHEST FINDINGS  No pleural effusion identified. No airspace consolidation noted.  Stable 3 mm nodule in the superior segment of right lower lobe,  image 23/series 3. 3 mm left upper lobe nodule is also unchanged,  image 10/series 3. No new or enlarging pulmonary nodules or mass is  identified.  The trachea appears patent and is midline. The heart size appears  normal. No pericardial effusion. Calcified atherosclerotic disease  involves the LAD Coronary artery. No mediastinal or hilar  adenopathy.  No axillary or supraclavicular adenopathy noted. Review of the  visualized osseous structures is negative for lytic or sclerotic  bone lesion. There is mild degenerative disc disease noted.  CT ABDOMEN AND PELVIS FINDINGS  No focal liver abnormality is identified. Previous  cholecystectomy.  Pneumobilia noted compatible with biliary patency. No suspicious  pancreas abnormality. The spleen appears normal.  The adrenal glands are both normal. Normal appearance of both  kidneys. The urinary bladder is on unremarkable. Previous  hysterectomy.  Calcified atherosclerotic disease affects the abdominal aorta. No  aneurysm. There is no adenopathy identified within the upper abdomen  or pelvis.  Again noted are multi focal areas of peritoneal metastasis. Index  lesion within the lesser sac measures 4.5 x 2.7 cm, image 60/series  2. Previously this measured 4.1 x 2.2 cm. Deposit within the right  iliac fossa measures 2.7 x 2.2 cm, image 87/series 2. Previously  this measured 2.5 x 1.9 cm. Retroperitoneal implant ventral to the  right iliac vessels measures 1.7 x 1.9 cm, image 83/series 2.  Previously this measured 1.5 x 2.7 cm. Implant within the cul-de-sac  measures 3.1 cm, image 98/series 2. Previously 2.5 cm. Several  implants are identified along the undersurface of the ventral  abdominal wall. Index lesion measures 1.7 x 2.4 cm, image 70/series  2. Previously 1.4 x 2.2 cm.  The stomach appears normal. The small bowel loops are within normal  limits in caliber. Postoperative changes from right hemicolectomy  identified. No evidence for bowel obstruction noted.  Review of the visualized osseous structures shows no suspicious  lytic or sclerotic bone lesions.  IMPRESSION:  CT chest:  1. Several small pulmonary nodules are unchanged from previous exam.  2. Atherosclerotic calcifications involve the LAD coronary artery.  CT abdomen and pelvis:  1. Mild progression of multi focal peritoneal metastatic disease.  2. No evidence for liver metastases.  3. Previous cholecystectomy with pneumobilia.  Electronically Signed  By: Kerby Moors M.D.  On: 09/18/2013 13:17     ASSESSMENT:  1. Metastatic adenocarcinoma of colon (KRAS MUTATION DETECTED) with progression  despite FOLFOX + Avastin followed by FOLFIRI + Avastin. She was seen by Dr. Leamon Arnt at [facility] who recommended Stivarga versus clinical trial. Patient has opted for treatment with Stivarga prior to enrolling in a study. Stivarga (Regorafenib) 160 mg PO daily x 21 days with 7 day respite. Started ~01/27/2013, CEA on 01/20/2013 was 12.1. In view of side effects, Stivarga dose was reduced to 120 mg days 1-21 with 7 days in August, 2014.   Patient Active Problem List   Diagnosis Date Noted  . CVA (cerebral infarction) 06/26/2013  . Anemia 06/26/2013  . Nausea 12/27/2011  . HTN (hypertension) 12/22/2011  . Metastatic recurrent adenocarcinoma of colon 03/13/2011    PLAN:  1. I personally reviewed and went over laboratory results with the patient. 2. I personally reviewed and went over radiographic studies with the patient. 3. Labs in 4 weeks: CBC diff, CMET, CEA 4. Break in chemotherapy x  4 weeks.   5. Recommend restarting Stivarga at 120 mg 14 days on and 14 days off. 6. Return in 4 weeks to discuss #5.   THERAPY PLAN:  Unfortunately, Florena's disease has progressed within the abdomen, but only mildly.  She has experienced accumulative effects of Stivarga with present dose of chemotherapy (120 mg 21 days on and 7 days off).  She desires a 4 week break in therapy.  Following this break, we will discuss a dose reduction of Stivarga to 120 mg 14 days on and 14 days off.  All questions were answered. The patient knows to call the clinic with any problems, questions or concerns. We can certainly see the patient much sooner if necessary.  Patient and plan discussed with Dr. Farrel Gobble and he is in agreement with the aforementioned.   Yolanda Friedman

## 2013-09-29 ENCOUNTER — Encounter (HOSPITAL_COMMUNITY): Payer: PRIVATE HEALTH INSURANCE | Attending: Oncology

## 2013-09-29 ENCOUNTER — Ambulatory Visit (HOSPITAL_COMMUNITY): Payer: PRIVATE HEALTH INSURANCE

## 2013-09-29 ENCOUNTER — Encounter (HOSPITAL_BASED_OUTPATIENT_CLINIC_OR_DEPARTMENT_OTHER): Payer: PRIVATE HEALTH INSURANCE | Admitting: Oncology

## 2013-09-29 VITALS — BP 147/87 | HR 97 | Temp 98.1°F | Resp 18 | Wt 170.0 lb

## 2013-09-29 DIAGNOSIS — C786 Secondary malignant neoplasm of retroperitoneum and peritoneum: Secondary | ICD-10-CM

## 2013-09-29 DIAGNOSIS — C189 Malignant neoplasm of colon, unspecified: Secondary | ICD-10-CM

## 2013-09-29 LAB — COMPREHENSIVE METABOLIC PANEL
ALBUMIN: 3.4 g/dL — AB (ref 3.5–5.2)
ALT: 13 U/L (ref 0–35)
AST: 22 U/L (ref 0–37)
Alkaline Phosphatase: 60 U/L (ref 39–117)
BILIRUBIN TOTAL: 0.2 mg/dL — AB (ref 0.3–1.2)
BUN: 13 mg/dL (ref 6–23)
CHLORIDE: 104 meq/L (ref 96–112)
CO2: 26 mEq/L (ref 19–32)
CREATININE: 0.65 mg/dL (ref 0.50–1.10)
Calcium: 9.4 mg/dL (ref 8.4–10.5)
GFR calc Af Amer: >90 mL/min (ref >90)
GFR calc non Af Amer: >90 mL/min (ref >90)
GLUCOSE: 173 mg/dL — AB (ref 70–99)
Potassium: 3.6 mEq/L — ABNORMAL LOW (ref 3.7–5.3)
Sodium: 142 mEq/L (ref 137–147)
Total Protein: 7 g/dL (ref 6.0–8.3)

## 2013-09-29 LAB — CBC WITH DIFFERENTIAL/PLATELET
Basophils Absolute: 0 10*3/uL (ref 0.0–0.1)
Basophils Relative: 0 % (ref 0–1)
EOS ABS: 0.1 10*3/uL (ref 0.0–0.7)
Eosinophils Relative: 2 % (ref 0–5)
HEMATOCRIT: 35.8 % — AB (ref 36.0–46.0)
HEMOGLOBIN: 11.4 g/dL — AB (ref 12.0–15.0)
LYMPHS ABS: 2.1 10*3/uL (ref 0.7–4.0)
Lymphocytes Relative: 31 % (ref 12–46)
MCH: 29.2 pg (ref 26.0–34.0)
MCHC: 31.8 g/dL (ref 30.0–36.0)
MCV: 91.6 fL (ref 78.0–100.0)
MONO ABS: 0.6 10*3/uL (ref 0.1–1.0)
MONOS PCT: 9 % (ref 3–12)
Neutro Abs: 3.7 10*3/uL (ref 1.7–7.7)
Neutrophils Relative %: 57 % (ref 43–77)
Platelets: 204 10*3/uL (ref 150–400)
RBC: 3.91 MIL/uL (ref 3.87–5.11)
RDW: 16 % — ABNORMAL HIGH (ref 11.5–15.5)
WBC: 6.5 10*3/uL (ref 4.0–10.5)

## 2013-09-29 MED ORDER — HEPARIN SOD (PORK) LOCK FLUSH 100 UNIT/ML IV SOLN
500.0000 [IU] | Freq: Once | INTRAVENOUS | Status: AC
  Administered 2013-09-29: 500 [IU] via INTRAVENOUS
  Filled 2013-09-29: qty 5

## 2013-09-29 MED ORDER — SODIUM CHLORIDE 0.9 % IJ SOLN
10.0000 mL | INTRAMUSCULAR | Status: DC | PRN
  Administered 2013-09-29: 10 mL via INTRAVENOUS

## 2013-09-29 NOTE — Progress Notes (Signed)
Yolanda Friedman presented for Portacath access and flush. Portacath located right chest wall accessed with  H 20 needle. Good blood return present. Portacath flushed with 80ml NS and 500U/85ml Heparin and needle removed intact. Procedure without incident. Patient tolerated procedure well.

## 2013-09-29 NOTE — Patient Instructions (Addendum)
.  [place] Discharge Instructions  RECOMMENDATIONS MADE BY THE CONSULTANT AND ANY TEST RESULTS WILL BE SENT TO YOUR REFERRING PHYSICIAN.  EXAM FINDINGS BY THE PHYSICIAN TODAY AND SIGNS OR SYMPTOMS TO REPORT TO CLINIC OR PRIMARY PHYSICIAN: Exam and findings as discussed by T. Sheldon Silvan PA.  MEDICATIONS PRESCRIBED:  Hold stivarga as you wish for 4 weeks  INSTRUCTIONS/FOLLOW-UP: 4 weeks MD visit and port.  Thank you for choosing [facility] to provide your oncology and hematology care.  To afford each patient quality time with our providers, please arrive at least 15 minutes before your scheduled appointment time.  With your help, our goal is to use those 15 minutes to complete the necessary work-up to ensure our physicians have the information they need to help with your evaluation and healthcare recommendations.    Effective January 1st, 2014, we ask that you re-schedule your appointment with our physicians should you arrive 10 or more minutes late for your appointment.  We strive to give you quality time with our providers, and arriving late affects you and other patients whose appointments are after yours.    Again, thank you for choosing [facility].  Our hope is that these requests will decrease the amount of time that you wait before being seen by our physicians.       _____________________________________________________________  Should you have questions after your visit to [facility], please contact our office at [phone] between the hours of 8:30 a.m. and 5:00 p.m.  Voicemails left after 4:30 p.m. will not be returned until the following business day.  For prescription refill requests, have your pharmacy contact our office with your prescription refill request.

## 2013-09-29 NOTE — Addendum Note (Signed)
Addended by: Baird Cancer on: 09/29/2013 02:33 PM   Modules accepted: Level of Service

## 2013-09-30 ENCOUNTER — Other Ambulatory Visit (HOSPITAL_COMMUNITY): Payer: Self-pay | Admitting: Oncology

## 2013-09-30 LAB — CEA: CEA: 19.7 ng/mL — AB (ref 0.0–5.0)

## 2013-10-07 ENCOUNTER — Other Ambulatory Visit (HOSPITAL_COMMUNITY): Payer: Self-pay | Admitting: Oncology

## 2013-10-10 ENCOUNTER — Telehealth (HOSPITAL_COMMUNITY): Payer: Self-pay | Admitting: *Deleted

## 2013-10-10 ENCOUNTER — Other Ambulatory Visit (HOSPITAL_COMMUNITY): Payer: Self-pay | Admitting: Oncology

## 2013-10-10 DIAGNOSIS — C189 Malignant neoplasm of colon, unspecified: Secondary | ICD-10-CM

## 2013-10-10 MED ORDER — CLOPIDOGREL BISULFATE 75 MG PO TABS: 75.0000 mg | ORAL_TABLET | Freq: Every day | ORAL | Status: DC

## 2013-10-10 NOTE — Telephone Encounter (Signed)
Patient notified that we refilled her Plavix @ [facility] and that she needed to continue her ASA 325mg  daily. I notified patient that [name] wanted her to get a PCP. She got very upset (crying) and said that we were trying to get rid of her. I tried to tell her that we were not trying to get rid of her but that Tom didn't specialize in strokes and that she needed a PCP for that. She said she wasn't going to bother [place] anymore. I told her that he didn't mind refilling her Rx but that she needed a PCP. She said that catching a ride to her appointments was very difficult and that she couldn't ride the bus because she didn't have a medicare card.

## 2013-10-22 ENCOUNTER — Other Ambulatory Visit (HOSPITAL_COMMUNITY): Payer: Self-pay | Admitting: Oncology

## 2013-10-27 ENCOUNTER — Ambulatory Visit (HOSPITAL_COMMUNITY): Payer: PRIVATE HEALTH INSURANCE | Admitting: Oncology

## 2013-10-27 ENCOUNTER — Other Ambulatory Visit (HOSPITAL_COMMUNITY): Payer: PRIVATE HEALTH INSURANCE

## 2013-11-05 NOTE — Progress Notes (Signed)
Yolanda Paschal, NP No address on file  Metastatic recurrent adenocarcinoma of colon - Plan: CBC with Differential, Lipid panel, TSH, T4, free, Hemoglobin A1C, Ferritin, Iron and TIBC, Comprehensive metabolic panel, Vitamin D 25 hydroxy, CBC with Differential, Comprehensive metabolic panel, Lipid panel, TSH, T4, free, Hemoglobin A1C, Ferritin, Iron and TIBC, Vitamin D 25 hydroxy, heparin lock flush 100 unit/mL, sodium chloride 0.9 % injection 10 mL  Preventative health care - Plan: CBC with Differential, Lipid panel, TSH, T4, free, Hemoglobin A1C, Ferritin, Iron and TIBC, Comprehensive metabolic panel, Vitamin D 25 hydroxy, Lipid panel, TSH, T4, free, Hemoglobin A1C, Ferritin, Iron and TIBC, Vitamin D 25 hydroxy, heparin lock flush 100 unit/mL, sodium chloride 0.9 % injection 10 mL  CURRENT THERAPY: Stivarga 120 mg daily for 3 weeks out of every four. Last taken on Tuesday 09/23/2013. Patient demanded a break in therapy and today she desires no further therapy.  See plan below.  INTERVAL HISTORY: Yolanda Friedman 63 y.o. female returns for  regular  visit for followup of metastatic adenocarcinoma of colon (KRAS MUTATION DETECTED) with progression despite FOLFOX + Avastin followed by FOLFIRI + Avastin. She was seen by Dr. Leamon Arnt at [facility] who recommended Stivarga versus clinical trial. Patient has opted for treatment with Stivarga prior to enrolling in a study. Stivarga (Regorafenib) 160 mg PO daily x 21 days with 7 day respite. Started ~01/27/2013, CEA on 01/20/2013 was 12.1. In view of side effects, Stivarga dose was reduced to 120 mg days 1-21 with 7 days in August 2014. Patient demanded a 4 week break in therapy and this was granted at the end of Jan 2015.    Metastatic recurrent adenocarcinoma of colon   12/06/2010 Initial Diagnosis Metastatic adenocarcinoma of colon by Dr. 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  This was recognized to be invading the pancrease.  The patient was closed.   09/20/2011 - 02/21/2012 Chemotherapy 12 treatments of FOLFIRI + Avastin    09/09/2012 Relapse/Recurrence CT scan showed progression   09/25/2012 - 12/11/2012 Chemotherapy FOLFIRI + Avastin   10/02/2012 Progression CT scans demonstrate progression of disease.   01/27/2013 - 09/23/2013 Chemotherapy Stivarga, dose reduction in August 2014 to 120 mg days 1-21.  Break in therapy provided Jan-Feb 2015.   11/06/2013 Treatment Plan Change Transition to Comfort care at the patient's request.     I personally reviewed and went over laboratory results with the patient.  The results are noted within this dictation.  Yolanda Friedman was provided a 4 week break from therapy at her request at the end of Jan through Feb 2015.  This break was provided.  Her last dose of Stivarga was on 09/23/13.  Yolanda Friedman is clear today that she wants to stop all chemotherapy. She was offered a dose reduction in Stivarga 120 mg 14 days on and 14 days off but she is absolutely positive she does not want any further chemotherapy. I remember going back months ago, she was raised about chemotherapy but was undergoing therapy for her family. This is certainly a reasonable decision as she has metastatic disease and is incurable. She had difficulties with Stivarga with toxicities associated with that medication. She admitted to mouth soreness and peripheral neuropathy and other discomforts that affected her quality of life.  At this time, she like to focus on quality of life as the quantity of life which is absolutely reasonable.  I did broach the topic of hospice and she replied to me "I will never need hospice because when it comes to that point the Yolanda Friedman will take me." After that comment, I did not push the  subject much more.  We discussed CODE STATUS, and reviewed the possibilities. We discussed a DO NOT RESUSCITATE CODE STATUS and we'll that entails. We also discussed full code. We discussed the pros and cons of both. She understands that she has metastatic disease and if a full code is performed and she is resuscitation will be brought back to life with this same prognosis from an oncology standpoint. With this being said, she is interested in remaining full code at this point time. This will need to be an ongoing discussion in the future.  From a oncologic standpoint, the patient denies any complaints are as pushing is negative.  Past Medical History  Diagnosis Date  . Hypertension   . Cancer   . Colon cancer 2005    metastatic 2012  . 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 Diabetes mellitus   . HTN (hypertension) 12/22/2011  . Shortness of breath   . GERD (gastroesophageal reflux disease)   . Anxiety     has Metastatic recurrent adenocarcinoma of colon; HTN (hypertension); CVA (cerebral infarction); and Anemia on her problem list.     has No Known Allergies.  Yolanda Friedman had no medications administered during this visit.  Past Surgical History  Procedure Laterality Date  . Cholecystectomy  1987  . Colectomy  2005    colon ca  . Abdominal hysterectomy  12/20/2010    met colon ca  . Portacath placement  01/2011  . Cataract extraction w/phaco  08/08/2012    Procedure: CATARACT EXTRACTION PHACO AND INTRAOCULAR LENS PLACEMENT (IOC);  Surgeon: Tonny Branch, MD;  Location: [facility];  Service: Ophthalmology;  Laterality: Right;  CDE:16.18  . Cataract extraction w/phaco  08/26/2012    Procedure: CATARACT EXTRACTION PHACO AND INTRAOCULAR LENS PLACEMENT (IOC);  Surgeon: Tonny Branch, MD;  Location: [facility];  Service: Ophthalmology;  Laterality: Left;  CDE 7.64    Denies any headaches, dizziness, double vision, fevers, chills, night sweats, nausea, vomiting, diarrhea,  constipation, chest pain, heart palpitations, shortness of breath, blood in stool, black tarry stool, urinary pain, urinary burning, urinary frequency, hematuria.   PHYSICAL EXAMINATION  ECOG PERFORMANCE STATUS: 1 - Symptomatic but completely ambulatory  Filed Vitals:   11/06/13 1422  BP: 157/91  Pulse: 97  Temp: 97.9 F (36.6 C)  Resp: 20    GENERAL:alert, well nourished, well developed, comfortable, cooperative and smiling SKIN: skin color, texture, turgor are normal, no rashes or significant lesions HEAD: Normocephalic, No masses, lesions, tenderness or abnormalities EYES: normal, PERRLA, EOMI, Conjunctiva are pink and non-injected EARS: External ears normal OROPHARYNX:mucous membranes are moist  NECK: supple, trachea midline LYMPH:  not examined BREAST:not examined LUNGS: not examined HEART: not examined ABDOMEN:not examined BACK: Back symmetric, no curvature. EXTREMITIES:less then 2 second capillary refill, no clubbing, no cyanosis  NEURO: alert & oriented x 3 with fluent speech, no focal motor/sensory deficits, gait normal   LABORATORY DATA: CBC    Component Value Date/Time   WBC 6.5 09/29/2013 1257   RBC 3.91 09/29/2013 1257   HGB 11.4* 09/29/2013 1257   HCT 35.8* 09/29/2013 1257   PLT 204 09/29/2013 1257   MCV 91.6 09/29/2013  1257   MCH 29.2 09/29/2013 1257   MCHC 31.8 09/29/2013 1257   RDW 16.0* 09/29/2013 1257   LYMPHSABS 2.1 09/29/2013 1257   MONOABS 0.6 09/29/2013 1257   EOSABS 0.1 09/29/2013 1257   BASOSABS 0.0 09/29/2013 1257      Chemistry      Component Value Date/Time   NA 142 09/29/2013 1257   K 3.6* 09/29/2013 1257   CL 104 09/29/2013 1257   CO2 26 09/29/2013 1257   BUN 13 09/29/2013 1257   CREATININE 0.65 09/29/2013 1257      Component Value Date/Time   CALCIUM 9.4 09/29/2013 1257   ALKPHOS 60 09/29/2013 1257   AST 22 09/29/2013 1257   ALT 13 09/29/2013 1257   BILITOT 0.2* 09/29/2013 1257     Lab Results  Component Value Date   CEA 19.7* 09/29/2013     ASSESSMENT:  1. Metastatic adenocarcinoma of colon (KRAS MUTATION DETECTED) with progression despite FOLFOX + Avastin followed by FOLFIRI + Avastin. She was seen by Dr. Leamon Arnt at [facility] who recommended Stivarga versus clinical trial. Patient has opted for treatment with Stivarga prior to enrolling in a study. Stivarga (Regorafenib) 160 mg PO daily x 21 days with 7 day respite. Started ~01/27/2013, CEA on 01/20/2013 was 12.1. In view of side effects, Stivarga dose was reduced to 120 mg days 1-21 with 7 days in August 2014. Patient demanded a 4 week break in therapy and this was granted at the end of Jan 2015.  She has declined any further active therapy at this time. 2. FULL CODE  Patient Active Problem List   Diagnosis Date Noted  . CVA (cerebral infarction) 06/26/2013  . Anemia 06/26/2013  . HTN (hypertension) 12/22/2011  . Metastatic recurrent adenocarcinoma of colon 03/13/2011     PLAN:  1. I personally reviewed and went over laboratory results with the patient.  The results are noted within this dictation. 2. I personally reviewed and went over radiographic studies with the patient.  The results are noted within this dictation.   3. Labs today at the request of PCP: CBC diff, CMET, lipid profile, TSH, T4 free, Ferritin, Iron/TIBC, Hgb A1c, Vit D level. 4. No oncology role for blood work due to patient not wanting treatment. 5. No role for imaging studies due to patient not wanting treatment. 6. Code status discussed.  She wants to remain a FULL CODE. Will need to discuss this further moving forward. 7. Broached the topic of Hospice and that was not well received.  8. Transition to comfort care. 9. Return in 4 weeks for follow-up.   THERAPY PLAN:  Yolanda Friedman was given a 4 week break in therapy from the end of Jan through Feb 2015.  Her last Stivarga dose was on 09/23/2013.  She desires to stop all chemotherapy which is reasonable given her prognosis.  We will  transition to comfort care.  She wishes to remain a FULL CODE.  She has declined Hospice at this time.    All questions were answered. The patient knows to call the clinic with any problems, questions or concerns. We can certainly see the patient much sooner if necessary.  Patient and plan discussed with Dr. Farrel Gobble and he is in agreement with the aforementioned.   KEFALAS,THOMAS

## 2013-11-06 ENCOUNTER — Encounter (HOSPITAL_COMMUNITY): Payer: Self-pay | Admitting: Oncology

## 2013-11-06 ENCOUNTER — Encounter (HOSPITAL_COMMUNITY): Payer: PRIVATE HEALTH INSURANCE

## 2013-11-06 ENCOUNTER — Encounter (HOSPITAL_COMMUNITY): Payer: PRIVATE HEALTH INSURANCE | Attending: Oncology | Admitting: Oncology

## 2013-11-06 VITALS — BP 157/91 | HR 97 | Temp 97.9°F | Resp 20 | Wt 176.0 lb

## 2013-11-06 DIAGNOSIS — E119 Type 2 diabetes mellitus without complications: Secondary | ICD-10-CM

## 2013-11-06 DIAGNOSIS — I10 Essential (primary) hypertension: Secondary | ICD-10-CM

## 2013-11-06 DIAGNOSIS — Z0000 Encounter for general adult medical examination without abnormal findings: Secondary | ICD-10-CM | POA: Insufficient documentation

## 2013-11-06 DIAGNOSIS — C189 Malignant neoplasm of colon, unspecified: Secondary | ICD-10-CM

## 2013-11-06 DIAGNOSIS — C7960 Secondary malignant neoplasm of unspecified ovary: Secondary | ICD-10-CM

## 2013-11-06 LAB — COMPREHENSIVE METABOLIC PANEL
ALBUMIN: 3.5 g/dL (ref 3.5–5.2)
ALT: 13 U/L (ref 0–35)
AST: 26 U/L (ref 0–37)
Alkaline Phosphatase: 58 U/L (ref 39–117)
BUN: 9 mg/dL (ref 6–23)
CALCIUM: 9.5 mg/dL (ref 8.4–10.5)
CO2: 28 mEq/L (ref 19–32)
Chloride: 104 mEq/L (ref 96–112)
Creatinine, Ser: 0.71 mg/dL (ref 0.50–1.10)
GFR calc non Af Amer: 90 mL/min — ABNORMAL LOW (ref >90)
GLUCOSE: 85 mg/dL (ref 70–99)
Potassium: 3.5 mEq/L — ABNORMAL LOW (ref 3.7–5.3)
Sodium: 143 mEq/L (ref 137–147)
Total Bilirubin: 0.2 mg/dL — ABNORMAL LOW (ref 0.3–1.2)
Total Protein: 6.9 g/dL (ref 6.0–8.3)

## 2013-11-06 LAB — LIPID PANEL
CHOL/HDL RATIO: 3.2 ratio
CHOLESTEROL: 182 mg/dL (ref 0–200)
HDL: 57 mg/dL (ref >39)
LDL Cholesterol: 104 mg/dL — ABNORMAL HIGH (ref 0–99)
Triglycerides: 107 mg/dL (ref <150)
VLDL: 21 mg/dL (ref 0–40)

## 2013-11-06 LAB — CBC WITH DIFFERENTIAL/PLATELET
Basophils Absolute: 0 10*3/uL (ref 0.0–0.1)
Basophils Relative: 0 % (ref 0–1)
EOS ABS: 0.1 10*3/uL (ref 0.0–0.7)
EOS PCT: 2 % (ref 0–5)
HCT: 31.7 % — ABNORMAL LOW (ref 36.0–46.0)
Hemoglobin: 10.1 g/dL — ABNORMAL LOW (ref 12.0–15.0)
LYMPHS ABS: 2.2 10*3/uL (ref 0.7–4.0)
Lymphocytes Relative: 42 % (ref 12–46)
MCH: 30 pg (ref 26.0–34.0)
MCHC: 31.9 g/dL (ref 30.0–36.0)
MCV: 94.1 fL (ref 78.0–100.0)
Monocytes Absolute: 0.5 10*3/uL (ref 0.1–1.0)
Monocytes Relative: 9 % (ref 3–12)
NEUTROS PCT: 47 % (ref 43–77)
Neutro Abs: 2.5 10*3/uL (ref 1.7–7.7)
Platelets: 240 10*3/uL (ref 150–400)
RBC: 3.37 MIL/uL — ABNORMAL LOW (ref 3.87–5.11)
RDW: 15.5 % (ref 11.5–15.5)
WBC: 5.2 10*3/uL (ref 4.0–10.5)

## 2013-11-06 LAB — HEMOGLOBIN A1C
Hgb A1c MFr Bld: 6 % — ABNORMAL HIGH (ref <5.7)
Mean Plasma Glucose: 126 mg/dL — ABNORMAL HIGH (ref <117)

## 2013-11-06 LAB — IRON AND TIBC
IRON: 42 ug/dL (ref 42–135)
Saturation Ratios: 14 % — ABNORMAL LOW (ref 20–55)
TIBC: 299 ug/dL (ref 250–470)
UIBC: 257 ug/dL (ref 125–400)

## 2013-11-06 MED ORDER — HEPARIN SOD (PORK) LOCK FLUSH 100 UNIT/ML IV SOLN
500.0000 [IU] | Freq: Once | INTRAVENOUS | Status: AC
  Administered 2013-11-06: 500 [IU] via INTRAVENOUS
  Filled 2013-11-06: qty 5

## 2013-11-06 MED ORDER — SODIUM CHLORIDE 0.9 % IJ SOLN
10.0000 mL | INTRAMUSCULAR | Status: DC | PRN
  Administered 2013-11-06: 10 mL via INTRAVENOUS

## 2013-11-06 NOTE — Progress Notes (Signed)
Yolanda Friedman presented for Portacath access and flush. Proper placement of portacath confirmed by CXR. Portacath located right chest wall accessed with  H 20 needle. Good blood return present.  Blood withdrawn for ordered labs. Portacath flushed with 28ml NS and 500U/76ml Heparin and needle removed intact.- Procedure without incident. Patient tolerated procedure well.

## 2013-11-06 NOTE — Patient Instructions (Signed)
[facility] Discharge Instructions  RECOMMENDATIONS MADE BY THE CONSULTANT AND ANY TEST RESULTS WILL BE SENT TO YOUR REFERRING PHYSICIAN.  EXAM FINDINGS BY THE PHYSICIAN TODAY AND SIGNS OR SYMPTOMS TO REPORT TO CLINIC OR PRIMARY PHYSICIAN: Exam and findings as discussed by Robynn Pane, PA-C.  We will stop chemotherapy.  Report uncontrolled pain or other problems.  MEDICATIONS PRESCRIBED:  none  INSTRUCTIONS/FOLLOW-UP: Port flush and office visit in 4 weeks.  Thank you for choosing [facility] to provide your oncology and hematology care.  To afford each patient quality time with our providers, please arrive at least 15 minutes before your scheduled appointment time.  With your help, our goal is to use those 15 minutes to complete the necessary work-up to ensure our physicians have the information they need to help with your evaluation and healthcare recommendations.    Effective January 1st, 2014, we ask that you re-schedule your appointment with our physicians should you arrive 10 or more minutes late for your appointment.  We strive to give you quality time with our providers, and arriving late affects you and other patients whose appointments are after yours.    Again, thank you for choosing [facility].  Our hope is that these requests will decrease the amount of time that you wait before being seen by our physicians.       _____________________________________________________________  Should you have questions after your visit to [facility], please contact our office at [phone] between the hours of 8:30 a.m. and 5:00 p.m.  Voicemails left after 4:30 p.m. will not be returned until the following business day.  For prescription refill requests, have your pharmacy contact our office with your prescription refill request.

## 2013-11-07 ENCOUNTER — Telehealth (HOSPITAL_COMMUNITY): Payer: Self-pay

## 2013-11-07 ENCOUNTER — Encounter (HOSPITAL_COMMUNITY): Payer: Self-pay

## 2013-11-07 LAB — FERRITIN: Ferritin: 17 ng/mL (ref 10–291)

## 2013-11-07 LAB — T4, FREE: Free T4: 1.16 ng/dL (ref 0.80–1.80)

## 2013-11-07 LAB — TSH: TSH: 1.776 u[IU]/mL (ref 0.350–4.500)

## 2013-11-07 LAB — VITAMIN D 25 HYDROXY (VIT D DEFICIENCY, FRACTURES): VIT D 25 HYDROXY: 33 ng/mL (ref 30–89)

## 2013-11-07 NOTE — Telephone Encounter (Signed)
Call from care provider [facility].  Wants to know if we are going to manage Yolanda Friedman's anemia or do you want them to manage?

## 2013-12-04 ENCOUNTER — Encounter (HOSPITAL_COMMUNITY): Payer: PRIVATE HEALTH INSURANCE

## 2013-12-04 ENCOUNTER — Ambulatory Visit (HOSPITAL_COMMUNITY): Payer: PRIVATE HEALTH INSURANCE | Admitting: Oncology

## 2013-12-11 ENCOUNTER — Other Ambulatory Visit (HOSPITAL_COMMUNITY): Payer: Self-pay | Admitting: Oncology

## 2013-12-12 ENCOUNTER — Other Ambulatory Visit (HOSPITAL_COMMUNITY): Payer: Self-pay | Admitting: Hematology and Oncology

## 2013-12-12 DIAGNOSIS — I10 Essential (primary) hypertension: Secondary | ICD-10-CM

## 2013-12-12 DIAGNOSIS — C189 Malignant neoplasm of colon, unspecified: Secondary | ICD-10-CM

## 2013-12-12 MED ORDER — AMLODIPINE BESYLATE-VALSARTAN 5-160 MG PO TABS: 1.0000 | ORAL_TABLET | Freq: Every day | ORAL | Status: DC

## 2013-12-12 MED ORDER — HYDROCODONE-ACETAMINOPHEN 10-325 MG PO TABS: 1.0000 | ORAL_TABLET | Freq: Four times a day (QID) | ORAL | Status: DC | PRN

## 2013-12-16 ENCOUNTER — Encounter (HOSPITAL_COMMUNITY): Payer: Self-pay | Admitting: Oncology

## 2013-12-16 NOTE — Progress Notes (Signed)
Yolanda Paschal, NP No address on file  Metastatic recurrent adenocarcinoma of colon - Plan: ferumoxytol (FERAHEME) 1,020 mg in sodium chloride 0.9 % 100 mL IVPB  Normocytic normochromic anemia - Plan: ferumoxytol (FERAHEME) 1,020 mg in sodium chloride 0.9 % 100 mL IVPB  Low ferritin - Plan: ferumoxytol (FERAHEME) 1,020 mg in sodium chloride 0.9 % 100 mL IVPB  Malabsorption of iron - Plan: ferumoxytol (FERAHEME) 1,020 mg in sodium chloride 0.9 % 100 mL IVPB  CURRENT THERAPY: Comfort care  INTERVAL HISTORY: Yolanda Friedman 63 y.o. female returns for  regular  visit for followup of metastatic adenocarcinoma of colon (KRAS MUTATION DETECTED) with progression despite FOLFOX + Avastin followed by FOLFIRI + Avastin. She was seen by Dr. Leamon Arnt at [facility] who recommended Stivarga versus clinical trial. Patient has opted for treatment with Stivarga prior to enrolling in a study. Stivarga (Regorafenib) 160 mg PO daily x 21 days with 7 day respite. Started ~01/27/2013, CEA on 01/20/2013 was 12.1. In view of side effects, Stivarga dose was reduced to 120 mg days 1-21 with 7 days in August 2014. Patient demanded a 4 week break in therapy and this was granted at the end of Jan 2015.  Patient decided on follow-up visit on 11/05/2013 to stop therapy and transition to comfort care only.    Metastatic recurrent adenocarcinoma of colon   12/06/2010 Initial Diagnosis Metastatic adenocarcinoma of colon by Dr. 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  This was recognized to be invading the pancrease.  The patient was closed.   09/20/2011 - 02/21/2012 Chemotherapy 12 treatments of FOLFIRI + Avastin    09/09/2012 Relapse/Recurrence CT scan showed progression   09/25/2012  - 12/11/2012 Chemotherapy FOLFIRI + Avastin   10/02/2012 Progression CT scans demonstrate progression of disease.   01/27/2013 - 09/23/2013 Chemotherapy Stivarga, dose reduction in August 2014 to 120 mg days 1-21.  Break in therapy provided Jan-Feb 2015.   11/06/2013 Treatment Plan Change Transition to Comfort care at the patient's request.   I personally reviewed and went over laboratory results with the patient.  The results are noted within this dictation.  Labs from 3/5 demonstrates a normocytic, normochromic anemia with a low-normal ferritin at 17 with normal iron studies other than saturation at 14%.  With this information, she is not likely iron deficient, but studies have been demonstrating symptoms of low ferritin with normal Hgb.  Therefore, she is receiving IV Feraheme today to boost ferritin level.  We had two very important discussions today: 1. Code status- she has been thinking about this but she is not yet ready to change her status from Full Code.  I believe she is considering DNR, but she would like more time to consider this.  Fortunately, the [place], Luz Brazen, was present to help with this discussion.  Hiya is agreeable to meet with her to set up advanced directives and living will. 2. Hospice- I discussed Hospice services with her in detail. Hospice has a very negative connotation to [place].  After long discussion regarding Hospice and their services, I learned that her son Yolanda Friedman is very averse to Hospice.  Fortunately, Hospice can provide family support for Yolanda Friedman's family.  She still is not interested in Hospice at this time.  Iriel is concerned about not have blood work performed, even though it is not indicated from an oncology standpoint.  Due to her concerns, I will be glad to perform CBC diffs and CMET with port flushes.  Additionally, I will check a ferritin level in 12 months.  I am concerned about monitoring CEA level for her as this number is  expected to increase in light of her malignancy and this may exacerbate psychologic and emotional issues.  As a result, I will defer this testing to the patient.  She will consider this option.  Oncologically, she otherwise denies any complaints and ROS questioning is negative.    Past Medical History  Diagnosis Date  . Hypertension   . Cancer   . Colon cancer 2005    metastatic 2012  . 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 Diabetes mellitus   . HTN (hypertension) 12/22/2011  . Shortness of breath   . GERD (gastroesophageal reflux disease)   . Anxiety   . Normocytic normochromic anemia 06/26/2013    has Metastatic recurrent adenocarcinoma of colon; HTN (hypertension); CVA (cerebral infarction); and Normocytic normochromic anemia on her problem list.     has No Known Allergies.  Ms. Showman does not currently have medications on file.  Past Surgical History  Procedure Laterality Date  . Cholecystectomy  1987  . Colectomy  2005    colon ca  . Abdominal hysterectomy  12/20/2010    met colon ca  . Portacath placement  01/2011  . Cataract extraction w/phaco  08/08/2012    Procedure: CATARACT EXTRACTION PHACO AND INTRAOCULAR LENS PLACEMENT (IOC);  Surgeon: Tonny Branch, MD;  Location: [facility];  Service: Ophthalmology;  Laterality: Right;  CDE:16.18  . Cataract extraction w/phaco  08/26/2012    Procedure: CATARACT EXTRACTION PHACO AND INTRAOCULAR LENS PLACEMENT (IOC);  Surgeon: Tonny Branch, MD;  Location: [facility];  Service: Ophthalmology;  Laterality: Left;  CDE 7.64    Denies any headaches, dizziness, double vision, fevers, chills, night sweats, nausea, vomiting, diarrhea, constipation, chest pain, heart palpitations, shortness of breath, blood in stool, black tarry stool, urinary pain, urinary burning, urinary frequency, hematuria.   PHYSICAL EXAMINATION  ECOG PERFORMANCE STATUS: 0 - Asymptomatic  There were no vitals filed for this visit.  GENERAL:alert, no  distress, well nourished, well developed, comfortable, cooperative, smiling and accompanied by Luz Brazen. SKIN: skin color, texture, turgor are normal, no rashes or significant lesions HEAD: Normocephalic, No masses, lesions, tenderness or abnormalities EYES: normal, PERRLA, EOMI, Conjunctiva are pink and non-injected EARS: External ears normal OROPHARYNX:mucous membranes are moist  NECK: supple, trachea midline LYMPH:  not examined BREAST:not examined LUNGS: not examined HEART: not examined ABDOMEN:not examined BACK: Back symmetric, no curvature. EXTREMITIES:no cyanosis  NEURO: alert & oriented x 3 with fluent speech, no focal motor/sensory deficits, gait normal   LABORATORY DATA: CBC    Component Value Date/Time   WBC 5.2 11/06/2013 1515   RBC 3.37* 11/06/2013 1515   HGB 10.1* 11/06/2013 1515   HCT 31.7* 11/06/2013 1515   PLT 240 11/06/2013 1515   MCV 94.1 11/06/2013 1515   MCH 30.0 11/06/2013 1515   MCHC 31.9 11/06/2013 1515   RDW 15.5 11/06/2013 1515   LYMPHSABS 2.2 11/06/2013 1515   MONOABS 0.5 11/06/2013 1515   EOSABS 0.1 11/06/2013 1515   BASOSABS 0.0 11/06/2013 1515      Chemistry      Component Value Date/Time   NA 143 11/06/2013 1515   K 3.5* 11/06/2013 1515  CL 104 11/06/2013 1515   CO2 28 11/06/2013 1515   BUN 9 11/06/2013 1515   CREATININE 0.71 11/06/2013 1515      Component Value Date/Time   CALCIUM 9.5 11/06/2013 1515   ALKPHOS 58 11/06/2013 1515   AST 26 11/06/2013 1515   ALT 13 11/06/2013 1515   BILITOT 0.2* 11/06/2013 1515     Lab Results  Component Value Date   IRON 42 11/06/2013   TIBC 299 11/06/2013   FERRITIN 17 11/06/2013      ASSESSMENT:  1. Normocytic, normochromic anemia with low ferritin and iron saturation, otherwise very good iron studies.  This is likely chemotherapy-induced due to bone marrow suppression.  S/P IV Feraheme 1020 mg on 12/18/2013 2. Metastatic adenocarcinoma of colon (KRAS MUTATION DETECTED) with progression despite FOLFOX + Avastin followed by FOLFIRI +  Avastin. She was seen by Dr. Leamon Arnt at [facility] who recommended Stivarga versus clinical trial. Patient has opted for treatment with Stivarga prior to enrolling in a study. Stivarga (Regorafenib) 160 mg PO daily x 21 days with 7 day respite. Started ~01/27/2013, CEA on 01/20/2013 was 12.1. In view of side effects, Stivarga dose was reduced to 120 mg days 1-21 with 7 days in August 2014. Patient demanded a 4 week break in therapy and this was granted at the end of Jan 2015. She has declined any further active therapy and we transitioned to comfort care in March 2015. 3. FULL CODE  Patient Active Problem List   Diagnosis Date Noted  . CVA (cerebral infarction) 06/26/2013  . Normocytic normochromic anemia 06/26/2013  . HTN (hypertension) 12/22/2011  . Metastatic recurrent adenocarcinoma of colon 03/13/2011      PLAN:  1. I personally reviewed and went over laboratory results with the patient.  The results are noted within this dictation. 2. IV Feraheme 1020 mg today 3. Labs at end of May and end of June with Port flushes: CBC diff, CMET, ferritin (in June) 4. Follow-up with PCP as scheduled 5. Discussion regarding code status, she will consider her choices and I suspect she will pursue DNR. 6. Discussion regarding Hospice.  Patient is not interested at this time. 7. Follow-up with Luz Brazen as needed 8. Return in 2 months for follow-up.    THERAPY PLAN:  We will continue to discuss code status and Hospice.  We will manage any oncology/malignancy related issues as they arise and focus on Comfort Care.  All questions were answered. The patient knows to call the clinic with any problems, questions or concerns. We can certainly see the patient much sooner if necessary.  Patient and plan discussed with Dr. Farrel Gobble and he is in agreement with the aforementioned.   [facility] 12/18/2013

## 2013-12-18 ENCOUNTER — Encounter (HOSPITAL_COMMUNITY): Payer: Self-pay | Admitting: Oncology

## 2013-12-18 ENCOUNTER — Encounter (HOSPITAL_COMMUNITY): Payer: PRIVATE HEALTH INSURANCE

## 2013-12-18 ENCOUNTER — Encounter (HOSPITAL_BASED_OUTPATIENT_CLINIC_OR_DEPARTMENT_OTHER): Payer: PRIVATE HEALTH INSURANCE

## 2013-12-18 ENCOUNTER — Encounter (HOSPITAL_COMMUNITY): Payer: PRIVATE HEALTH INSURANCE | Attending: Oncology | Admitting: Oncology

## 2013-12-18 DIAGNOSIS — D508 Other iron deficiency anemias: Secondary | ICD-10-CM

## 2013-12-18 DIAGNOSIS — R799 Abnormal finding of blood chemistry, unspecified: Secondary | ICD-10-CM | POA: Insufficient documentation

## 2013-12-18 DIAGNOSIS — R790 Abnormal level of blood mineral: Secondary | ICD-10-CM

## 2013-12-18 DIAGNOSIS — K9089 Other intestinal malabsorption: Secondary | ICD-10-CM

## 2013-12-18 DIAGNOSIS — C189 Malignant neoplasm of colon, unspecified: Secondary | ICD-10-CM

## 2013-12-18 DIAGNOSIS — D649 Anemia, unspecified: Secondary | ICD-10-CM

## 2013-12-18 DIAGNOSIS — K909 Intestinal malabsorption, unspecified: Secondary | ICD-10-CM

## 2013-12-18 MED ORDER — SODIUM CHLORIDE 0.9 % IV SOLN
Freq: Once | INTRAVENOUS | Status: AC
  Administered 2013-12-18: 13:00:00 via INTRAVENOUS

## 2013-12-18 MED ORDER — SODIUM CHLORIDE 0.9 % IV SOLN
1020.0000 mg | Freq: Once | INTRAVENOUS | Status: AC
  Administered 2013-12-18: 1020 mg via INTRAVENOUS
  Filled 2013-12-18: qty 34

## 2013-12-18 MED ORDER — HEPARIN SOD (PORK) LOCK FLUSH 100 UNIT/ML IV SOLN
INTRAVENOUS | Status: AC
  Filled 2013-12-18: qty 5

## 2013-12-18 MED ORDER — SODIUM CHLORIDE 0.9 % IJ SOLN
10.0000 mL | Freq: Once | INTRAMUSCULAR | Status: AC
  Administered 2013-12-18: 10 mL via INTRAVENOUS

## 2013-12-18 MED ORDER — HEPARIN SOD (PORK) LOCK FLUSH 100 UNIT/ML IV SOLN
500.0000 [IU] | Freq: Once | INTRAVENOUS | Status: AC
  Administered 2013-12-18: 500 [IU] via INTRAVENOUS

## 2013-12-18 NOTE — Patient Instructions (Signed)
[facility] Discharge Instructions  RECOMMENDATIONS MADE BY THE CONSULTANT AND ANY TEST RESULTS WILL BE SENT TO YOUR REFERRING PHYSICIAN. We will see you May 21st for a port flush. You will have another port flush and doctor visit on June 18th. [name] would like to see you before your trip out of town in July.  Thank you for choosing [facility] to provide your oncology and hematology care.  To afford each patient quality time with our providers, please arrive at least 15 minutes before your scheduled appointment time.  With your help, our goal is to use those 15 minutes to complete the necessary work-up to ensure our physicians have the information they need to help with your evaluation and healthcare recommendations.    Effective January 1st, 2014, we ask that you re-schedule your appointment with our physicians should you arrive 10 or more minutes late for your appointment.  We strive to give you quality time with our providers, and arriving late affects you and other patients whose appointments are after yours.    Again, thank you for choosing [facility].  Our hope is that these requests will decrease the amount of time that you wait before being seen by our physicians.       _____________________________________________________________  Should you have questions after your visit to [facility], please contact our office at [phone] between the hours of 8:30 a.m. and 5:00 p.m.  Voicemails left after 4:30 p.m. will not be returned until the following business day.  For prescription refill requests, have your pharmacy contact our office with your prescription refill request.

## 2014-01-12 ENCOUNTER — Other Ambulatory Visit (HOSPITAL_COMMUNITY): Payer: Self-pay | Admitting: Oncology

## 2014-01-20 ENCOUNTER — Encounter (HOSPITAL_COMMUNITY): Payer: PRIVATE HEALTH INSURANCE | Attending: Oncology | Admitting: Oncology

## 2014-01-20 ENCOUNTER — Encounter (HOSPITAL_COMMUNITY): Payer: PRIVATE HEALTH INSURANCE

## 2014-01-20 VITALS — BP 145/86 | HR 89 | Temp 97.9°F | Resp 16 | Wt 174.0 lb

## 2014-01-20 DIAGNOSIS — J069 Acute upper respiratory infection, unspecified: Secondary | ICD-10-CM

## 2014-01-20 DIAGNOSIS — R599 Enlarged lymph nodes, unspecified: Secondary | ICD-10-CM

## 2014-01-20 DIAGNOSIS — L539 Erythematous condition, unspecified: Secondary | ICD-10-CM

## 2014-01-20 MED ORDER — HEPARIN SOD (PORK) LOCK FLUSH 100 UNIT/ML IV SOLN
INTRAVENOUS | Status: AC
Start: 2014-01-20 — End: 2014-01-20
  Filled 2014-01-20: qty 5

## 2014-01-20 MED ORDER — AMOXICILLIN-POT CLAVULANATE 875-125 MG PO TABS: 1.0000 | ORAL_TABLET | Freq: Two times a day (BID) | ORAL | Status: DC

## 2014-01-20 MED ORDER — SODIUM CHLORIDE 0.9 % IJ SOLN
10.0000 mL | INTRAMUSCULAR | Status: DC | PRN
  Administered 2014-01-20: 10 mL via INTRAVENOUS

## 2014-01-20 MED ORDER — HEPARIN SOD (PORK) LOCK FLUSH 100 UNIT/ML IV SOLN
500.0000 [IU] | Freq: Once | INTRAVENOUS | Status: DC
  Administered 2014-01-20: 500 [IU] via INTRAVENOUS

## 2014-01-20 NOTE — Progress Notes (Signed)
The patient is seen as a work-in for a URI.  She was treated with a 10 day course of Amoxicillin.  She feels slightly better, but not much improved.  She is afebrile today in the clinic.  BP 145/86  Pulse 89  Temp(Src) 97.9 F (36.6 C) (Oral)  Resp 16  Wt 174 lb (78.926 kg)  She reports that she saw her primary care provider recently as mentioned above.   She continues to produce yellow sputum.  She denies a sore throat.  She denies any fevers or chills.  She denies any nasal congestions or sinus pressure.   Exam: BP 145/86  Pulse 89  Temp(Src) 97.9 F (36.6 C) (Oral)  Resp 16  Wt 174 lb (78.926 kg) Gen: Not feeling well, but not toxic.  Hoarse voice. HEENT: Posterior pharynx erythema.  No tenderness to palpation of frontal and maxillary sinuses. Neck: Angle of the jaw lymphadenoapthy with some anterior neck cervical lymph nodes, submandibular nodes that are mobile and soft.  Lungs: CTA B/L Skin: Warm and dry Neuro: no focal deficits  Assessment: 1. URI, improved mildly after 10 days of Amoxicillin by PCP. 2. Posterior pharynx erythema 3. Neck adenopathy, reactive to #1.   Plan: 1. Augmetin BID x 10 days, e-scribed to [facility] 2. OTC expectorant 3. Call if not better in 7 days. 4. Return as scheduled for follow-up  Patient and plan discussed with Dr. Farrel Gobble and he is in agreement with the aforementioned.   [facility] 01/20/2014

## 2014-01-20 NOTE — Patient Instructions (Signed)
[facility] Discharge Instructions  RECOMMENDATIONS MADE BY THE CONSULTANT AND ANY TEST RESULTS WILL BE SENT TO YOUR REFERRING PHYSICIAN.  EXAM FINDINGS BY THE PHYSICIAN TODAY AND SIGNS OR SYMPTOMS TO REPORT TO CLINIC OR PRIMARY PHYSICIAN: Exam and findings as discussed by T. Sheldon Silvan, PA-C.  MEDICATIONS PRESCRIBED:  1.  Augmentin as prescribed.  INSTRUCTIONS/FOLLOW-UP: 1.  Please begin using an expectorant as directed for the next 10-14 days as needed.  Mucinex is an option.  There are generics. 2.  Call if you're not feeling better from your upper respiratory symptoms in 1 week.  [phone]. 3.  Please keep your regularly scheduled visits.  Thank you for choosing [facility] to provide your oncology and hematology care.  To afford each patient quality time with our providers, please arrive at least 15 minutes before your scheduled appointment time.  With your help, our goal is to use those 15 minutes to complete the necessary work-up to ensure our physicians have the information they need to help with your evaluation and healthcare recommendations.    Effective January 1st, 2014, we ask that you re-schedule your appointment with our physicians should you arrive 10 or more minutes late for your appointment.  We strive to give you quality time with our providers, and arriving late affects you and other patients whose appointments are after yours.    Again, thank you for choosing [facility].  Our hope is that these requests will decrease the amount of time that you wait before being seen by our physicians.       _____________________________________________________________  Should you have questions after your visit to [facility], please contact our office at [phone] between the hours of 8:30 a.m. and 5:00 p.m.  Voicemails left after 4:30 p.m. will not be returned until the following business day.  For prescription refill  requests, have your pharmacy contact our office with your prescription refill request.

## 2014-01-20 NOTE — Progress Notes (Signed)
Yolanda Friedman presented for Portacath access and flush.  Proper placement of portacath confirmed by CXR.  Portacath located right chest wall accessed with  H 20 needle.  Good blood return present. Portacath flushed with 24ml NS and 500U/23ml Heparin and needle removed intact.  Procedure tolerated well and without incident.

## 2014-01-22 ENCOUNTER — Encounter (HOSPITAL_COMMUNITY): Payer: PRIVATE HEALTH INSURANCE

## 2014-01-29 ENCOUNTER — Encounter (HOSPITAL_COMMUNITY): Payer: PRIVATE HEALTH INSURANCE

## 2014-02-16 ENCOUNTER — Other Ambulatory Visit (HOSPITAL_COMMUNITY): Payer: Self-pay | Admitting: *Deleted

## 2014-02-16 DIAGNOSIS — C189 Malignant neoplasm of colon, unspecified: Secondary | ICD-10-CM

## 2014-02-16 MED ORDER — TEMAZEPAM 15 MG PO CAPS: ORAL_CAPSULE | ORAL | Status: DC

## 2014-02-18 ENCOUNTER — Other Ambulatory Visit (HOSPITAL_COMMUNITY): Payer: Self-pay | Admitting: Hematology and Oncology

## 2014-02-18 DIAGNOSIS — R0609 Other forms of dyspnea: Principal | ICD-10-CM

## 2014-02-18 MED ORDER — ALBUTEROL SULFATE HFA 108 (90 BASE) MCG/ACT IN AERS: 2.0000 | INHALATION_SPRAY | Freq: Four times a day (QID) | RESPIRATORY_TRACT | Status: DC | PRN

## 2014-02-19 ENCOUNTER — Ambulatory Visit (HOSPITAL_COMMUNITY): Payer: PRIVATE HEALTH INSURANCE | Admitting: Oncology

## 2014-02-19 ENCOUNTER — Encounter (HOSPITAL_COMMUNITY): Payer: PRIVATE HEALTH INSURANCE

## 2014-02-26 ENCOUNTER — Telehealth (HOSPITAL_COMMUNITY): Payer: Self-pay | Admitting: Oncology

## 2014-02-26 ENCOUNTER — Other Ambulatory Visit (HOSPITAL_COMMUNITY): Payer: Self-pay | Admitting: Oncology

## 2014-02-26 DIAGNOSIS — C189 Malignant neoplasm of colon, unspecified: Secondary | ICD-10-CM

## 2014-02-26 MED ORDER — HYDROCODONE-ACETAMINOPHEN 10-325 MG PO TABS: 1.0000 | ORAL_TABLET | Freq: Four times a day (QID) | ORAL | Status: DC | PRN

## 2014-03-03 ENCOUNTER — Encounter (HOSPITAL_COMMUNITY): Payer: PRIVATE HEALTH INSURANCE

## 2014-03-16 NOTE — Progress Notes (Signed)
-  Rescheduled-  Yolanda Friedman  

## 2014-03-17 ENCOUNTER — Ambulatory Visit (HOSPITAL_COMMUNITY): Payer: PRIVATE HEALTH INSURANCE | Admitting: Oncology

## 2014-03-17 ENCOUNTER — Encounter (HOSPITAL_COMMUNITY): Payer: PRIVATE HEALTH INSURANCE

## 2014-03-21 NOTE — Progress Notes (Signed)
Yolanda Paschal, NP No address on file  Metastatic recurrent adenocarcinoma of colon - Plan: aspirin 325 MG tablet, Magnesium, CEA, CBC with Differential, Comprehensive metabolic panel, Ferritin, Magnesium, CEA, HYDROcodone-acetaminophen (NORCO) 10-325 MG per tablet  Type 2 diabetes mellitus with hyperglycemia - Plan: metFORMIN (GLUCOPHAGE) 500 MG tablet  Iron deficiency anemia secondary to blood loss (chronic) - Plan: Iron and TIBC, Iron and TIBC  Normocytic normochromic anemia - Plan: Ferritin  Low ferritin - Plan: Ferritin  Malabsorption of iron - Plan: Ferritin  CURRENT THERAPY: Comfort care  INTERVAL HISTORY: Yolanda Friedman 63 y.o. female returns for  regular  visit for followup of metastatic adenocarcinoma of colon (KRAS MUTATION DETECTED) with progression despite FOLFOX + Avastin followed by FOLFIRI + Avastin. She was seen by Dr. Leamon Arnt at [facility] who recommended Stivarga versus clinical trial. Patient has opted for treatment with Stivarga prior to enrolling in a study. Stivarga (Regorafenib) 160 mg PO daily x 21 days with 7 day respite. Started ~01/27/2013, CEA on 01/20/2013 was 12.1. In view of side effects, Stivarga dose was reduced to 120 mg days 1-21 with 7 days in August 2014. Patient demanded a 4 week break in therapy and this was granted at the end of Jan 2015. Patient decided on follow-up visit on 11/05/2013 to stop therapy and transition to comfort care only.    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  This was recognized to be invading the pancrease.  The patient was closed.   09/20/2011 - 02/21/2012  Chemotherapy 12 treatments of FOLFIRI + Avastin    09/09/2012 Relapse/Recurrence CT scan showed progression   09/25/2012 - 12/11/2012 Chemotherapy FOLFIRI + Avastin   10/02/2012 Progression CT scans demonstrate progression of disease.   01/27/2013 - 09/23/2013 Chemotherapy Stivarga, dose reduction in August 2014 to 120 mg days 1-21.  Break in therapy provided Jan-Feb 2015.   11/06/2013 Treatment Plan Change Transition to Comfort care at the patient's request.   I personally reviewed and went over laboratory results with the patient.  The results are noted within this dictation.   She recently moved to [place] with her son and therefore she is looking for a new primary care provider.  In the meantime, I will manage some non-oncologic medications.  I have given her a 90 day supply of Metformin.  She recently had a nurse do an evaluation on her at home and I followed a few recommendations from the nurse regarding additional lab work which we will add today.  Additionally, at the nurses request, I wrote an RX for glucometer and glycemia supplies.  She requested a refill of her pain medication and this was completed.  She also asks about the shingles vaccine.  It would be reasonable to administer.  She wishes for [place] to administer with her next port flush since we need to order the vaccine.  We will therefore give it in 2 months.    Oncologically, she denies any complaints and ROS questioning is negative.  Her QOL is much improved off of therapy.   Past Medical History  Diagnosis Date  . Hypertension   . Cancer   . Colon cancer 2005  metastatic 2012  . 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 Diabetes mellitus   . HTN (hypertension) 12/22/2011  . Shortness of breath   . GERD (gastroesophageal reflux disease)   . Anxiety   . Normocytic normochromic anemia 06/26/2013    has Metastatic recurrent adenocarcinoma of colon; HTN (hypertension); CVA (cerebral infarction); and Normocytic  normochromic anemia on her problem list.     has No Known Allergies.  [name] had no medications administered during this visit.  Past Surgical History  Procedure Laterality Date  . Cholecystectomy  1987  . Colectomy  2005    colon ca  . Abdominal hysterectomy  12/20/2010    met colon ca  . Portacath placement  01/2011  . Cataract extraction w/phaco  08/08/2012    Procedure: CATARACT EXTRACTION PHACO AND INTRAOCULAR LENS PLACEMENT (IOC);  Surgeon: Tonny Branch, MD;  Location: [facility];  Service: Ophthalmology;  Laterality: Right;  CDE:16.18  . Cataract extraction w/phaco  08/26/2012    Procedure: CATARACT EXTRACTION PHACO AND INTRAOCULAR LENS PLACEMENT (IOC);  Surgeon: Tonny Branch, MD;  Location: [facility];  Service: Ophthalmology;  Laterality: Left;  CDE 7.64    Denies any headaches, dizziness, double vision, fevers, chills, night sweats, nausea, vomiting, diarrhea, constipation, chest pain, heart palpitations, shortness of breath, blood in stool, black tarry stool, urinary pain, urinary burning, urinary frequency, hematuria.   PHYSICAL EXAMINATION  ECOG PERFORMANCE STATUS: 1 - Symptomatic but completely ambulatory  Filed Vitals:   03/24/14 1200  BP: 132/68  Pulse: 79  Temp: 97.9 F (36.6 C)  Resp: 18    GENERAL:alert, no distress, well nourished, well developed, comfortable, cooperative, obese and smiling SKIN: skin color, texture, turgor are normal, no rashes or significant lesions HEAD: Normocephalic, No masses, lesions, tenderness or abnormalities EYES: normal, PERRLA, EOMI, Conjunctiva are pink and non-injected EARS: External ears normal OROPHARYNX:mucous membranes are moist  NECK: supple, trachea midline LYMPH:  not examined BREAST:not examined LUNGS: not examined HEART: not examined ABDOMEN:not examined BACK: Back symmetric, no curvature. EXTREMITIES:less then 2 second capillary refill, no skin discoloration, no clubbing  NEURO: alert & oriented x 3 with fluent  speech, no focal motor/sensory deficits, gait normal   LABORATORY DATA: CBC    Component Value Date/Time   WBC 5.2 03/24/2014 1247   RBC 3.59* 03/24/2014 1247   HGB 10.7* 03/24/2014 1247   HCT 33.2* 03/24/2014 1247   PLT 221 03/24/2014 1247   MCV 92.5 03/24/2014 1247   MCH 29.8 03/24/2014 1247   MCHC 32.2 03/24/2014 1247   RDW 14.2 03/24/2014 1247   LYMPHSABS 2.0 03/24/2014 1247   MONOABS 0.3 03/24/2014 1247   EOSABS 0.2 03/24/2014 1247   BASOSABS 0.0 03/24/2014 1247      Chemistry      Component Value Date/Time   NA 140 03/24/2014 1247   K 4.3 03/24/2014 1247   CL 104 03/24/2014 1247   CO2 26 03/24/2014 1247   BUN 12 03/24/2014 1247   CREATININE 0.66 03/24/2014 1247      Component Value Date/Time   CALCIUM 9.5 03/24/2014 1247   ALKPHOS 66 03/24/2014 1247   AST 23 03/24/2014 1247   ALT 12 03/24/2014 1247   BILITOT 0.3 03/24/2014 1247     Lab Results  Component Value Date   FERRITIN 17 11/06/2013      ASSESSMENT:  1. Metastatic adenocarcinoma of colon (KRAS MUTATION DETECTED) with progression despite FOLFOX + Avastin followed by FOLFIRI + Avastin. She was seen by Dr. Leamon Arnt at  [facility] who recommended Stivarga versus clinical trial. Patient has opted for treatment with Stivarga prior to enrolling in a study. Stivarga (Regorafenib) 160 mg PO daily x 21 days with 7 day respite. Started ~01/27/2013, CEA on 01/20/2013 was 12.1. In view of side effects, Stivarga dose was reduced to 120 mg days 1-21 with 7 days in August 2014. Patient demanded a 4 week break in therapy and this was granted at the end of Jan 2015. She has declined any further active therapy and we transitioned to comfort care in March 2015.  2. Normocytic, normochromic anemia with low ferritin and iron saturation, otherwise very good iron studies. This is likely chemotherapy-induced due to bone marrow suppression. S/P IV Feraheme 1020 mg on 12/18/2013  3. FULL CODE  Patient Active Problem List   Diagnosis Date  Noted  . CVA (cerebral infarction) 06/26/2013  . Normocytic normochromic anemia 06/26/2013  . HTN (hypertension) 12/22/2011  . Metastatic recurrent adenocarcinoma of colon 03/13/2011     PLAN:  1. I personally reviewed and went over laboratory results with the patient.  The results are noted within this dictation. 2. Port flushes per protocol 3. Labs today with port flush: CBC diff, CMET, Iron/TIBC, Ferritin, CEA 4. Ascertain a new PCP in [place] is able. 5. Rx for Metformin 6. Rx for Hydrocodone 7. Rx for Glucometer and glucose testing supplies 8. I have called pharmacy for Zostavax vaccine to be given in 2 months 9. Return in 2 months for follow-up   THERAPY PLAN:  We will continue to discuss code status and Hospice. We will manage any oncology/malignancy related issues as they arise and focus on Comfort Care.    All questions were answered. The patient knows to call the clinic with any problems, questions or concerns. We can certainly see the patient much sooner if necessary.  Patient and plan discussed with Dr. Nelida Meuse and he is in agreement with the aforementioned.   Dailee Manalang 03/24/2014

## 2014-03-23 ENCOUNTER — Other Ambulatory Visit (HOSPITAL_COMMUNITY): Payer: Self-pay | Admitting: Hematology and Oncology

## 2014-03-24 ENCOUNTER — Encounter (HOSPITAL_COMMUNITY): Payer: PRIVATE HEALTH INSURANCE | Attending: Oncology | Admitting: Oncology

## 2014-03-24 ENCOUNTER — Encounter (HOSPITAL_COMMUNITY): Payer: Self-pay | Admitting: Oncology

## 2014-03-24 ENCOUNTER — Encounter (HOSPITAL_BASED_OUTPATIENT_CLINIC_OR_DEPARTMENT_OTHER): Payer: PRIVATE HEALTH INSURANCE

## 2014-03-24 VITALS — BP 132/68 | HR 79 | Temp 97.9°F | Resp 18 | Wt 174.8 lb

## 2014-03-24 DIAGNOSIS — E1165 Type 2 diabetes mellitus with hyperglycemia: Secondary | ICD-10-CM

## 2014-03-24 DIAGNOSIS — D649 Anemia, unspecified: Secondary | ICD-10-CM | POA: Diagnosis present

## 2014-03-24 DIAGNOSIS — K9089 Other intestinal malabsorption: Secondary | ICD-10-CM | POA: Insufficient documentation

## 2014-03-24 DIAGNOSIS — C189 Malignant neoplasm of colon, unspecified: Secondary | ICD-10-CM

## 2014-03-24 DIAGNOSIS — R7989 Other specified abnormal findings of blood chemistry: Secondary | ICD-10-CM | POA: Diagnosis present

## 2014-03-24 DIAGNOSIS — E119 Type 2 diabetes mellitus without complications: Secondary | ICD-10-CM | POA: Diagnosis present

## 2014-03-24 DIAGNOSIS — K909 Intestinal malabsorption, unspecified: Secondary | ICD-10-CM

## 2014-03-24 DIAGNOSIS — D500 Iron deficiency anemia secondary to blood loss (chronic): Secondary | ICD-10-CM | POA: Insufficient documentation

## 2014-03-24 DIAGNOSIS — R790 Abnormal level of blood mineral: Secondary | ICD-10-CM

## 2014-03-24 DIAGNOSIS — Z452 Encounter for adjustment and management of vascular access device: Secondary | ICD-10-CM

## 2014-03-24 LAB — COMPREHENSIVE METABOLIC PANEL
ALT: 12 U/L (ref 0–35)
AST: 23 U/L (ref 0–37)
Albumin: 3.8 g/dL (ref 3.5–5.2)
Alkaline Phosphatase: 66 U/L (ref 39–117)
Anion gap: 10 (ref 5–15)
BUN: 12 mg/dL (ref 6–23)
CALCIUM: 9.5 mg/dL (ref 8.4–10.5)
CO2: 26 mEq/L (ref 19–32)
Chloride: 104 mEq/L (ref 96–112)
Creatinine, Ser: 0.66 mg/dL (ref 0.50–1.10)
GFR calc non Af Amer: >90 mL/min (ref >90)
Glucose, Bld: 124 mg/dL — ABNORMAL HIGH (ref 70–99)
Potassium: 4.3 mEq/L (ref 3.7–5.3)
Sodium: 140 mEq/L (ref 137–147)
TOTAL PROTEIN: 6.9 g/dL (ref 6.0–8.3)
Total Bilirubin: 0.3 mg/dL (ref 0.3–1.2)

## 2014-03-24 LAB — CBC WITH DIFFERENTIAL/PLATELET
BASOS ABS: 0 10*3/uL (ref 0.0–0.1)
BASOS PCT: 0 % (ref 0–1)
EOS PCT: 3 % (ref 0–5)
Eosinophils Absolute: 0.2 10*3/uL (ref 0.0–0.7)
HCT: 33.2 % — ABNORMAL LOW (ref 36.0–46.0)
Hemoglobin: 10.7 g/dL — ABNORMAL LOW (ref 12.0–15.0)
Lymphocytes Relative: 38 % (ref 12–46)
Lymphs Abs: 2 10*3/uL (ref 0.7–4.0)
MCH: 29.8 pg (ref 26.0–34.0)
MCHC: 32.2 g/dL (ref 30.0–36.0)
MCV: 92.5 fL (ref 78.0–100.0)
Monocytes Absolute: 0.3 10*3/uL (ref 0.1–1.0)
Monocytes Relative: 6 % (ref 3–12)
Neutro Abs: 2.8 10*3/uL (ref 1.7–7.7)
Neutrophils Relative %: 53 % (ref 43–77)
PLATELETS: 221 10*3/uL (ref 150–400)
RBC: 3.59 MIL/uL — ABNORMAL LOW (ref 3.87–5.11)
RDW: 14.2 % (ref 11.5–15.5)
WBC: 5.2 10*3/uL (ref 4.0–10.5)

## 2014-03-24 LAB — MAGNESIUM: MAGNESIUM: 2 mg/dL (ref 1.5–2.5)

## 2014-03-24 MED ORDER — HEPARIN SOD (PORK) LOCK FLUSH 100 UNIT/ML IV SOLN
500.0000 [IU] | Freq: Once | INTRAVENOUS | Status: AC
  Administered 2014-03-24: 500 [IU] via INTRAVENOUS

## 2014-03-24 MED ORDER — SODIUM CHLORIDE 0.9 % IJ SOLN
10.0000 mL | Freq: Once | INTRAMUSCULAR | Status: AC
  Administered 2014-03-24: 10 mL via INTRAVENOUS

## 2014-03-24 MED ORDER — HYDROCODONE-ACETAMINOPHEN 10-325 MG PO TABS: 1.0000 | ORAL_TABLET | Freq: Four times a day (QID) | ORAL | Status: DC | PRN

## 2014-03-24 MED ORDER — HEPARIN SOD (PORK) LOCK FLUSH 100 UNIT/ML IV SOLN
INTRAVENOUS | Status: AC
Start: 2014-03-24 — End: 2014-03-24
  Filled 2014-03-24: qty 5

## 2014-03-24 MED ORDER — METFORMIN HCL 500 MG PO TABS: 500.0000 mg | ORAL_TABLET | Freq: Every day | ORAL | Status: DC

## 2014-03-24 NOTE — Patient Instructions (Signed)
[facility] Discharge Instructions  RECOMMENDATIONS MADE BY THE CONSULTANT AND ANY TEST RESULTS WILL BE SENT TO YOUR REFERRING PHYSICIAN.  EXAM FINDINGS BY THE PHYSICIAN TODAY AND SIGNS OR SYMPTOMS TO REPORT TO CLINIC OR PRIMARY PHYSICIAN: you saw Yolanda Friedman today  Port flush in 8 weeks with doctors appt  Thank you for choosing [facility] to provide your oncology and hematology care.  To afford each patient quality time with our providers, please arrive at least 15 minutes before your scheduled appointment time.  With your help, our goal is to use those 15 minutes to complete the necessary work-up to ensure our physicians have the information they need to help with your evaluation and healthcare recommendations.    Effective January 1st, 2014, we ask that you re-schedule your appointment with our physicians should you arrive 10 or more minutes late for your appointment.  We strive to give you quality time with our providers, and arriving late affects you and other patients whose appointments are after yours.    Again, thank you for choosing [facility].  Our hope is that these requests will decrease the amount of time that you wait before being seen by our physicians.       _____________________________________________________________  Should you have questions after your visit to [facility], please contact our office at [phone] between the hours of 8:30 a.m. and 5:00 p.m.  Voicemails left after 4:30 p.m. will not be returned until the following business day.  For prescription refill requests, have your pharmacy contact our office with your prescription refill request.

## 2014-03-24 NOTE — Progress Notes (Signed)
Yolanda Friedman presented for Portacath access and flush. Proper placement of portacath confirmed by CXR. Portacath located rt chest wall accessed with  H 20 needle. Good blood return present. Portacath flushed with 51ml NS and 500U/83ml Heparin and needle removed intact. Procedure without incident. Patient tolerated procedure well.

## 2014-03-25 LAB — CEA: CEA: 43.4 ng/mL — ABNORMAL HIGH (ref 0.0–5.0)

## 2014-03-25 LAB — FERRITIN: Ferritin: 26 ng/mL (ref 10–291)

## 2014-03-25 LAB — IRON AND TIBC
IRON: 40 ug/dL — AB (ref 42–135)
SATURATION RATIOS: 13 % — AB (ref 20–55)
TIBC: 314 ug/dL (ref 250–470)
UIBC: 274 ug/dL (ref 125–400)

## 2014-04-27 ENCOUNTER — Other Ambulatory Visit (HOSPITAL_COMMUNITY): Payer: Self-pay | Admitting: Oncology

## 2014-04-27 DIAGNOSIS — C189 Malignant neoplasm of colon, unspecified: Secondary | ICD-10-CM

## 2014-04-27 MED ORDER — OXYCODONE HCL 5 MG PO TABS: 5.0000 mg | ORAL_TABLET | ORAL | Status: DC | PRN

## 2014-05-18 NOTE — Progress Notes (Signed)
Yolanda Paschal, Yolanda Friedman No address on file  Metastatic recurrent adenocarcinoma of colon - Plan: HYDROcodone-acetaminophen (NORCO) 10-325 MG per tablet, heparin lock flush 100 unit/mL, sodium chloride 0.9 % injection 10 mL, CBC with Differential, Comprehensive metabolic panel, CEA  Normocytic normochromic anemia - Plan: heparin lock flush 100 unit/mL, sodium chloride 0.9 % injection 10 mL, Ferritin, Iron and TIBC  Dyspnea on exertion - Plan: albuterol (PROVENTIL HFA;VENTOLIN HFA) 108 (90 BASE) MCG/ACT inhaler, sodium chloride 0.9 % injection 10 mL  URI (upper respiratory infection) - Plan: amoxicillin-clavulanate (AUGMENTIN) 875-125 MG per tablet  CURRENT THERAPY: Comfort care without the assistance of Hospice due to patient's request to wait for Hospice care.  INTERVAL HISTORY: Yolanda Friedman 63 y.o. female returns for  regular  visit for followup of metastatic adenocarcinoma of colon (KRAS MUTATION DETECTED) with progression despite FOLFOX + Avastin followed by FOLFIRI + Avastin. She was seen by Dr. Leamon Arnt at [facility] who recommended Stivarga versus clinical trial. Patient has opted for treatment with Stivarga prior to enrolling in a study. Stivarga (Regorafenib) 160 mg PO daily x 21 days with 7 day respite. Started ~01/27/2013, CEA on 01/20/2013 was 12.1. In view of side effects, Stivarga dose was reduced to 120 mg days 1-21 with 7 days in August 2014. Patient demanded a 4 week break in therapy and this was granted at the end of Jan 2015. Patient decided on follow-up visit on 11/05/2013 to stop therapy and transition to comfort care only.    Metastatic recurrent adenocarcinoma of colon   12/06/2010 Initial Diagnosis Metastatic adenocarcinoma of colon by Dr. 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  This was recognized to be invading the pancrease.  The patient was closed.   09/20/2011 - 02/21/2012 Chemotherapy 12 treatments of FOLFIRI + Avastin    09/09/2012 Relapse/Recurrence CT scan showed progression   09/25/2012 - 12/11/2012 Chemotherapy FOLFIRI + Avastin   10/02/2012 Progression CT scans demonstrate progression of disease.   01/27/2013 - 09/23/2013 Chemotherapy Stivarga, dose reduction in August 2014 to 120 mg days 1-21.  Break in therapy provided Jan-Feb 2015.   11/06/2013 Treatment Plan Change Transition to Comfort care at the patient's request.   I personally reviewed and went over laboratory results with the patient.  The results are noted within this dictation.  She moved to [facility] a few months ago and the drive to [facility] is becoming more difficult for her as she has to arrange transportation with family members. As a result, she has requested to transfer her care to [facility]. She is unaware what part of [place] she lives on in so we looked this up on the Internet. It appears as though her closest [facility] at her new residence is at [facility].  As a result, I discussed the patient's case with Dr. Benay Spice and he is agreed to consume her oncologic care.  Yolanda Friedman is emotionally upset about this move and transition of care to [place].  Fortunately for her, she will remain within the [place] and will benefit from the electronic medical record maintaining continuity of care.  She notes a scratchy throat at night with a clearing of throat that is productive of yellow sputum. She also notes a sputum  production with coughing that is yellow in color as well. She denies any fevers or chills. I will treat this with Augmentin antibiotic for 7 days for a suspected upper respiratory tract infection.  On her last encounter, Yolanda Friedman wished to switch her pain medication to oxycodone  (compared to hydrocodone). She reports that the oxycodone was effective for her but she is afraid of the medication given its connotation within the community.  She requests to go back to hydrocodone.  Oncologically, she denies any complaints of ROS questioning is negative. She continues to have abdominal discomfort that is well-controlled with by mouth medication.  Past Medical History  Diagnosis Date  . Hypertension   . Cancer   . Colon cancer 2005    metastatic 2012  . 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 Diabetes mellitus   . HTN (hypertension) 12/22/2011  . Shortness of breath   . GERD (gastroesophageal reflux disease)   . Anxiety   . Normocytic normochromic anemia 06/26/2013    has Metastatic recurrent adenocarcinoma of colon; HTN (hypertension); CVA (cerebral infarction); and Normocytic normochromic anemia on her problem list.     has No Known Allergies.  We administered Influenza vac split quadrivalent PF.  Past Surgical History  Procedure Laterality Date  . Cholecystectomy  1987  . Colectomy  2005    colon ca  . Abdominal hysterectomy  12/20/2010    met colon ca  . Portacath placement  01/2011  . Cataract extraction w/phaco  08/08/2012    Procedure: CATARACT EXTRACTION PHACO AND INTRAOCULAR LENS PLACEMENT (IOC);  Surgeon: Tonny Branch, MD;  Location: [facility];  Service: Ophthalmology;  Laterality: Right;  CDE:16.18  . Cataract extraction w/phaco  08/26/2012    Procedure: CATARACT EXTRACTION PHACO AND INTRAOCULAR LENS PLACEMENT (IOC);  Surgeon: Tonny Branch, MD;  Location: [facility];  Service: Ophthalmology;  Laterality: Left;  CDE 7.64    Denies any headaches, dizziness, double vision, fevers, chills, night sweats, nausea, vomiting, diarrhea, constipation, chest pain, heart palpitations, shortness of breath, blood in stool, black tarry stool, urinary pain, urinary burning, urinary frequency, hematuria.   PHYSICAL EXAMINATION  ECOG PERFORMANCE STATUS: 1 -  Symptomatic but completely ambulatory  Filed Vitals:   05/19/14 1100  BP: 151/71  Pulse: 68  Temp: 97.8 F (36.6 C)  Resp: 18    GENERAL:alert, no distress, well nourished, well developed, comfortable, cooperative and smiling SKIN: skin color, texture, turgor are normal, no rashes or significant lesions HEAD: Normocephalic, No masses, lesions, tenderness or abnormalities.  No tenderness to palpation of frontal and maxillary sinuses. EYES: normal, PERRLA, EOMI, Conjunctiva are pink and non-injected EARS: External ears normal OROPHARYNX:mucous membranes are moist, with posterior pharynx erythema NECK: supple, thyroid normal size, non-tender, without nodularity, no stridor, non-tender, trachea midline LYMPH:  no palpable lymphadenopathy BREAST:not examined LUNGS: clear to auscultation  HEART: regular rate & rhythm, no murmurs and no gallops ABDOMEN:abdomen soft, non-tender, obese and normal bowel sounds BACK: Back symmetric, no curvature. EXTREMITIES:less then 2 second capillary refill, no joint deformities, effusion, or inflammation, no skin discoloration, no cyanosis  NEURO: alert & oriented x 3 with fluent speech, no focal motor/sensory deficits, gait normal   LABORATORY DATA: CBC    Component Value Date/Time   WBC 5.2 03/24/2014 1247   RBC 3.59* 03/24/2014 1247   HGB 10.7* 03/24/2014 1247   HCT 33.2* 03/24/2014 1247   PLT 221 03/24/2014 1247   MCV 92.5 03/24/2014 1247   MCH 29.8 03/24/2014 1247   MCHC 32.2  03/24/2014 1247   RDW 14.2 03/24/2014 1247   LYMPHSABS 2.0 03/24/2014 1247   MONOABS 0.3 03/24/2014 1247   EOSABS 0.2 03/24/2014 1247   BASOSABS 0.0 03/24/2014 1247      Chemistry      Component Value Date/Time   NA 140 03/24/2014 1247   K 4.3 03/24/2014 1247   CL 104 03/24/2014 1247   CO2 26 03/24/2014 1247   BUN 12 03/24/2014 1247   CREATININE 0.66 03/24/2014 1247      Component Value Date/Time   CALCIUM 9.5 03/24/2014 1247   ALKPHOS 66 03/24/2014 1247   AST 23 03/24/2014  1247   ALT 12 03/24/2014 1247   BILITOT 0.3 03/24/2014 1247     Lab Results  Component Value Date   IRON 40* 03/24/2014   TIBC 314 03/24/2014   FERRITIN 26 03/24/2014   Lab Results  Component Value Date   CEA 43.4* 03/24/2014     ASSESSMENT:  1. Metastatic adenocarcinoma of colon (KRAS MUTATION DETECTED) with progression despite FOLFOX + Avastin followed by FOLFIRI + Avastin. She was seen by Dr. Leamon Arnt at [facility] who recommended Stivarga versus clinical trial. Patient has opted for treatment with Stivarga prior to enrolling in a study. Stivarga (Regorafenib) 160 mg PO daily x 21 days with 7 day respite. Started ~01/27/2013, CEA on 01/20/2013 was 12.1. In view of side effects, Stivarga dose was reduced to 120 mg days 1-21 with 7 days in August 2014. Patient demanded a 4 week break in therapy and this was granted at the end of Jan 2015. She has declined any further active therapy and we transitioned to comfort care in March 2015.  2. Normocytic, normochromic anemia with low ferritin and iron saturation, otherwise very good iron studies. This is likely chemotherapy-induced due to bone marrow suppression. S/P IV Feraheme 1020 mg on 12/18/2013  3. FULL CODE, despite education regarding her prognosis 4. URI with cough and yellow sputum production.  Patient Active Problem List   Diagnosis Date Noted  . CVA (cerebral infarction) 06/26/2013  . Normocytic normochromic anemia 06/26/2013  . HTN (hypertension) 12/22/2011  . Metastatic recurrent adenocarcinoma of colon 03/13/2011     PLAN:  1. I personally reviewed and went over laboratory results with the patient.  The results are noted within this dictation. 2. Influenza vaccine today 3. Refill on Albuterol inhalers 4. Rx for Augmentin x 7 days 5. Discussion with patient regarding her desires for future follow-up.  She wishes to be seen by oncology in [place].  She still refuses Hospice care 6. I have discussed the case with  Dr. Benay Spice and he has agreed to see the patient in follow-up. 7. She will need a port flush when she is seen by Dr. Benay Spice 8. Port flush today. 9. No role for labs from an oncology standpoint, but the patient requests labs: CBC diff, CMET, iron/TIBC, Ferritin, CEA 10. Rx for Hydrocodone 10-325 mg #100 for pain control 11. Release the patient from the [facility] to follow-up with Dr. Benay Spice.  We will provide oncologic care in the interim until she is seen by Dr. Benay Spice in 6 -8 weeks.  THERAPY PLAN:  Palliative care.  She wishes to transfer her care to oncology in [place] since she has moved to the [place] area from [place].  Discussed with Dr. Benay Spice and he has agreed to consume her oncologic care.    All questions were answered. The patient knows to call the clinic with any  problems, questions or concerns. We can certainly see the patient much sooner if necessary.  Patient and plan discussed with Dr. Farrel Gobble and he is in agreement with the aforementioned.   Dalaysia Harms 05/19/2014

## 2014-05-19 ENCOUNTER — Encounter (HOSPITAL_COMMUNITY): Payer: PRIVATE HEALTH INSURANCE

## 2014-05-19 ENCOUNTER — Encounter (HOSPITAL_COMMUNITY): Payer: PRIVATE HEALTH INSURANCE | Attending: Oncology | Admitting: Oncology

## 2014-05-19 ENCOUNTER — Encounter (HOSPITAL_COMMUNITY): Payer: Self-pay | Admitting: Oncology

## 2014-05-19 ENCOUNTER — Other Ambulatory Visit (HOSPITAL_COMMUNITY): Payer: Self-pay | Admitting: Oncology

## 2014-05-19 VITALS — BP 151/71 | HR 68 | Temp 97.8°F | Resp 18 | Wt 182.0 lb

## 2014-05-19 DIAGNOSIS — E119 Type 2 diabetes mellitus without complications: Secondary | ICD-10-CM | POA: Insufficient documentation

## 2014-05-19 DIAGNOSIS — J069 Acute upper respiratory infection, unspecified: Secondary | ICD-10-CM

## 2014-05-19 DIAGNOSIS — D500 Iron deficiency anemia secondary to blood loss (chronic): Secondary | ICD-10-CM | POA: Diagnosis present

## 2014-05-19 DIAGNOSIS — R7989 Other specified abnormal findings of blood chemistry: Secondary | ICD-10-CM | POA: Diagnosis present

## 2014-05-19 DIAGNOSIS — K9089 Other intestinal malabsorption: Secondary | ICD-10-CM | POA: Diagnosis present

## 2014-05-19 DIAGNOSIS — R0609 Other forms of dyspnea: Secondary | ICD-10-CM

## 2014-05-19 DIAGNOSIS — C189 Malignant neoplasm of colon, unspecified: Secondary | ICD-10-CM | POA: Diagnosis present

## 2014-05-19 DIAGNOSIS — R0600 Dyspnea, unspecified: Secondary | ICD-10-CM

## 2014-05-19 DIAGNOSIS — D509 Iron deficiency anemia, unspecified: Secondary | ICD-10-CM

## 2014-05-19 DIAGNOSIS — D649 Anemia, unspecified: Secondary | ICD-10-CM | POA: Insufficient documentation

## 2014-05-19 DIAGNOSIS — Z23 Encounter for immunization: Secondary | ICD-10-CM

## 2014-05-19 LAB — CBC WITH DIFFERENTIAL/PLATELET
Basophils Absolute: 0 10*3/uL (ref 0.0–0.1)
Basophils Relative: 0 % (ref 0–1)
EOS ABS: 0.1 10*3/uL (ref 0.0–0.7)
EOS PCT: 2 % (ref 0–5)
HEMATOCRIT: 32.6 % — AB (ref 36.0–46.0)
HEMOGLOBIN: 10.4 g/dL — AB (ref 12.0–15.0)
LYMPHS ABS: 1.9 10*3/uL (ref 0.7–4.0)
Lymphocytes Relative: 32 % (ref 12–46)
MCH: 28.9 pg (ref 26.0–34.0)
MCHC: 31.9 g/dL (ref 30.0–36.0)
MCV: 90.6 fL (ref 78.0–100.0)
MONO ABS: 0.4 10*3/uL (ref 0.1–1.0)
Monocytes Relative: 6 % (ref 3–12)
Neutro Abs: 3.4 10*3/uL (ref 1.7–7.7)
Neutrophils Relative %: 60 % (ref 43–77)
Platelets: 249 10*3/uL (ref 150–400)
RBC: 3.6 MIL/uL — AB (ref 3.87–5.11)
RDW: 14.2 % (ref 11.5–15.5)
WBC: 5.8 10*3/uL (ref 4.0–10.5)

## 2014-05-19 LAB — COMPREHENSIVE METABOLIC PANEL
ALK PHOS: 71 U/L (ref 39–117)
ALT: 13 U/L (ref 0–35)
ANION GAP: 10 (ref 5–15)
AST: 24 U/L (ref 0–37)
Albumin: 3.6 g/dL (ref 3.5–5.2)
BUN: 14 mg/dL (ref 6–23)
CO2: 28 mEq/L (ref 19–32)
Calcium: 9.6 mg/dL (ref 8.4–10.5)
Chloride: 105 mEq/L (ref 96–112)
Creatinine, Ser: 0.66 mg/dL (ref 0.50–1.10)
Glucose, Bld: 102 mg/dL — ABNORMAL HIGH (ref 70–99)
Potassium: 4 mEq/L (ref 3.7–5.3)
Sodium: 143 mEq/L (ref 137–147)
TOTAL PROTEIN: 6.9 g/dL (ref 6.0–8.3)
Total Bilirubin: 0.2 mg/dL — ABNORMAL LOW (ref 0.3–1.2)

## 2014-05-19 LAB — IRON AND TIBC
Iron: 37 ug/dL — ABNORMAL LOW (ref 42–135)
Saturation Ratios: 11 % — ABNORMAL LOW (ref 20–55)
TIBC: 342 ug/dL (ref 250–470)
UIBC: 305 ug/dL (ref 125–400)

## 2014-05-19 MED ORDER — SODIUM CHLORIDE 0.9 % IJ SOLN
10.0000 mL | INTRAMUSCULAR | Status: DC | PRN
  Administered 2014-05-19: 10 mL via INTRAVENOUS

## 2014-05-19 MED ORDER — LORAZEPAM 1 MG PO TABS: 1.0000 mg | ORAL_TABLET | ORAL | Status: DC | PRN

## 2014-05-19 MED ORDER — INFLUENZA VAC SPLIT QUAD 0.5 ML IM SUSY
0.5000 mL | PREFILLED_SYRINGE | Freq: Once | INTRAMUSCULAR | Status: AC
  Administered 2014-05-19: 0.5 mL via INTRAMUSCULAR
  Filled 2014-05-19: qty 0.5

## 2014-05-19 MED ORDER — HYDROCODONE-ACETAMINOPHEN 10-325 MG PO TABS: 1.0000 | ORAL_TABLET | Freq: Four times a day (QID) | ORAL | Status: DC | PRN

## 2014-05-19 MED ORDER — ALBUTEROL SULFATE HFA 108 (90 BASE) MCG/ACT IN AERS
2.0000 | INHALATION_SPRAY | Freq: Four times a day (QID) | RESPIRATORY_TRACT | Status: AC | PRN
Start: 2014-05-19 — End: ?

## 2014-05-19 MED ORDER — AMOXICILLIN-POT CLAVULANATE 875-125 MG PO TABS: 1.0000 | ORAL_TABLET | Freq: Two times a day (BID) | ORAL | Status: DC

## 2014-05-19 MED ORDER — HEPARIN SOD (PORK) LOCK FLUSH 100 UNIT/ML IV SOLN
500.0000 [IU] | Freq: Once | INTRAVENOUS | Status: AC
  Administered 2014-05-19: 500 [IU] via INTRAVENOUS
  Filled 2014-05-19: qty 5

## 2014-05-19 NOTE — Progress Notes (Signed)
Please see office visit note.

## 2014-05-19 NOTE — Patient Instructions (Addendum)
[facility] Discharge Instructions  RECOMMENDATIONS MADE BY THE CONSULTANT AND ANY TEST RESULTS WILL BE SENT TO YOUR REFERRING PHYSICIAN.  EXAM FINDINGS BY THE PHYSICIAN TODAY AND SIGNS OR SYMPTOMS TO REPORT TO CLINIC OR PRIMARY PHYSICIAN: Exam and findings as discussed by Robynn Pane, PA-C.  You are doing well.  We will check some labs today and will let you know of any concerns.  Flu vaccine and port flush today.  MEDICATIONS PRESCRIBED:  Albuterol - use as prescribed Hydrocodone - take as directed Augmentin - take as directed  INSTRUCTIONS/FOLLOW-UP: Will refer you to Dr. Benay Spice at [facility] at [facility].  Thank you for choosing [facility] to provide your oncology and hematology care.  To afford each patient quality time with our providers, please arrive at least 15 minutes before your scheduled appointment time.  With your help, our goal is to use those 15 minutes to complete the necessary work-up to ensure our physicians have the information they need to help with your evaluation and healthcare recommendations.    Effective January 1st, 2014, we ask that you re-schedule your appointment with our physicians should you arrive 10 or more minutes late for your appointment.  We strive to give you quality time with our providers, and arriving late affects you and other patients whose appointments are after yours.    Again, thank you for choosing [facility].  Our hope is that these requests will decrease the amount of time that you wait before being seen by our physicians.       _____________________________________________________________  Should you have questions after your visit to [facility], please contact our office at [phone] between the hours of 8:30 a.m. and 4:30 p.m.  Voicemails left after 4:30 p.m. will not be returned until the following business day.  For prescription refill  requests, have your pharmacy contact our office with your prescription refill request.    _______________________________________________________________  We hope that we have given you very good care.  You may receive a patient satisfaction survey in the mail, please complete it and return it as soon as possible.  We value your feedback!  _______________________________________________________________  Have you asked about our STAR program?  STAR stands for Survivorship Training and Rehabilitation, and this is a nationally recognized cancer care program that focuses on survivorship and rehabilitation.  Cancer and cancer treatments may cause problems, such as, pain, making you feel tired and keeping you from doing the things that you need or want to do. Cancer rehabilitation can help. Our goal is to reduce these troubling effects and help you have the best quality of life possible.  You may receive a survey from a nurse that asks questions about your current state of health.  Based on the survey results, all eligible patients will be referred to the [facility] program for an evaluation so we can better serve you!  A frequently asked questions sheet is available upon request.

## 2014-05-19 NOTE — Progress Notes (Signed)
Yolanda Friedman presented for labwork. Labs per MD order drawn via Portacath located in the right chest wall accessed with  H 20 needle. Good blood return present. Procedure without incident.  Needle removed intact. Patient tolerated procedure well.

## 2014-05-20 ENCOUNTER — Other Ambulatory Visit (HOSPITAL_COMMUNITY): Payer: Self-pay | Admitting: Oncology

## 2014-05-20 LAB — FERRITIN: Ferritin: 9 ng/mL — ABNORMAL LOW (ref 10–291)

## 2014-05-20 LAB — CEA: CEA: 52.4 ng/mL — ABNORMAL HIGH (ref 0.0–5.0)

## 2014-05-21 ENCOUNTER — Telehealth: Payer: Self-pay | Admitting: Oncology

## 2014-05-21 ENCOUNTER — Telehealth (HOSPITAL_COMMUNITY): Payer: Self-pay

## 2014-05-21 ENCOUNTER — Other Ambulatory Visit (HOSPITAL_COMMUNITY): Payer: Self-pay | Admitting: Oncology

## 2014-05-21 DIAGNOSIS — D509 Iron deficiency anemia, unspecified: Secondary | ICD-10-CM

## 2014-05-21 MED ORDER — FERROUS FUMARATE 325 (106 FE) MG PO TABS: 1.0000 | ORAL_TABLET | Freq: Two times a day (BID) | ORAL | Status: DC

## 2014-05-21 NOTE — Telephone Encounter (Signed)
Wants rx called to whatever [facility] in [place] that is close to [facility]

## 2014-05-21 NOTE — Telephone Encounter (Signed)
S/W PATIENT AND GAVE NP FOR 10/29 @ 1:30 W/DR. SHERRILL.  REFERRING DR. Robynn Pane DX-MALIGNANT NEOPLASM OF COLON

## 2014-05-21 NOTE — Telephone Encounter (Signed)
Message copied by Mellissa Kohut on Thu May 21, 2014  4:14 PM ------      Message from: [facility]      Created: Wed May 20, 2014  4:22 PM       Spoke to Dr. Benay Spice.  He would like her to take hemocyte (iron) PO and he will see her as scheduled in 6 weeks (instead of doing IV Feraheme).  We need to find out what pharmacy she would like [place] to send that Rx to in [place]. ------

## 2014-05-21 NOTE — Telephone Encounter (Signed)
C/D 05/21/14 for appt. 07/02/14

## 2014-05-25 ENCOUNTER — Telehealth (HOSPITAL_COMMUNITY): Payer: Self-pay | Admitting: Oncology

## 2014-05-25 NOTE — Telephone Encounter (Signed)
No, I called it in that day.  Do I need to call it in again?

## 2014-07-02 ENCOUNTER — Encounter: Payer: Self-pay | Admitting: Oncology

## 2014-07-02 ENCOUNTER — Ambulatory Visit: Payer: PRIVATE HEALTH INSURANCE

## 2014-07-02 ENCOUNTER — Other Ambulatory Visit: Payer: Self-pay | Admitting: *Deleted

## 2014-07-02 ENCOUNTER — Ambulatory Visit (HOSPITAL_BASED_OUTPATIENT_CLINIC_OR_DEPARTMENT_OTHER): Payer: PRIVATE HEALTH INSURANCE | Admitting: Oncology

## 2014-07-02 VITALS — BP 138/71 | HR 93 | Temp 97.9°F | Resp 18 | Ht 60.0 in | Wt 185.5 lb

## 2014-07-02 DIAGNOSIS — Z95828 Presence of other vascular implants and grafts: Secondary | ICD-10-CM

## 2014-07-02 DIAGNOSIS — C7989 Secondary malignant neoplasm of other specified sites: Secondary | ICD-10-CM

## 2014-07-02 DIAGNOSIS — C189 Malignant neoplasm of colon, unspecified: Secondary | ICD-10-CM

## 2014-07-02 DIAGNOSIS — E119 Type 2 diabetes mellitus without complications: Secondary | ICD-10-CM | POA: Insufficient documentation

## 2014-07-02 DIAGNOSIS — I10 Essential (primary) hypertension: Secondary | ICD-10-CM

## 2014-07-02 DIAGNOSIS — D509 Iron deficiency anemia, unspecified: Secondary | ICD-10-CM

## 2014-07-02 DIAGNOSIS — Z8673 Personal history of transient ischemic attack (TIA), and cerebral infarction without residual deficits: Secondary | ICD-10-CM

## 2014-07-02 MED ORDER — SODIUM CHLORIDE 0.9 % IJ SOLN
10.0000 mL | INTRAMUSCULAR | Status: DC | PRN
  Administered 2014-07-02: 10 mL via INTRAVENOUS
  Filled 2014-07-02: qty 10

## 2014-07-02 MED ORDER — HYDROCODONE-ACETAMINOPHEN 10-325 MG PO TABS: 1.0000 | ORAL_TABLET | Freq: Four times a day (QID) | ORAL | Status: DC | PRN

## 2014-07-02 MED ORDER — POTASSIUM CHLORIDE ER 10 MEQ PO TBCR: 10.0000 meq | EXTENDED_RELEASE_TABLET | Freq: Every day | ORAL | Status: DC

## 2014-07-02 MED ORDER — HEPARIN SOD (PORK) LOCK FLUSH 100 UNIT/ML IV SOLN
500.0000 [IU] | Freq: Once | INTRAVENOUS | Status: AC
  Administered 2014-07-02: 500 [IU] via INTRAVENOUS
  Filled 2014-07-02: qty 5

## 2014-07-02 MED ORDER — AMLODIPINE BESYLATE-VALSARTAN 5-160 MG PO TABS: 1.0000 | ORAL_TABLET | Freq: Every day | ORAL | Status: DC

## 2014-07-02 NOTE — Progress Notes (Signed)
[facility] Patient Consult   Referring MD: Kirby Crigler, [place]  Yolanda Friedman 63 y.o.  06-12-1951    Reason for Referral: Colon cancer   HPI: [name] was diagnosed with colon cancer in 2005 living in [place]. She reports undergoing surgery followed by adjuvant chemotherapy. In 2012 she was diagnosed with metastatic colon cancer when she presented with carcinomatosis. She was treated with FOLFOX/Avastin chemotherapy from may 2012 through October 2012. In December 2012 she underwent HIPEC at [facility]. Unresectable tumor was noted in the lesser sac invading the pancreas. The HIPEC was aborted. She completed 12 treatments with FOLFIRI/Avastin from 09/20/2011 through 02/20/2013. In January 2014 a CT showed disease progression. Chemotherapy was resumed with FOLFIRI/Avastin between 09/25/2012 and 12/11/2012. CTs in April 2014 revealed disease progression. She was referred to Dr. Leamon Arnt and treatment with regorafenib was recommended. She was maintained on regorafenib until January 2015. Treatment was discontinued in January 2015. [name] decided on comfort care beginning 11/06/2013. She has relocated to [facility] and is referred to continue oncology care.  She has not establish with a primary care physician.  Past Medical History  Diagnosis Date  . Hypertension   .  herpes zoster at the left lateral chest   2005   . Colon cancer 2005    metastatic 2012  . 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 Diabetes mellitus   . HTN (hypertension) 12/22/2011  .  G1 P1    . GERD (gastroesophageal reflux disease)   . Anxiety   . Normocytic normochromic anemia 06/26/2013    Past Surgical History  Procedure Laterality Date  . Cholecystectomy  1987  . Colectomy  2005    colon ca  . Abdominal hysterectomy  12/20/2010    met colon ca  . Portacath placement  01/2011  . Cataract extraction w/phaco  08/08/2012    Procedure: CATARACT EXTRACTION PHACO AND INTRAOCULAR  LENS PLACEMENT (IOC);  Surgeon: Tonny Branch, MD;  Location: [facility];  Service: Ophthalmology;  Laterality: Right;  CDE:16.18  . Cataract extraction w/phaco  08/26/2012    Procedure: CATARACT EXTRACTION PHACO AND INTRAOCULAR LENS PLACEMENT (IOC);  Surgeon: Tonny Branch, MD;  Location: [facility];  Service: Ophthalmology;  Laterality: Left;  CDE 7.64    Medications: Reviewed  Allergies: No Known Allergies  Family history: Her mother had esophagus cancer. No other family history of cancer.  Social History:   She lives with her son in [place]. She previously worked as a Sports coach. She does not use cigarettes. She smokes marijuana. She drinks beer and wine occasionally. She has a history of drug use in the past. No risk factor for HIV or hepatitis. She reports receiving a transfusion at the time of diagnosis in 2005.   ROS:   Positives include: Chronic nausea, diffuse abdominal pain, popping sound in the right ear  A complete ROS was otherwise negative.  Physical Exam:  Blood pressure 138/71, pulse 93, temperature 97.9 F (36.6 C), temperature source Oral, resp. rate 18, height 5' (1.524 m), weight 185 lb 8 oz (84.142 kg), SpO2 100.00%.  HEENT: Oropharynx without visible mass, neck without mass Lungs: Clear bilaterally Cardiac: Regular rate and rhythm Abdomen: Mildly distended, no discrete mass, nontender, no hepatomegaly, no apparent ascites  Vascular: No leg edema Lymph nodes: No cervical, supraclavicular, axillary, or inguinal nodes Neurologic: Alert and oriented, the motor exam appears intact in the upper and lower extremities Skin: No rash Musculoskeletal: No spine tenderness   LAB:  CBC  Lab Results  Component Value Date   WBC 5.8 05/19/2014   HGB 10.4* 05/19/2014   HCT 32.6* 05/19/2014   MCV 90.6 05/19/2014   PLT 249 05/19/2014   NEUTROABS 3.4 05/19/2014     CMP      Component Value Date/Time   NA 143 05/19/2014 1243   K 4.0 05/19/2014 1243   CL 105 05/19/2014 1243    CO2 28 05/19/2014 1243   GLUCOSE 102* 05/19/2014 1243   BUN 14 05/19/2014 1243   CREATININE 0.66 05/19/2014 1243   CALCIUM 9.6 05/19/2014 1243   PROT 6.9 05/19/2014 1243   ALBUMIN 3.6 05/19/2014 1243   AST 24 05/19/2014 1243   ALT 13 05/19/2014 1243   ALKPHOS 71 05/19/2014 1243   BILITOT 0.2* 05/19/2014 1243   GFRNONAA >90 05/19/2014 1243   GFRAA >90 05/19/2014 1243    Lab Results  Component Value Date   CEA 52.4* 05/19/2014   ferritin-9      Assessment/Plan:   1. Stage IV colon cancer, K-ras mutation detected  Initial diagnosis 2005  Recurrent disease April 2012, status post a hysterectomy, bilateral salpingo-oophorectomy, omentectomy, and radical debulking by Dr. 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    Diabetes  3.    Hypertension  4.    iron deficiency anemia-maintained on iron  5.    CVA October 2014   Disposition:   [name] has metastatic colon cancer. She has been treated with multiple systemic therapies, most recently regorafenib. Treatment was discontinued in January of 2015. She has elected to proceed with a comfort care approach. She appears well today and has minimal symptoms related to the metastatic tumor burden.  The Port-A-Cath was flushed today. She will return for an office visit and Port-A-Cath flush in 6 weeks. We will check the hemoglobin that day.  We made a referral to establish with a primary physician for management of diabetes and hypertension.    [place], [place] 07/02/2014, 2:36 PM

## 2014-07-02 NOTE — Patient Instructions (Signed)

## 2014-07-03 ENCOUNTER — Telehealth: Payer: Self-pay | Admitting: Oncology

## 2014-07-03 NOTE — Telephone Encounter (Signed)
s.w. pt and advised on Dec appt...pt ok adn aware....pt awareto cal lebaur for primary care

## 2014-08-13 ENCOUNTER — Telehealth: Payer: Self-pay | Admitting: Nurse Practitioner

## 2014-08-13 ENCOUNTER — Ambulatory Visit (HOSPITAL_BASED_OUTPATIENT_CLINIC_OR_DEPARTMENT_OTHER): Payer: PRIVATE HEALTH INSURANCE

## 2014-08-13 ENCOUNTER — Other Ambulatory Visit (HOSPITAL_BASED_OUTPATIENT_CLINIC_OR_DEPARTMENT_OTHER): Payer: PRIVATE HEALTH INSURANCE

## 2014-08-13 ENCOUNTER — Ambulatory Visit (HOSPITAL_BASED_OUTPATIENT_CLINIC_OR_DEPARTMENT_OTHER): Payer: PRIVATE HEALTH INSURANCE | Admitting: Nurse Practitioner

## 2014-08-13 VITALS — BP 120/64 | HR 75 | Temp 98.2°F | Resp 18 | Ht 60.0 in | Wt 180.9 lb

## 2014-08-13 DIAGNOSIS — Z452 Encounter for adjustment and management of vascular access device: Secondary | ICD-10-CM

## 2014-08-13 DIAGNOSIS — Z95828 Presence of other vascular implants and grafts: Secondary | ICD-10-CM

## 2014-08-13 DIAGNOSIS — D509 Iron deficiency anemia, unspecified: Secondary | ICD-10-CM

## 2014-08-13 DIAGNOSIS — C189 Malignant neoplasm of colon, unspecified: Secondary | ICD-10-CM

## 2014-08-13 LAB — CBC WITH DIFFERENTIAL/PLATELET
BASO%: 0.2 % (ref 0.0–2.0)
Basophils Absolute: 0 10*3/uL (ref 0.0–0.1)
EOS%: 2.2 % (ref 0.0–7.0)
Eosinophils Absolute: 0.1 10*3/uL (ref 0.0–0.5)
HCT: 35.5 % (ref 34.8–46.6)
HGB: 11.5 g/dL — ABNORMAL LOW (ref 11.6–15.9)
LYMPH%: 35 % (ref 14.0–49.7)
MCH: 29.1 pg (ref 25.1–34.0)
MCHC: 32.4 g/dL (ref 31.5–36.0)
MCV: 89.9 fL (ref 79.5–101.0)
MONO#: 0.4 10*3/uL (ref 0.1–0.9)
MONO%: 6.6 % (ref 0.0–14.0)
NEUT#: 3.1 10*3/uL (ref 1.5–6.5)
NEUT%: 56 % (ref 38.4–76.8)
Platelets: 205 10*3/uL (ref 145–400)
RBC: 3.95 10*6/uL (ref 3.70–5.45)
RDW: 14.5 % (ref 11.2–14.5)
WBC: 5.5 10*3/uL (ref 3.9–10.3)
lymph#: 1.9 10*3/uL (ref 0.9–3.3)

## 2014-08-13 MED ORDER — HEPARIN SOD (PORK) LOCK FLUSH 100 UNIT/ML IV SOLN
500.0000 [IU] | Freq: Once | INTRAVENOUS | Status: AC
  Administered 2014-08-13: 500 [IU] via INTRAVENOUS
  Filled 2014-08-13: qty 5

## 2014-08-13 MED ORDER — SODIUM CHLORIDE 0.9 % IJ SOLN
10.0000 mL | INTRAMUSCULAR | Status: DC | PRN
  Administered 2014-08-13: 10 mL via INTRAVENOUS
  Filled 2014-08-13: qty 10

## 2014-08-13 MED ORDER — HYDROCODONE-ACETAMINOPHEN 10-325 MG PO TABS: 1.0000 | ORAL_TABLET | Freq: Four times a day (QID) | ORAL | Status: DC | PRN

## 2014-08-13 NOTE — Telephone Encounter (Signed)
Pt confirmed labs/ov per 12/10 POF, gave pt AVS..... KJ

## 2014-08-13 NOTE — Progress Notes (Signed)
  [facility] OFFICE PROGRESS NOTE   Diagnosis:  Colon cancer   INTERVAL HISTORY:   Yolanda Friedman returns as scheduled. She has stable intermittent nausea/vomiting and abdominal pain. She takes hydrocodone as needed. She has occasional mild constipation. She has a good appetite. No significant shortness of breath.  Objective:  Vital signs in last 24 hours:  Blood pressure 120/64, pulse 75, temperature 98.2 F (36.8 C), temperature source Oral, resp. rate 18, height 5' (1.524 m), weight 180 lb 14.4 oz (82.056 kg).    HEENT: no thrush or ulcers. Resp: lungs clear bilaterally. Cardio: regular rate and rhythm. GI: abdomen soft and nontender. No hepatomegaly. No mass. Vascular:  No leg edema. Calves soft and nontender. Port-A-Cath without erythema  Lab Results:  Lab Results  Component Value Date   WBC 5.5 08/13/2014   HGB 11.5* 08/13/2014   HCT 35.5 08/13/2014   MCV 89.9 08/13/2014   PLT 205 08/13/2014   NEUTROABS 3.1 08/13/2014    Imaging:  No results found.  Medications: I have reviewed the patient's current medications.  Assessment/Plan: 1. Stage IV colon cancer, K-ras mutation detected  Initial diagnosis 2005  Recurrent disease April 2012, status post a hysterectomy, bilateral salpingo-oophorectomy, omentectomy, and radical debulking by Dr. 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 Diabetes  3. Hypertension  4. iron deficiency anemia-maintained on iron  5. CVA October 2014   Disposition:  Yolanda Friedman appears stable. She continues to have minimal symptoms related to the metastatic cancer and would like to continue to be followed on an observation/comfort care approach. She no longer wants to have the CEA checked. She will return for a followup visit and Port-A-Cath flush in 6 weeks. She will contact the office in the interim with any problems.    Ned Card ANP/GNP-BC   08/13/2014  12:18 PM

## 2014-08-13 NOTE — Patient Instructions (Signed)

## 2014-08-19 ENCOUNTER — Telehealth: Payer: Self-pay | Admitting: *Deleted

## 2014-08-19 ENCOUNTER — Other Ambulatory Visit: Payer: Self-pay | Admitting: Nurse Practitioner

## 2014-08-19 DIAGNOSIS — R110 Nausea: Secondary | ICD-10-CM

## 2014-08-19 DIAGNOSIS — C189 Malignant neoplasm of colon, unspecified: Secondary | ICD-10-CM

## 2014-08-19 DIAGNOSIS — D509 Iron deficiency anemia, unspecified: Secondary | ICD-10-CM

## 2014-08-19 MED ORDER — FERROUS FUMARATE 325 (106 FE) MG PO TABS: 1.0000 | ORAL_TABLET | Freq: Two times a day (BID) | ORAL | Status: DC

## 2014-08-19 MED ORDER — POTASSIUM CHLORIDE ER 10 MEQ PO TBCR: 10.0000 meq | EXTENDED_RELEASE_TABLET | Freq: Every day | ORAL | Status: AC

## 2014-08-19 MED ORDER — ONDANSETRON HCL 8 MG PO TABS: 8.0000 mg | ORAL_TABLET | Freq: Two times a day (BID) | ORAL | Status: DC | PRN

## 2014-08-19 NOTE — Telephone Encounter (Signed)
Patient called back reporting she also needs refill on her K+ if she still needs to take it. Continue KCL for now per Ned Card, NP-will recheck Bmet at next visit. Left detailed message for patient.

## 2014-08-19 NOTE — Telephone Encounter (Signed)
Needs refills on her hemocyte and ondansetron. Wants to change to [facility] [address]	[place].

## 2014-09-24 ENCOUNTER — Other Ambulatory Visit: Payer: Self-pay | Admitting: *Deleted

## 2014-09-24 ENCOUNTER — Ambulatory Visit: Payer: No Typology Code available for payment source

## 2014-09-24 ENCOUNTER — Ambulatory Visit (HOSPITAL_BASED_OUTPATIENT_CLINIC_OR_DEPARTMENT_OTHER): Payer: Medicare Other | Admitting: Oncology

## 2014-09-24 ENCOUNTER — Telehealth: Payer: Self-pay | Admitting: Oncology

## 2014-09-24 VITALS — BP 169/74 | HR 63 | Temp 97.3°F | Resp 20 | Ht 60.0 in | Wt 180.1 lb

## 2014-09-24 DIAGNOSIS — Z95828 Presence of other vascular implants and grafts: Secondary | ICD-10-CM

## 2014-09-24 DIAGNOSIS — C562 Malignant neoplasm of left ovary: Secondary | ICD-10-CM

## 2014-09-24 DIAGNOSIS — C189 Malignant neoplasm of colon, unspecified: Secondary | ICD-10-CM | POA: Diagnosis not present

## 2014-09-24 DIAGNOSIS — C561 Malignant neoplasm of right ovary: Secondary | ICD-10-CM | POA: Diagnosis not present

## 2014-09-24 MED ORDER — HEPARIN SOD (PORK) LOCK FLUSH 100 UNIT/ML IV SOLN
500.0000 [IU] | Freq: Once | INTRAVENOUS | Status: AC
  Administered 2014-09-24: 500 [IU] via INTRAVENOUS
  Filled 2014-09-24: qty 5

## 2014-09-24 MED ORDER — SODIUM CHLORIDE 0.9 % IJ SOLN
10.0000 mL | INTRAMUSCULAR | Status: DC | PRN
  Administered 2014-09-24: 10 mL via INTRAVENOUS
  Filled 2014-09-24: qty 10

## 2014-09-24 MED ORDER — HYDROCODONE-ACETAMINOPHEN 10-325 MG PO TABS: 1.0000 | ORAL_TABLET | Freq: Four times a day (QID) | ORAL | Status: DC | PRN

## 2014-09-24 NOTE — Telephone Encounter (Signed)
Gave avs & calendar for March. °

## 2014-09-24 NOTE — Patient Instructions (Signed)

## 2014-09-24 NOTE — Progress Notes (Signed)
  [place] OFFICE PROGRESS NOTE   Diagnosis: Colon cancer  INTERVAL HISTORY:   Mr. Zender returns as scheduled. She complains of upper airway congestion after returning from a trip. She has noted constipation since starting iron. She continues to have intermittent abdominal pain for which she takes hydrocodone. She has hot flashes.  Objective:  Vital signs in last 24 hours:  Blood pressure 169/74, pulse 63, temperature 97.3 F (36.3 C), temperature source Oral, resp. rate 20, height 5' (1.524 m), weight 180 lb 1.6 oz (81.693 kg), SpO2 100 %.    HEENT: Neck without mass Lymphatics: No cervical or supra-clavicular nodes Resp: Lungs clear bilaterally Cardio: Regular rate and rhythm GI: No hepatomegaly, firm fullness at the mid upper abdomen superior to the emboli [place] left of midline, no other discrete mass Vascular: No leg edema  Portacath/PICC-without erythema  Lab Results:  Lab Results  Component Value Date   WBC 5.5 08/13/2014   HGB 11.5* 08/13/2014   HCT 35.5 08/13/2014   MCV 89.9 08/13/2014   PLT 205 08/13/2014   NEUTROABS 3.1 08/13/2014      Lab Results  Component Value Date   CEA 52.4* 05/19/2014    Medications: I have reviewed the patient's current medications.  Assessment/Plan: 1. Stage IV colon cancer, K-ras mutation detected  Initial diagnosis 2005  Recurrent disease April 2012, status post a hysterectomy, bilateral salpingo-oophorectomy, omentectomy, and radical debulking by Dr. Fermin Schwab confirming metastatic colon cancer  Status post FOLFOX/Avastin followed by an attempted HIPEC surgery in December 2012, surgery aborted secondary to unresectable tumor invading the pancreas  Status post FOLFIRI/Avastin 09/20/2011 through 02/21/2012  Progressive disease in January 2014, status post repeat treatment with FOLFIRI/Avastin  Progressive disease April 2014  Regorafenib May 2014 through January 2015, treatment discontinued  secondary to patient preference  2. Diabetes  3. Hypertension  4. iron deficiency anemia-maintained on iron  5. CVA October 2014    Disposition:  Her overall status appears unchanged. 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 She does not wish to resume systemic therapy and she declines a restaging CT evaluation. She does not want to have the CEA checked.  I recommended she obtain a primary physician for management of hypertension and diabetes. She will try an over-the-counter decongestant for the nasal congestion.  She will return for an office visit and Port-A-Cath flush in 6 weeks.  Betsy Coder, MD  09/24/2014  1:05 PM

## 2014-09-30 ENCOUNTER — Telehealth: Payer: Self-pay | Admitting: *Deleted

## 2014-09-30 NOTE — Telephone Encounter (Signed)
Patient called [facility].  "I need Metformin, calcium, potassium, B/P medicine, Iron, Nasal Spray, and acid reflux medicine.  After reviewing chart noted [facility] and [facility]  Other refills are out of date or need to be ordered by PCP per 09-24-2014 office note.  Patient reports calling to set up a new PCP.  Call lost before being able to share all information. Called [phone].informing her of this information.  Asked if Dr. Benay Spice will refill the Exforge and Prilosec until she can get set up with a PCP.  Will notify Dr. Benay Spice of this request.

## 2014-10-02 ENCOUNTER — Other Ambulatory Visit: Payer: Self-pay | Admitting: *Deleted

## 2014-10-02 DIAGNOSIS — C189 Malignant neoplasm of colon, unspecified: Secondary | ICD-10-CM

## 2014-10-02 MED ORDER — AMLODIPINE BESYLATE-VALSARTAN 5-160 MG PO TABS: 1.0000 | ORAL_TABLET | Freq: Every day | ORAL | Status: DC

## 2014-10-02 MED ORDER — OMEPRAZOLE 20 MG PO CPDR: 20.0000 mg | DELAYED_RELEASE_CAPSULE | Freq: Two times a day (BID) | ORAL | Status: AC

## 2014-10-02 NOTE — Telephone Encounter (Signed)
OK to refill Exforge and Prilosec, per Dr. Benay Spice. Pt needs appt ac [facility] ASAP. POF entered. Pt notified.

## 2014-11-05 ENCOUNTER — Telehealth: Payer: Self-pay | Admitting: Oncology

## 2014-11-05 ENCOUNTER — Ambulatory Visit: Payer: No Typology Code available for payment source | Admitting: Oncology

## 2014-11-05 NOTE — Telephone Encounter (Signed)
Pt called to r/s flush/ov from 03/03 to 03/10 confirming flush/ov with NP/LT, pt will be heading out of town for 2 months and wanted to be seen and her port flushed before she heads out.... Yolanda Friedman

## 2014-11-09 ENCOUNTER — Ambulatory Visit: Payer: No Typology Code available for payment source | Admitting: Nurse Practitioner

## 2014-11-12 ENCOUNTER — Ambulatory Visit (HOSPITAL_BASED_OUTPATIENT_CLINIC_OR_DEPARTMENT_OTHER): Payer: Medicare Other

## 2014-11-12 ENCOUNTER — Telehealth: Payer: Self-pay | Admitting: Nurse Practitioner

## 2014-11-12 ENCOUNTER — Ambulatory Visit (HOSPITAL_BASED_OUTPATIENT_CLINIC_OR_DEPARTMENT_OTHER): Payer: Medicare Other | Admitting: Nurse Practitioner

## 2014-11-12 VITALS — BP 145/85 | HR 89 | Temp 98.5°F | Resp 18 | Ht 60.0 in | Wt 175.8 lb

## 2014-11-12 DIAGNOSIS — R110 Nausea: Secondary | ICD-10-CM

## 2014-11-12 DIAGNOSIS — E119 Type 2 diabetes mellitus without complications: Secondary | ICD-10-CM | POA: Diagnosis not present

## 2014-11-12 DIAGNOSIS — D509 Iron deficiency anemia, unspecified: Secondary | ICD-10-CM

## 2014-11-12 DIAGNOSIS — Z95828 Presence of other vascular implants and grafts: Secondary | ICD-10-CM

## 2014-11-12 DIAGNOSIS — Z452 Encounter for adjustment and management of vascular access device: Secondary | ICD-10-CM | POA: Diagnosis not present

## 2014-11-12 DIAGNOSIS — C189 Malignant neoplasm of colon, unspecified: Secondary | ICD-10-CM

## 2014-11-12 DIAGNOSIS — I10 Essential (primary) hypertension: Secondary | ICD-10-CM

## 2014-11-12 MED ORDER — HYDROCODONE-ACETAMINOPHEN 10-325 MG PO TABS: 1.0000 | ORAL_TABLET | Freq: Four times a day (QID) | ORAL | Status: DC | PRN

## 2014-11-12 MED ORDER — HEPARIN SOD (PORK) LOCK FLUSH 100 UNIT/ML IV SOLN
500.0000 [IU] | Freq: Once | INTRAVENOUS | Status: AC
  Administered 2014-11-12: 500 [IU] via INTRAVENOUS
  Filled 2014-11-12: qty 5

## 2014-11-12 MED ORDER — ONDANSETRON HCL 8 MG PO TABS: 8.0000 mg | ORAL_TABLET | Freq: Two times a day (BID) | ORAL | Status: DC | PRN

## 2014-11-12 MED ORDER — SODIUM CHLORIDE 0.9 % IJ SOLN
10.0000 mL | INTRAMUSCULAR | Status: DC | PRN
  Administered 2014-11-12: 10 mL via INTRAVENOUS
  Filled 2014-11-12: qty 10

## 2014-11-12 MED ORDER — TEMAZEPAM 15 MG PO CAPS: ORAL_CAPSULE | ORAL | Status: DC

## 2014-11-12 NOTE — Progress Notes (Signed)
  [place] OFFICE PROGRESS NOTE   Diagnosis:  Colon cancer  INTERVAL HISTORY:   Ms. Gilleland returns as scheduled. The abdominal pain she was experiencing at the time of her last visit is better. She continues hydrocodone as needed. Some days she requires no hydrocodone. At the most she reports taking 2 or 3 a day. Bowels moving regularly. Appetite is described as "not bad". She has intermittent nausea. No vomiting.  Objective:  Vital signs in last 24 hours:  Blood pressure 145/85, pulse 89, temperature 98.5 F (36.9 C), temperature source Oral, resp. rate 18, height 5' (1.524 m), weight 175 lb 12.8 oz (79.742 kg), SpO2 100 %.    HEENT: No thrush or ulcers. Lymphatics: No palpable cervical or supra-clavicular lymph nodes. Resp: Lungs clear bilaterally. Cardio: Regular rate and rhythm. GI: Abdomen soft. No hepatomegaly. Firm masslike fullness at the right mid abdomen. Vascular: No leg edema. Port-A-Cath without erythema.  Lab Results:  Lab Results  Component Value Date   WBC 5.5 08/13/2014   HGB 11.5* 08/13/2014   HCT 35.5 08/13/2014   MCV 89.9 08/13/2014   PLT 205 08/13/2014   NEUTROABS 3.1 08/13/2014    Imaging:  No results found.  Medications: I have reviewed the patient's current medications.  Assessment/Plan: 1. Stage IV colon cancer, K-ras mutation detected  Initial diagnosis 2005  Recurrent disease April 2012, status post a hysterectomy, bilateral salpingo-oophorectomy, omentectomy, and radical debulking by Dr. 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 Diabetes  3. Hypertension  4. Iron deficiency anemia-maintained on iron  5. CVA October 2014   Disposition: Ms. Flippin appears unchanged. Abdominal pain is well controlled with hydrocodone. She again stated that she does not wish to consider systemic therapy and does not want to have any CT scans or lab work. She prefers to continue observation. She is leaving to go out of town and will be returning in approximately 7 weeks. We scheduled a follow-up visit and port flush in 8 weeks. She was given new prescriptions for hydrocodone, temazepam and Zofran. She understands to contact the office prior to her next visit with any problems.  Plan reviewed with Dr. Benay Spice.    Ned Card ANP/GNP-BC   11/12/2014  4:11 PM

## 2014-11-12 NOTE — Patient Instructions (Signed)

## 2014-11-12 NOTE — Telephone Encounter (Signed)
Gave avs & calendar for May. °

## 2015-01-07 ENCOUNTER — Telehealth: Payer: Self-pay | Admitting: Oncology

## 2015-01-07 ENCOUNTER — Ambulatory Visit (HOSPITAL_BASED_OUTPATIENT_CLINIC_OR_DEPARTMENT_OTHER): Payer: Medicare Other

## 2015-01-07 ENCOUNTER — Telehealth: Payer: Self-pay | Admitting: *Deleted

## 2015-01-07 ENCOUNTER — Other Ambulatory Visit: Payer: Medicare Other

## 2015-01-07 ENCOUNTER — Ambulatory Visit (HOSPITAL_BASED_OUTPATIENT_CLINIC_OR_DEPARTMENT_OTHER): Payer: Medicare Other | Admitting: Oncology

## 2015-01-07 ENCOUNTER — Other Ambulatory Visit (HOSPITAL_BASED_OUTPATIENT_CLINIC_OR_DEPARTMENT_OTHER): Payer: Medicare Other

## 2015-01-07 VITALS — BP 158/100 | HR 120 | Temp 98.0°F | Resp 20 | Ht 60.0 in | Wt 160.8 lb

## 2015-01-07 DIAGNOSIS — E119 Type 2 diabetes mellitus without complications: Secondary | ICD-10-CM

## 2015-01-07 DIAGNOSIS — Z95828 Presence of other vascular implants and grafts: Secondary | ICD-10-CM

## 2015-01-07 DIAGNOSIS — C7989 Secondary malignant neoplasm of other specified sites: Secondary | ICD-10-CM | POA: Diagnosis not present

## 2015-01-07 DIAGNOSIS — C189 Malignant neoplasm of colon, unspecified: Secondary | ICD-10-CM | POA: Diagnosis not present

## 2015-01-07 DIAGNOSIS — Z452 Encounter for adjustment and management of vascular access device: Secondary | ICD-10-CM

## 2015-01-07 DIAGNOSIS — D509 Iron deficiency anemia, unspecified: Secondary | ICD-10-CM | POA: Diagnosis not present

## 2015-01-07 DIAGNOSIS — M545 Low back pain: Secondary | ICD-10-CM

## 2015-01-07 LAB — URINALYSIS, MICROSCOPIC - CHCC
Bilirubin (Urine): NEGATIVE
Glucose: NEGATIVE mg/dL
Ketones: 80 mg/dL
Leukocyte Esterase: NEGATIVE
Nitrite: NEGATIVE
PROTEIN: 100 mg/dL
SPECIFIC GRAVITY, URINE: 1.02 (ref 1.003–1.035)
Urobilinogen, UR: 0.2 mg/dL (ref 0.2–1)
pH: 6 (ref 4.6–8.0)

## 2015-01-07 MED ORDER — OXYCODONE HCL 10 MG PO TABS: 10.0000 mg | ORAL_TABLET | ORAL | Status: DC | PRN

## 2015-01-07 MED ORDER — SODIUM CHLORIDE 0.9 % IJ SOLN
10.0000 mL | INTRAMUSCULAR | Status: DC | PRN
  Administered 2015-01-07: 10 mL via INTRAVENOUS
  Filled 2015-01-07: qty 10

## 2015-01-07 MED ORDER — HEPARIN SOD (PORK) LOCK FLUSH 100 UNIT/ML IV SOLN
500.0000 [IU] | Freq: Once | INTRAVENOUS | Status: AC
  Administered 2015-01-07: 500 [IU] via INTRAVENOUS
  Filled 2015-01-07: qty 5

## 2015-01-07 MED ORDER — CIPROFLOXACIN HCL 500 MG PO TABS: 500.0000 mg | ORAL_TABLET | Freq: Two times a day (BID) | ORAL | Status: DC

## 2015-01-07 NOTE — Progress Notes (Signed)
  [place] OFFICE PROGRESS NOTE   Diagnosis: Colon cancer  INTERVAL HISTORY:   Mr. Joya Gaskins returns as scheduled. She complains of low back pain for the past one week. She has intermittent nausea. No burning with urination or discolored urine.. No difficulty with bowel function. She has intermittent "cramping "pain in the upper abdomen. The pain feels similar to when she had a "kidney "infection in high school.   Objective:  Vital signs in last 24 hours:  Blood pressure 158/100, pulse 120, temperature 98 F (36.7 C), temperature source Oral, resp. rate 20, height 5' (1.524 m), weight 160 lb 12.8 oz (72.938 kg), SpO2 100 %.    Resp: Lungs clear bilaterally Cardio: Regular rate and rhythm, tachycardia GI: Mild tenderness in the left subcostal region, no mass, no hepatosplenomegaly, the abdomen is soft Vascular: No leg edema Neuro: The leg and foot strength is intact bilaterally  Musculoskeletal: The pain localizes to the lumbar area. No tenderness. No mass.    Medications: I have reviewed the patient's current medications.  Assessment/Plan: 1. Stage IV colon cancer, K-ras mutation detected  Initial diagnosis 2005  Recurrent disease April 2012, status post a hysterectomy, bilateral salpingo-oophorectomy, omentectomy, and radical debulking by Dr. 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 Diabetes  3. Hypertension  4. Iron deficiency anemia-maintained on iron  5. CVA October 2014  6.    Back pain-likely  related to metastatic colon cancer involving the retroperitoneum or potentially the spine.  Disposition:  Mr. Futrell has metastatic colon cancer. She is maintained off of specific therapy. She presents today with new onset low back pain. I suspect the pain is related to the metastatic colon cancer. We will obtain a urinalysis to look for evidence of a urinary tract infection. She declines imaging studies. We gave her a prescription for oxycodone to use as needed for pain. She knows to contact us for neurologic symptoms. She will return for an office visit on 01/12/2015.  We can consider palliative radiation if she is documented to have progressive disease in the retroperitoneum or spine.  Betsy Coder, MD  01/07/2015  3:16 PM

## 2015-01-07 NOTE — Telephone Encounter (Signed)
Gave and printed appt sched and avs fo rpt for May °

## 2015-01-07 NOTE — Patient Instructions (Signed)

## 2015-01-07 NOTE — Telephone Encounter (Signed)
Informed pt of urinalysis results. Dr. Benay Spice does not think this is causing pain. MD recommends CT scan to evaluate pain, may be able to radiate an area for palliation. Pt requests to try antibiotic first. Order received for 3 day course of Cipro to see if this helps the pain. [facility]

## 2015-01-12 ENCOUNTER — Ambulatory Visit: Payer: Medicare Other | Admitting: Nurse Practitioner

## 2015-01-18 ENCOUNTER — Telehealth: Payer: Self-pay | Admitting: *Deleted

## 2015-01-18 NOTE — Telephone Encounter (Signed)
Returned call to pt, recommended CT to evaluate pain. She declined. Requests stronger pain med. Instructed pt to begin Miralax daily.  Reviewed with Dr. Benay Spice: OK to take up to 2 Oxycodone Q 4 hours PRN. Left message on voicemail informing of increased Oxycodone dose. Requested she return call with update in the morning.

## 2015-01-18 NOTE — Telephone Encounter (Signed)
Needs CT to evaluate etiology of pain May be a candidate for palliative radiation We can see her in symptom management clinic this week if she will come in

## 2015-01-18 NOTE — Telephone Encounter (Signed)
1)THE OXYCODONE HELPED PT.'S PAIN FOR FOUR TO FIVE DAYS. HOWEVER WHEN PT.'S PAIN IS AT A FOUR SHE WILL TAKE THE OXYCODONE AND HER PAIN WILL DECREASE TO A TWO BUT IT DOES NOT STAY AT A TWO FOR A TOTAL OF FOUR HOURS. 2) PT. HAS NOT HAD A BOWEL MOVEMENT IN THREE DAYS. SHE HAS OCCASIONAL BOWEL SOUNDS AND IS PASSING SOME GAS. PT. IS TAKING ONE STOOL SOFTER A DAY. ANY OTHER SUGGESTIONS?

## 2015-01-19 ENCOUNTER — Telehealth: Payer: Self-pay | Admitting: *Deleted

## 2015-01-19 ENCOUNTER — Telehealth: Payer: Self-pay | Admitting: Oncology

## 2015-01-19 NOTE — Telephone Encounter (Signed)
Called pt with appointment to see Dr. Benay Spice today, she declined. Stated she will not have a ride until Fri. She doesn't drive. Pt reports pain was not as bad last night. Controlled with 10 mg Oxycodone. Did not have to take 2 tabs. She has started Miralax. Instructed her to call office if Miralax is not effective.  Instructed pt to call office for increased pain, numbness or difficulty with ambulation or bowel/bladder function. She voiced understanding.

## 2015-01-19 NOTE — Telephone Encounter (Signed)
Spoke with patient and she is aware of her 5/20 appointment

## 2015-01-22 ENCOUNTER — Ambulatory Visit (HOSPITAL_BASED_OUTPATIENT_CLINIC_OR_DEPARTMENT_OTHER): Payer: Medicare Other | Admitting: Nurse Practitioner

## 2015-01-22 VITALS — BP 131/65 | HR 85 | Temp 98.0°F | Resp 20 | Wt 163.3 lb

## 2015-01-22 DIAGNOSIS — C189 Malignant neoplasm of colon, unspecified: Secondary | ICD-10-CM | POA: Diagnosis not present

## 2015-01-22 DIAGNOSIS — C7989 Secondary malignant neoplasm of other specified sites: Secondary | ICD-10-CM | POA: Diagnosis not present

## 2015-01-22 DIAGNOSIS — G893 Neoplasm related pain (acute) (chronic): Secondary | ICD-10-CM | POA: Diagnosis not present

## 2015-01-22 MED ORDER — OXYCODONE HCL 10 MG PO TABS: 10.0000 mg | ORAL_TABLET | ORAL | Status: DC | PRN

## 2015-01-22 MED ORDER — MORPHINE SULFATE ER 30 MG PO TBCR: 30.0000 mg | EXTENDED_RELEASE_TABLET | Freq: Two times a day (BID) | ORAL | Status: DC

## 2015-01-23 ENCOUNTER — Encounter: Payer: Self-pay | Admitting: Nurse Practitioner

## 2015-01-23 DIAGNOSIS — G893 Neoplasm related pain (acute) (chronic): Secondary | ICD-10-CM | POA: Insufficient documentation

## 2015-01-23 NOTE — Assessment & Plan Note (Signed)
Pt last received treatment with Regorafenib in January 2015.  Treatment was discontinued per patient request. She has been undergoing observation only since that time.   She is scheduled for a follow up visit with Dr. Benay Spice on 02/15/15.

## 2015-01-23 NOTE — Progress Notes (Signed)
SYMPTOM MANAGEMENT CLINIC   HPI: Yolanda Friedman 64 y.o. female diagnosed with colon cancer; with metastasis.  Pt is s/p Regorafenib last received in January 2015.    Pt is c/o progressive pain to her entire lumbar region.  She denies any radiation of the pain.  She also denies any numbness/weakness; or other neuro issues.  She denies any bladder or stool incontinence.   She was prescribed vicodin in the past; but it has been ineffective in managing her pain.    She was recently switched to oxycodone 10 mg- but this was ineffective as well.   HPI  ROS  Past Medical History  Diagnosis Date  . Hypertension   . Cancer   . Colon cancer 2005    metastatic 2012  . 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 Diabetes mellitus   . HTN (hypertension) 12/22/2011  . Shortness of breath   . GERD (gastroesophageal reflux disease)   . Anxiety   . Normocytic normochromic anemia 06/26/2013    Past Surgical History  Procedure Laterality Date  . Cholecystectomy  1987  . Colectomy  2005    colon ca  . Abdominal hysterectomy  12/20/2010    met colon ca  . Portacath placement  01/2011  . Cataract extraction w/phaco  08/08/2012    Procedure: CATARACT EXTRACTION PHACO AND INTRAOCULAR LENS PLACEMENT (IOC);  Surgeon: Tonny Branch, MD;  Location: [facility];  Service: Ophthalmology;  Laterality: Right;  CDE:16.18  . Cataract extraction w/phaco  08/26/2012    Procedure: CATARACT EXTRACTION PHACO AND INTRAOCULAR LENS PLACEMENT (IOC);  Surgeon: Tonny Branch, MD;  Location: [facility];  Service: Ophthalmology;  Laterality: Left;  CDE 7.64    has Metastatic recurrent adenocarcinoma of colon; HTN (hypertension); CVA (cerebral infarction); Normocytic normochromic anemia; Diabetes mellitus; and Cancer associated pain on her problem list.    has No Known Allergies.    Medication List       This list is accurate as of: 01/22/15 11:59 PM.  Always use your most recent med list.               albuterol 108 (90 BASE) MCG/ACT inhaler  Commonly known as:  PROVENTIL HFA;VENTOLIN HFA  Inhale 2 puffs into the lungs every 6 (six) hours as needed for wheezing.     amLODipine-valsartan 5-160 MG per tablet  Commonly known as:  EXFORGE  Take 1 tablet by mouth daily.     aspirin 325 MG tablet  Take 81 mg by mouth daily.     ciprofloxacin 500 MG tablet  Commonly known as:  CIPRO  Take 1 tablet (500 mg total) by mouth 2 (two) times daily.     ferrous fumarate 325 (106 FE) MG Tabs tablet  Commonly known as:  HEMOCYTE  Take 1 tablet (106 mg of iron total) by mouth 2 (two) times daily.     fluticasone 50 MCG/ACT nasal spray  Commonly known as:  FLONASE     metFORMIN 500 MG tablet  Commonly known as:  GLUCOPHAGE  Take 1 tablet (500 mg total) by mouth daily with breakfast.     morphine 30 MG 12 hr tablet  Commonly known as:  MS CONTIN  Take 1 tablet (30 mg total) by mouth every 12 (twelve) hours.     omeprazole 20 MG capsule  Commonly known as:  PRILOSEC  Take 1 capsule (20 mg total) by mouth 2 (two) times daily.     ondansetron 8 MG tablet  Commonly  known as:  ZOFRAN  Take 1 tablet (8 mg total) by mouth 2 (two) times daily as needed for nausea (vomiting).     ONE-A-DAY 50 PLUS PO  Take 1 tablet by mouth daily.     Oxycodone HCl 10 MG Tabs  Take 1 tablet (10 mg total) by mouth every 4 (four) hours as needed.     potassium chloride 10 MEQ tablet  Commonly known as:  K-DUR  Take 1 tablet (10 mEq total) by mouth daily.     temazepam 15 MG capsule  Commonly known as:  RESTORIL  Take 1-2 capsules by mouth @ bedtime as needed to help you sleep.         PHYSICAL EXAMINATION  Oncology Vitals 01/22/2015 01/07/2015 01/07/2015 11/12/2014 09/24/2014 08/13/2014 08/13/2014  Height - - 152 cm 152 cm 152 cm 152 cm 152 cm  Weight 74.072 kg - 72.938 kg 79.742 kg 81.693 kg 82.056 kg 82.056 kg  Weight (lbs) 163 lbs 5 oz - 160 lbs 13 oz 175 lbs 13 oz 180 lbs 2 oz 180 lbs 14 oz 180 lbs 14 oz    BMI (kg/m2) - - 31.4 kg/m2 34.33 kg/m2 35.17 kg/m2 35.33 kg/m2 35.33 kg/m2  Temp 98 - 98 98.5 97.3 98.2 98.2  Pulse 85 120 120 89 63 75 75  Resp 20 - 20 18 20 18 18   Resp (Historical as of 04/04/12) - - - - - - -  SpO2 100 - 100 100 100 - -  BSA (m2) - - 1.76 m2 1.84 m2 1.86 m2 1.86 m2 1.86 m2   BP Readings from Last 3 Encounters:  01/22/15 131/65  01/07/15 158/100  11/12/14 145/85    Physical Exam  Constitutional: She is oriented to person, place, and time and well-developed, well-nourished, and in no distress.  HENT:  Head: Normocephalic and atraumatic.  Eyes: Conjunctivae and EOM are normal. Pupils are equal, round, and reactive to light. Right eye exhibits no discharge. Left eye exhibits no discharge. No scleral icterus.  Neck: Normal range of motion.  Pulmonary/Chest: Effort normal. No respiratory distress.  Musculoskeletal: Normal range of motion. She exhibits no edema or tenderness.  Neurological: She is alert and oriented to person, place, and time. Gait normal.  Skin: Skin is warm and dry.  Psychiatric: Affect normal.  Nursing note and vitals reviewed.   LABORATORY DATA:. No visits with results within 3 Day(s) from this visit. Latest known visit with results is:  Appointment on 01/07/2015  Component Date Value Ref Range Status  . Glucose 01/07/2015 Negative  Negative mg/dL Final  . Bilirubin (Urine) 01/07/2015 Negative  Negative Final  . Ketones 01/07/2015 80  Negative mg/dL Final  . Specific Gravity, Urine 01/07/2015 1.020  1.003 - 1.035 Final  . Blood 01/07/2015 Trace  Negative Final  . pH 01/07/2015 6.0  4.6 - 8.0 Final  . Protein 01/07/2015 100  Negative- <30 mg/dL Final  . Urobilinogen, UR 01/07/2015 0.2  0.2 - 1 mg/dL Final  . Nitrite 01/07/2015 Negative  Negative Final  . Leukocyte Esterase 01/07/2015 Negative  Negative Final  . RBC / HPF 01/07/2015 3-6  0 - 2 Final  . WBC, UA 01/07/2015 3-6  0 - 2 Final  . Bacteria, UA 01/07/2015 Few  Negative- Trace  Final  . Casts 01/07/2015 Hyaline. granular   Negative Final  . Epithelial Cells 01/07/2015 Few  Negative- Few Final  . Mucus, UA 01/07/2015 Small  Negative- Small Final  . Amorphous, UA 01/07/2015 Small  Negative- Small Final     RADIOGRAPHIC STUDIES: No results found.  ASSESSMENT/PLAN:    Metastatic recurrent adenocarcinoma of colon Pt last received treatment with Regorafenib in January 2015.  Treatment was discontinued per patient request. She has been undergoing observation only since that time.   She is scheduled for a follow up visit with Dr. Benay Spice on 02/15/15.     Cancer associated pain Pt is c/o progressive pain to her entire lumbar region.  She denies any radiation of the pain.  She also denies any numbness/weakness; or other neuro issues.  She denies any bladder or stool incontinence.   She was prescribed vicodin in the past; but it has been ineffective in managing her pain.    She was recently switched to oxycodone 10 mg- but this was ineffective as well.   On exam- no specific tenderness to lumbar region with palpation.  Observed with full ROM;and ambulating well.   Dr. Benay Spice spoke with patient; and advised pt that she may very well need a restaging scan since progressive lumbar pain could very well be progressive disease.   Pt was advised that could consider palliative radiation if disease found in lumbar region.    Pt refused all further scans; and states that she "does not want to know anything else about her cancer; and has no interest in any further treatments".    Dr. Benay Spice then offered referral for Hospice- but pt stated that she wants to think about Hospice before she makes decision regarding Hospice care.   Pt was prescribed MS Contin 30 mg BID; and will continue to use the oxycodone 10 mg tablets for breakthru pain.        Patient stated understanding of all instructions; and was in agreement with this plan of care. The patient knows to call the  clinic with any problems, questions or concerns.   This was a shared visit with Dr. Benay Spice today.   Total time spent with patient was 25 minutes;  with greater than 75 percent of that time spent in face to face counseling regarding patient's symptoms,  and coordination of care and follow up.  Disclaimer: This note was dictated with voice recognition software. Similar sounding words can inadvertently be transcribed and may not be corrected upon review.   Drue Second, NP 01/23/2015   This was a shared visit with Drue Second. Ms.Montminy declined physical exam today. The pain is very likely due to progression of the colon cancer. She declined a Hospice referral.  She will return as scheduled and agrees to call if her pain is not controlled with the current regimen.  Julieanne Manson, MD

## 2015-01-23 NOTE — Assessment & Plan Note (Addendum)
Pt is c/o progressive pain to her entire lumbar region.  She denies any radiation of the pain.  She also denies any numbness/weakness; or other neuro issues.  She denies any bladder or stool incontinence.   She was prescribed vicodin in the past; but it has been ineffective in managing her pain.    She was recently switched to oxycodone 10 mg- but this was ineffective as well.   On exam- no specific tenderness to lumbar region with palpation.  Observed with full ROM;and ambulating well.   Dr. Benay Spice spoke with patient; and advised pt that she may very well need a restaging scan since progressive lumbar pain could very well be progressive disease.   Pt was advised that could consider palliative radiation if disease found in lumbar region.    Pt refused all further scans; and states that she "does not want to know anything else about her cancer; and has no interest in any further treatments".    Dr. Benay Spice then offered referral for Hospice- but pt stated that she wants to think about Hospice before she makes decision regarding Hospice care.   Pt was prescribed MS Contin 30 mg BID; and will continue to use the oxycodone 10 mg tablets for breakthru pain.

## 2015-02-15 ENCOUNTER — Ambulatory Visit (HOSPITAL_BASED_OUTPATIENT_CLINIC_OR_DEPARTMENT_OTHER): Payer: Medicare Other

## 2015-02-15 ENCOUNTER — Ambulatory Visit (HOSPITAL_BASED_OUTPATIENT_CLINIC_OR_DEPARTMENT_OTHER): Payer: Medicare Other | Admitting: Oncology

## 2015-02-15 VITALS — BP 137/76 | HR 82 | Temp 98.3°F | Resp 18 | Ht 60.0 in | Wt 159.6 lb

## 2015-02-15 DIAGNOSIS — D509 Iron deficiency anemia, unspecified: Secondary | ICD-10-CM

## 2015-02-15 DIAGNOSIS — C7989 Secondary malignant neoplasm of other specified sites: Secondary | ICD-10-CM | POA: Diagnosis not present

## 2015-02-15 DIAGNOSIS — C189 Malignant neoplasm of colon, unspecified: Secondary | ICD-10-CM | POA: Diagnosis not present

## 2015-02-15 DIAGNOSIS — E119 Type 2 diabetes mellitus without complications: Secondary | ICD-10-CM | POA: Diagnosis not present

## 2015-02-15 DIAGNOSIS — K5909 Other constipation: Secondary | ICD-10-CM

## 2015-02-15 DIAGNOSIS — R110 Nausea: Secondary | ICD-10-CM

## 2015-02-15 DIAGNOSIS — Z95828 Presence of other vascular implants and grafts: Secondary | ICD-10-CM

## 2015-02-15 DIAGNOSIS — Z452 Encounter for adjustment and management of vascular access device: Secondary | ICD-10-CM

## 2015-02-15 MED ORDER — SODIUM CHLORIDE 0.9 % IJ SOLN
10.0000 mL | INTRAMUSCULAR | Status: DC | PRN
  Administered 2015-02-15: 10 mL via INTRAVENOUS
  Filled 2015-02-15: qty 10

## 2015-02-15 MED ORDER — ONDANSETRON HCL 8 MG PO TABS: 8.0000 mg | ORAL_TABLET | Freq: Two times a day (BID) | ORAL | Status: DC | PRN

## 2015-02-15 MED ORDER — HEPARIN SOD (PORK) LOCK FLUSH 100 UNIT/ML IV SOLN
500.0000 [IU] | Freq: Once | INTRAVENOUS | Status: AC
  Administered 2015-02-15: 500 [IU] via INTRAVENOUS
  Filled 2015-02-15: qty 5

## 2015-02-15 MED ORDER — MORPHINE SULFATE ER 30 MG PO TBCR: 30.0000 mg | EXTENDED_RELEASE_TABLET | Freq: Two times a day (BID) | ORAL | Status: DC

## 2015-02-15 NOTE — Patient Instructions (Signed)

## 2015-02-15 NOTE — Progress Notes (Signed)
  Yolanda Friedman OFFICE PROGRESS NOTE   Diagnosis: Colon cancer  INTERVAL HISTORY:   Yolanda Friedman returns as scheduled. She reports the abdomen/back pain much improved since starting MS Contin. She has decreased the amount of oxycodone for breakthrough pain. She remains constipated. No other complaint.  Objective:  Vital signs in last 24 hours:  Blood pressure 137/76, pulse 82, temperature 98.3 F (36.8 C), temperature source Oral, resp. rate 18, height 5' (1.524 m), weight 159 lb 9.6 oz (72.394 kg), SpO2 98 %.    HEENT: Neck without mass Lymphatics: No cervical or supra-clavicular nodes Resp: Lungs clear bilaterally Cardio: Regular rate and rhythm GI: No hepatosplenomegaly, no apparent ascites, masslike fullness in the right upper abdomen and mid upper abdomen Vascular: No leg edema  Portacath/PICC-without erythema  Lab Results:  Lab Results  Component Value Date   WBC 5.5 08/13/2014   HGB 11.5* 08/13/2014   HCT 35.5 08/13/2014   MCV 89.9 08/13/2014   PLT 205 08/13/2014   NEUTROABS 3.1 08/13/2014    Lab Results  Component Value Date   NA 143 05/19/2014    Lab Results  Component Value Date   CEA 52.4* 05/19/2014    Imaging:  No results found.  Medications: I have reviewed the patient's current medications.  Assessment/Plan: 1. Stage IV colon cancer, K-ras mutation detected  Initial diagnosis 2005  Recurrent disease April 2012, status post a hysterectomy, bilateral salpingo-oophorectomy, omentectomy, and radical debulking by Dr. 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 Diabetes  3. Hypertension  4. Iron deficiency anemia-maintained on iron  5. CVA October 2014  6. Back pain-likely related to metastatic colon cancer involving the retroperitoneum or potentially the spine. Improved with MS Contin  7.    Constipation secondary to narcotics and iron    Disposition:  Yolanda Friedman has noted significant improvement in pain since starting MS Contin. She will continue the current narcotic pain regimen. She will increase the stool softener to 2 daily and continue MiraLAX 1-2 times per day. She will let us know if her constipation is not improved with this regimen.  She again stated that she does not wish to undergo further CT scans or consider chemotherapy or radiation.  She will return for an office visit and Port-A-Cath flush in 6 weeks.  Betsy Coder, MD  02/15/2015  3:57 PM

## 2015-03-10 ENCOUNTER — Emergency Department: Payer: No Typology Code available for payment source

## 2015-03-10 ENCOUNTER — Emergency Department
Admission: EM | Admit: 2015-03-10 | Discharge: 2015-03-10 | Discharge status: Against Medical Advice | Payer: No Typology Code available for payment source | Attending: Emergency Medicine | Admitting: Emergency Medicine

## 2015-03-10 DIAGNOSIS — E785 Hyperlipidemia, unspecified: Secondary | ICD-10-CM | POA: Insufficient documentation

## 2015-03-10 DIAGNOSIS — R739 Hyperglycemia, unspecified: Secondary | ICD-10-CM

## 2015-03-10 DIAGNOSIS — Z9221 Personal history of antineoplastic chemotherapy: Secondary | ICD-10-CM | POA: Insufficient documentation

## 2015-03-10 DIAGNOSIS — Z7982 Long term (current) use of aspirin: Secondary | ICD-10-CM | POA: Insufficient documentation

## 2015-03-10 DIAGNOSIS — E1165 Type 2 diabetes mellitus with hyperglycemia: Secondary | ICD-10-CM | POA: Insufficient documentation

## 2015-03-10 DIAGNOSIS — I10 Essential (primary) hypertension: Secondary | ICD-10-CM | POA: Insufficient documentation

## 2015-03-10 DIAGNOSIS — C189 Malignant neoplasm of colon, unspecified: Secondary | ICD-10-CM | POA: Insufficient documentation

## 2015-03-10 DIAGNOSIS — R112 Nausea with vomiting, unspecified: Secondary | ICD-10-CM | POA: Insufficient documentation

## 2015-03-10 LAB — COMPREHENSIVE METABOLIC PANEL
ALT: 18 U/L (ref 0–55)
AST (SGOT): 24 U/L (ref 5–34)
Albumin/Globulin Ratio: 1 (ref 0.9–2.2)
Albumin: 3.8 g/dL (ref 3.5–5.0)
Alkaline Phosphatase: 90 U/L (ref 37–106)
Anion Gap: 20 — ABNORMAL HIGH (ref 5.0–15.0)
BUN: 48 mg/dL — ABNORMAL HIGH (ref 7.0–19.0)
Bilirubin, Total: 0.6 mg/dL (ref 0.2–1.2)
CO2: 31 mEq/L — ABNORMAL HIGH (ref 22–29)
Calcium: 10.4 mg/dL (ref 8.5–10.5)
Chloride: 84 mEq/L — ABNORMAL LOW (ref 100–111)
Creatinine: 2 mg/dL — ABNORMAL HIGH (ref 0.6–1.0)
Globulin: 3.8 g/dL — ABNORMAL HIGH (ref 2.0–3.6)
Glucose: 318 mg/dL — ABNORMAL HIGH (ref 70–100)
Potassium: 3.3 mEq/L — ABNORMAL LOW (ref 3.5–5.1)
Protein, Total: 7.6 g/dL (ref 6.0–8.3)
Sodium: 135 mEq/L — ABNORMAL LOW (ref 136–145)

## 2015-03-10 LAB — CBC AND DIFFERENTIAL
Basophils Absolute Automated: 0.02 10*3/uL (ref 0.00–0.20)
Basophils Automated: 0 %
Eosinophils Absolute Automated: 0.01 10*3/uL (ref 0.00–0.70)
Eosinophils Automated: 0 %
Hematocrit: 39.7 % (ref 37.0–47.0)
Hgb: 13.2 g/dL (ref 12.0–16.0)
Lymphocytes Absolute Automated: 1.59 10*3/uL (ref 0.50–4.40)
Lymphocytes Automated: 18 %
MCH: 28.7 pg (ref 28.0–32.0)
MCHC: 33.2 g/dL (ref 32.0–36.0)
MCV: 86.3 fL (ref 80.0–100.0)
MPV: 11 fL (ref 9.4–12.3)
Monocytes Absolute Automated: 0.71 10*3/uL (ref 0.00–1.20)
Monocytes: 8 %
Neutrophils Absolute: 6.73 10*3/uL (ref 1.80–8.10)
Neutrophils: 74 %
Platelets: 303 10*3/uL (ref 140–400)
RBC: 4.6 10*6/uL (ref 4.20–5.40)
RDW: 12 % (ref 12–15)
WBC: 9.06 10*3/uL (ref 3.50–10.80)

## 2015-03-10 LAB — GFR: EGFR: 30.2

## 2015-03-10 LAB — TROPONIN I: Troponin I: 0.03 ng/mL (ref 0.00–0.09)

## 2015-03-10 MED ORDER — ONDANSETRON HCL 4 MG/2ML IJ SOLN
4.0000 mg | Freq: Once | INTRAMUSCULAR | Status: AC
Start: 2015-03-10 — End: 2015-03-10
  Administered 2015-03-10: 4 mg via INTRAVENOUS
  Filled 2015-03-10: qty 2

## 2015-03-10 MED ORDER — PROMETHAZINE HCL 25 MG/ML IJ SOLN
12.5000 mg | Freq: Once | INTRAMUSCULAR | Status: AC
Start: 2015-03-10 — End: 2015-03-10
  Administered 2015-03-10: 12.5 mg via INTRAVENOUS
  Filled 2015-03-10: qty 1

## 2015-03-10 MED ORDER — SODIUM CHLORIDE 0.9 % IV BOLUS
1000.0000 mL | Freq: Once | INTRAVENOUS | Status: AC
Start: 2015-03-10 — End: 2015-03-10
  Administered 2015-03-10: 1000 mL via INTRAVENOUS

## 2015-03-10 MED ORDER — SODIUM CHLORIDE 0.9 % IV BOLUS
500.0000 mL | Freq: Once | INTRAVENOUS | Status: AC
Start: 2015-03-10 — End: 2015-03-10
  Administered 2015-03-10: 500 mL via INTRAVENOUS

## 2015-03-10 MED ORDER — SODIUM CHLORIDE 0.9 % IV BOLUS
1000.0000 mL | Freq: Once | INTRAVENOUS | Status: DC
Start: 2015-03-10 — End: 2015-03-10

## 2015-03-10 MED ORDER — ONDANSETRON 4 MG PO TBDP
4.0000 mg | ORAL_TABLET | Freq: Three times a day (TID) | ORAL | Status: AC | PRN
Start: 2015-03-10 — End: ?

## 2015-03-10 MED ORDER — MORPHINE SULFATE 4 MG/ML IJ/IV SOLN (WRAP)
4.0000 mg | Freq: Once | Status: AC
Start: 2015-03-10 — End: 2015-03-10
  Administered 2015-03-10: 4 mg via INTRAVENOUS
  Filled 2015-03-10: qty 1

## 2015-03-10 MED ORDER — POTASSIUM CHLORIDE 10 MEQ/100ML IV SOLN
10.0000 meq | Freq: Once | INTRAVENOUS | Status: AC
Start: 2015-03-10 — End: 2015-03-10
  Administered 2015-03-10: 10 meq via INTRAVENOUS
  Filled 2015-03-10: qty 100

## 2015-03-10 MED ORDER — PROMETHAZINE HCL 25 MG RE SUPP
25.0000 mg | Freq: Four times a day (QID) | RECTAL | Status: AC | PRN
Start: 2015-03-10 — End: ?

## 2015-03-10 MED ORDER — POTASSIUM CHLORIDE CRYS ER 20 MEQ PO TBCR
40.0000 meq | EXTENDED_RELEASE_TABLET | Freq: Once | ORAL | Status: AC
Start: 2015-03-10 — End: 2015-03-10
  Administered 2015-03-10: 40 meq via ORAL
  Filled 2015-03-10: qty 2

## 2015-03-10 NOTE — ED Notes (Signed)
Pt c/o vomiting, feeling shaky, light headed on standing after stopping oral morphine

## 2015-03-10 NOTE — ED Provider Notes (Signed)
Physician/Midlevel provider first contact with patient: 03/10/15 8413         History     Chief Complaint   Patient presents with   . Chills   . Dizziness     HPI   Patient states she has metastatic colon cancer.  She stopped chemotherapy about a year ago.  It is making her sick all the time and she decided that it was not worse.  The decrease in quality of life.  She then 11 days ago started vomiting.  She states over the last day has gotten worse, however, of note, she also stopped her MS Contin and OxyContin 2 days ago because she did not want to take anymore .   She states that she does not want any x-rays, so and does not want any other cancer treatment, but would like to be rehydrated.    Pt answers that there are no fevers, chills, diarrhea, constipation, trauma or rash.      Nausea, vomiting  Constitutional:  Denies fever or chills   Eyes:  Denies change in visual acuity or eye pain  HENT:  Denies sore throat, trouble swallowing   Respiratory:  Denies cough or shortness of breath or wheeze   Cardiovascular:  Denies chest pain or edema   GI:  Denies abdominal pain, bloody stools or diarrhea  GU:  Denies dysuria, hematuria, frequency or trouble urinating   Musculoskeletal:  Denies other joint pain  Integument:  Denies rash  or wound  Neurologic:  Denies headache, focal weakness, numbness, tingling, speech, vision or gait changes  Psychiatric:  Denies suicidal or homicidal ideation.          Constitutional:  Well developed, well nourished, no acute distress, not ill appearing   Eyes:   conjunctiva normal, no discharge or redness  HENT:  Atraumatic, external ears normal, nose normal, oropharynx moist, no pharyngeal exudates. Neck- normal range of motion, no tenderness, supple   Respiratory:  No respiratory distress, normal breath sounds, no rales, no wheezing, no rhonchi  Cardiovascular:  Normal rate, normal rhythm, no murmurs, no gallops, no rubs, normal radial pulses and dpp   GI:  Soft, nondistended,  nontender, no organomegaly, no mass, no rebound, no guarding   GU:  No costovertebral angle tenderness   Musculoskeletal:  No edema, no tenderness, no deformities. Back- no tenderness  Integument:  Well hydrated, no rash   Lymphatic:  No prominent cervical LAD  Neurologic:  Alert & oriented x 3, normal motor function, no focal deficits noted, coordination normal   Psychiatric:  Speech and behavior appropriate       Diagnosis management comments: I, Ferdinand Cava, MD, have been the primary provider for this patient during this Emergency Dept visit.    Oxygen saturation by pulse oximetry is 95%-100%, Normal, none needed.    DDx:   Obstruction versus dehydration versus brain metastases versus hyponatremia versus's arrhythmia versus other    Rhythm:  Normal Sinus  Ectopy:  None  Rate:  Tachy   123  Conduction:  No blocks  ST Segments:  Normal ST segments  T Waves:  Nonspecific T Wave changes  Axis:  Normal  Q Waves:  None seen  Clinical Impression:  Nonspecific EKG      Discussed risks, benefits, alternatives with patient regarding possible obstruction versus ulcer or gastritis versus other and the benefits of CAT scan of her abdomen and pelvis to help rule out some of these things.  She again declines.  She understands that we cannot make a diagnosis and probably not treat her accurately her properly without these tests.          She decides to leave without any further testing despite these risks        Patient Progress  Patient progress: stable        Past Medical History   Diagnosis Date   . Cancer    . Hypertensive disorder    . Diabetes mellitus without complication    . Hyperlipidemia        Past Surgical History   Procedure Laterality Date   . Abdominal surgery     . Cholecystectomy     . Hysterectomy         History reviewed. No pertinent family history.    Social  History   Substance Use Topics   . Smoking status: Never Smoker    . Smokeless tobacco: Not on file   . Alcohol Use: No       .     No Known  Allergies    Home Medications                   amlodipine-valsartan (EXFORGE) 5-160 MG per tablet     Take 1 tablet by mouth daily.     aspirin 81 MG tablet     Take 81 mg by mouth daily.     metFORMIN (GLUCOPHAGE) 500 MG tablet     Take 500 mg by mouth 2 (two) times daily with meals.     MORPHINE SULFATE ER PO     Take 30 mg by mouth 2 (two) times daily.     omeprazole (PRILOSEC) 20 MG capsule     Take 20 mg by mouth daily.     OXYCODONE PO     Take 10 mg by mouth 4 (four) times daily.     prochlorperazine (COMPAZINE) 25 MG suppository     Place 25 mg rectally every 12 (twelve) hours as needed.                                                             Review of Systems    Physical Exam    BP: (!) 175/98 mmHg, Heart Rate: (!) 116, Temp: 98.2 F (36.8 C), Resp Rate: 18, SpO2: 100 %, Weight: 64.864 kg    Physical Exam      MDM and ED Course     ED Medication Orders     Start Ordered     Status Ordering Provider    03/10/15 1401 03/10/15 1400     Once,   Status:  Discontinued     Route: Intravenous  Ordered Dose: 1,000 mL     Discontinued Nobel Brar JAMES    03/10/15 1401 03/10/15 1400  morphine injection 4 mg   Once     Route: Intravenous  Ordered Dose: 4 mg     Last MAR action:  Given Hester Forget JAMES    03/10/15 1401 03/10/15 1400  promethazine (PHENERGAN) injection 12.5 mg   Once     Route: Intravenous  Ordered Dose: 12.5 mg     Last MAR action:  Given Ferdinand Cava    03/10/15 1226 03/10/15 1225  potassium chloride 10 mEq in 100  mL IVPB (premix)   Once     Route: Intravenous  Ordered Dose: 10 mEq     Last MAR action:  Stopped Philippa Vessey JAMES    03/10/15 1226 03/10/15 1225  ondansetron (ZOFRAN) injection 4 mg   Once     Route: Intravenous  Ordered Dose: 4 mg     Last MAR action:  Given Arnesha Schiraldi JAMES    03/10/15 1205 03/10/15 1204  potassium chloride (K-DUR,KLOR-CON) CR tablet 40 mEq   Once     Route: Oral  Ordered Dose: 40 mEq     Last MAR action:  Given Ferdinand Cava     03/10/15 1205 03/10/15 1204  sodium chloride 0.9 % bolus 1,000 mL   Once     Route: Intravenous  Ordered Dose: 1,000 mL     Last MAR action:  Stopped Ferdinand Cava    03/10/15 1610 03/10/15 0951  sodium chloride 0.9 % bolus 500 mL   Once     Route: Intravenous  Ordered Dose: 500 mL     Last MAR action:  Stopped Nannie Starzyk JAMES             MDM  Number of Diagnoses or Management Options  Hyperglycemia:   Nausea and vomiting, unspecified intactability, vomiting of unspecified type:      Amount and/or Complexity of Data Reviewed  Clinical lab tests: ordered and reviewed    Risk of Complications, Morbidity, and/or Mortality  Presenting problems: high  Diagnostic procedures: moderate  Management options: high    Patient Progress  Patient progress: improved            Procedures  Results     Procedure Component Value Units Date/Time    Troponin I [[id number]] Collected:  03/10/15 1123     Troponin I 0.03 ng/mL Updated:  03/10/15 1155    Comprehensive metabolic panel [[id number]]  (Abnormal) Collected:  03/10/15 1123     Glucose 318 (H) mg/dL Updated:  56/21/30 8657     BUN 48.0 (H) mg/dL      Creatinine 2.0 (H) mg/dL      Sodium 846 (L) mEq/L      Potassium 3.3 (L) mEq/L      Chloride 84 (L) mEq/L      CO2 31 (H) mEq/L      Calcium 10.4 mg/dL      Protein, Total 7.6 g/dL      Albumin 3.8 g/dL      AST (SGOT) 24 U/L      ALT 18 U/L      Alkaline Phosphatase 90 U/L      Bilirubin, Total 0.6 mg/dL      Globulin 3.8 (H) g/dL      Albumin/Globulin Ratio 1.0      Anion Gap 20.0 (H)     GFR [[id number]] Collected:  03/10/15 1123     EGFR 30.2 Updated:  03/10/15 1153    CBC with differential [[id number]] Collected:  03/10/15 1009    Specimen Information:  Blood from Blood Updated:  03/10/15 1020     WBC 9.06 x10 3/uL      Hgb 13.2 g/dL      Hematocrit 25.3 %      Platelets 303 x10 3/uL      RBC 4.60 x10 6/uL      MCV 86.3 fL      MCH 28.7 pg      MCHC 33.2 g/dL      RDW 12 %  MPV 11.0 fL      Neutrophils 74 %       Lymphocytes Automated 18 %      Monocytes 8 %      Eosinophils Automated 0 %      Basophils Automated 0 %      Immature Granulocyte Unmeasured %      Nucleated RBC Unmeasured /100 WBC      Neutrophils Absolute 6.73 x10 3/uL      Abs Lymph Automated 1.59 x10 3/uL      Abs Mono Automated 0.71 x10 3/uL      Abs Eos Automated 0.01 x10 3/uL      Absolute Baso Automated 0.02 x10 3/uL      Absolute Immature Granulocyte Unmeasured x10 3/uL           Clinical Impression & Disposition     Clinical Impression  Final diagnoses:   Nausea and vomiting, unspecified intactability, vomiting of unspecified type   Hyperglycemia        ED Disposition     Observation            Discharge Medication List as of 03/10/2015  2:09 PM      START taking these medications    Details   ondansetron (ZOFRAN ODT) 4 MG disintegrating tablet Take 1 tablet (4 mg total) by mouth every 8 (eight) hours as needed for Nausea., Starting 03/10/2015, Until Discontinued, Print      promethazine (PHENERGAN) 25 MG suppository Place 1 suppository (25 mg total) rectally every 6 (six) hours as needed for Nausea (as needed for severe vomiting)., Starting 03/10/2015, Until Discontinued, Print                         Ferdinand Cava, MD  03/11/15 2234

## 2015-03-10 NOTE — Discharge Instructions (Signed)
YOU MAY SEE THE DOCTOR LISTED IF YOUR DOCTOR IS NOT AVAILABLE FOR FOLLOW UP IN THE TIME FRAME PROVIDED.  RETURN TO THE EMERGENCY DEPARTMENT IMMEDIATELY FOR ANY WORSENING OR CONCERNS.      Vomiting    You have been seen for vomiting.    Vomiting (throwing-up) can be caused by many different things. Most of the time the cause IS NOT serious. The doctor feels it is OK for you to go home today.    Common causes of vomiting include the following:   Gastroenteritis (stomach flu), usually with diarrhea.   Other illnesses. Sometimes medical conditions like diabetes, heart problems, headaches, or infections can make someone throw up.    Bowel obstructions (blockages) can cause vomiting and make patients unable to have bowel movements (stool) or pass gas.   Vomiting can be a symptom of appendicitis, especially if there is also pain in the right lower abdomen (belly).    Sometimes it is hard to find out what is causing the vomiting. Vomiting can be treated with anti-nausea medicines like promethazine (Phenergan), prochlorperazine (Compazine) or ondansetron (Zofran).    Try to drink liquids to avoid dehydration. Don't drink a lot of fluid all at once. Take small sips throughout the day.    YOU SHOULD SEEK MEDICAL ATTENTION IMMEDIATELY, EITHER HERE OR AT THE NEAREST EMERGENCY DEPARTMENT, IF ANY OF THE FOLLOWING OCCURS:   You can't stop vomiting or your vomiting doesn't get better with medication.   You cannot keep liquids down.   You have severe sudden chest or belly pain after vomiting.   You have abdominal pain.       Diabetes Mellitus, Type II    You have been diagnosed with type II diabetes.    Diabetes is a disease. It is when the body has trouble regulating the amount of sugar in the blood stream.    Type II diabetes is sometimes called "adult-onset diabetes". It is usually found in adults older than 40. However, type II diabetes is also seen in children and young adults.    Diabetes is a very serious  disease. When not controlled, it can cause life-threatening problems. Untreated diabetes can lead to heart problems and kidney problems (including kidney failure). It can also lead to stroke and blindness.    Your insulin or pills will help keep your sugar under control. Also follow the diet prescribed by your doctor to keep your sugar under control. Untreated diabetes can cause heart problems, stroke or blindness. Therefore, it is very important for you to follow up with your regular doctor.    Check your blood sugar before every meal and before bedtime.    Insulin and other medicines for diabetes can make you feel lightheaded. You may sweat or even pass out. Always keep a snack food that contains sugar with you. If you begin to have these symptoms, eat the snack food to see if the symptoms get better. NEVER drive a car when feeling this way.    YOU SHOULD SEEK MEDICAL ATTENTION IMMEDIATELY, EITHER HERE OR AT THE NEAREST EMERGENCY DEPARTMENT, IF ANY OF THE FOLLOWING OCCURS:   Abdominal (belly) pain.   More than 3 vomiting (throwing up) episodes.   Blood sugar over 400 more than 3 times.   Fever (temperature higher than 100.91F / 38C) or shaking chills.   Unable to keep medicines down.   Any trouble swallowing or breathing.   Infection or rash on your skin.   Low blood sugar (less than  70) 3 or more times that does not get better after eating.

## 2015-03-10 NOTE — ED Notes (Signed)
Patient refused x ray

## 2015-03-11 LAB — ECG 12-LEAD
Atrial Rate: 123 {beats}/min
P Axis: 73 degrees
P-R Interval: 134 ms
Q-T Interval: 318 ms
QRS Duration: 72 ms
QTC Calculation (Bezet): 455 ms
R Axis: 48 degrees
T Axis: -2 degrees
Ventricular Rate: 123 {beats}/min

## 2015-03-29 ENCOUNTER — Ambulatory Visit (HOSPITAL_BASED_OUTPATIENT_CLINIC_OR_DEPARTMENT_OTHER): Payer: Medicare Other | Admitting: Oncology

## 2015-03-29 ENCOUNTER — Telehealth: Payer: Self-pay | Admitting: Nurse Practitioner

## 2015-03-29 ENCOUNTER — Ambulatory Visit (HOSPITAL_BASED_OUTPATIENT_CLINIC_OR_DEPARTMENT_OTHER): Payer: Medicare Other

## 2015-03-29 ENCOUNTER — Other Ambulatory Visit: Payer: Self-pay | Admitting: *Deleted

## 2015-03-29 ENCOUNTER — Telehealth: Payer: Self-pay | Admitting: *Deleted

## 2015-03-29 VITALS — BP 81/51 | HR 103 | Temp 98.0°F | Resp 18 | Ht 60.0 in | Wt 138.2 lb

## 2015-03-29 DIAGNOSIS — Z452 Encounter for adjustment and management of vascular access device: Secondary | ICD-10-CM | POA: Diagnosis not present

## 2015-03-29 DIAGNOSIS — C7989 Secondary malignant neoplasm of other specified sites: Secondary | ICD-10-CM | POA: Diagnosis not present

## 2015-03-29 DIAGNOSIS — C189 Malignant neoplasm of colon, unspecified: Secondary | ICD-10-CM

## 2015-03-29 DIAGNOSIS — D509 Iron deficiency anemia, unspecified: Secondary | ICD-10-CM | POA: Diagnosis not present

## 2015-03-29 DIAGNOSIS — E119 Type 2 diabetes mellitus without complications: Secondary | ICD-10-CM | POA: Diagnosis not present

## 2015-03-29 DIAGNOSIS — G893 Neoplasm related pain (acute) (chronic): Secondary | ICD-10-CM

## 2015-03-29 DIAGNOSIS — R110 Nausea: Secondary | ICD-10-CM

## 2015-03-29 MED ORDER — TEMAZEPAM 15 MG PO CAPS: ORAL_CAPSULE | ORAL | Status: AC

## 2015-03-29 MED ORDER — ONDANSETRON HCL 8 MG PO TABS: 8.0000 mg | ORAL_TABLET | Freq: Two times a day (BID) | ORAL | Status: AC | PRN

## 2015-03-29 MED ORDER — HEPARIN SOD (PORK) LOCK FLUSH 100 UNIT/ML IV SOLN
500.0000 [IU] | Freq: Once | INTRAVENOUS | Status: AC
  Administered 2015-03-29: 500 [IU] via INTRAVENOUS
  Filled 2015-03-29: qty 5

## 2015-03-29 MED ORDER — SODIUM CHLORIDE 0.9 % IJ SOLN
10.0000 mL | INTRAMUSCULAR | Status: DC | PRN
  Administered 2015-03-29: 10 mL via INTRAVENOUS
  Filled 2015-03-29: qty 10

## 2015-03-29 MED ORDER — ONDANSETRON HCL 8 MG PO TABS: 8.0000 mg | ORAL_TABLET | Freq: Two times a day (BID) | ORAL | Status: DC | PRN

## 2015-03-29 MED ORDER — OXYCODONE HCL 10 MG PO TABS
10.0000 mg | ORAL_TABLET | ORAL | Status: DC | PRN
Start: 2015-03-29 — End: 2015-05-06

## 2015-03-29 MED ORDER — MORPHINE SULFATE ER 30 MG PO TBCR: 30.0000 mg | EXTENDED_RELEASE_TABLET | Freq: Every day | ORAL | Status: DC

## 2015-03-29 NOTE — Patient Instructions (Signed)

## 2015-03-29 NOTE — Telephone Encounter (Signed)
MS CONTIN 30MG  PRESCRIPTION WAS WRITTEN FOR ONE TABLET DAILY. VERBAL ORDER AND READ BACK TO DR.Tanque Verde. WILL ONLY TAKE THE MS CONTIN 30MG  ONCE A DAY. NOTIFIED THE PHARMACIST AT [facility].

## 2015-03-29 NOTE — Telephone Encounter (Signed)
Pt confirmed labs/ov per 07/25 POF, gave pt AVS and Calendar.... KJ °

## 2015-03-29 NOTE — Addendum Note (Signed)
Addended by: Domenic Schwab on: 03/29/2015 11:47 AM   Modules accepted: Orders

## 2015-03-29 NOTE — Progress Notes (Signed)
  [facility] OFFICE PROGRESS NOTE   Diagnosis: Colon cancer  INTERVAL HISTORY:   Yolanda Friedman returns as scheduled. She reports good pain control at present with once daily MS Contin and as needed oxycodone. She had several days of nausea and vomiting when she was vacationing in [place]. She reports being evaluated in an emergency room there. The nausea has improved and she is now eating. Yolanda Friedman continues to have abdominal and back pain.  She will not agree to imaging or further systemic therapy.  Objective:  Vital signs in last 24 hours:  Blood pressure 81/51, pulse 103, temperature 98 F (36.7 C), temperature source Oral, resp. rate 18, height 5' (1.524 m), weight 138 lb 3.2 oz (62.687 kg), SpO2 100 %. repeat manual blood pressure 84/52    Resp: Lungs clear bilaterally Cardio: Regular rate and rhythm GI: The abdomen is soft, mass like fullness in the right upper abdomen and left mid abdomen, no hepatomegaly Vascular: No leg edema  Medications: I have reviewed the patient's current medications.  Assessment/Plan: 1. Stage IV colon cancer, K-ras mutation detected  Initial diagnosis 2005  Recurrent disease April 2012, status post a hysterectomy, bilateral salpingo-oophorectomy, omentectomy, and radical debulking by Dr. 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 Diabetes  3. History of Hypertension  4. Iron deficiency anemia-maintained on iron  5. CVA October 2014  6. Back pain-likely related  to metastatic colon cancer involving the retroperitoneum or potentially the spine.    Disposition:  Yolanda Friedman has metastatic colon cancer. The abdomen/back pain is very likely secondary to progressive metastatic disease. She declines reimaging studies and further systemic therapy. She will continue the current pain regimen. Yolanda Friedman will return for an office visit and Port-A-Cath flush 05/06/2015.  She has lost a significant amount of weight and is hypotensive today. I recommended she discontinue the amlodipine/valsartan.  Betsy Coder, MD  03/29/2015  11:12 AM

## 2015-03-29 NOTE — Addendum Note (Signed)
Addended by: Domenic Schwab on: 03/29/2015 11:44 AM   Modules accepted: Orders

## 2015-04-02 ENCOUNTER — Telehealth: Payer: Self-pay

## 2015-04-02 DIAGNOSIS — K5900 Constipation, unspecified: Secondary | ICD-10-CM

## 2015-04-02 MED ORDER — PROCHLORPERAZINE MALEATE 10 MG PO TABS: 10.0000 mg | ORAL_TABLET | Freq: Four times a day (QID) | ORAL | Status: DC | PRN

## 2015-04-02 NOTE — Telephone Encounter (Signed)
Pt called asking for return call. When I called back she had listened to my message, she had called before listening to message.

## 2015-04-02 NOTE — Telephone Encounter (Signed)
lvm with change in nausea medication to compazine. E-scribed to pharmacy.

## 2015-04-02 NOTE — Telephone Encounter (Signed)
Returning call. Pt miralax is not working well. She has tried x-lax, mag citrate, and other preps. Mag citrate takes about 2 days to work. She is using her zofran each morning.  Fleets did not work. Could we rx a different nausea medication d/t zofran SE of constipation?

## 2015-04-02 NOTE — Telephone Encounter (Signed)
She can try compazine 5-10 mg every 6 hours as needed for nausea.

## 2015-04-26 ENCOUNTER — Other Ambulatory Visit: Payer: Self-pay | Admitting: Nurse Practitioner

## 2015-05-06 ENCOUNTER — Ambulatory Visit (HOSPITAL_BASED_OUTPATIENT_CLINIC_OR_DEPARTMENT_OTHER): Payer: Medicare Other

## 2015-05-06 ENCOUNTER — Ambulatory Visit (HOSPITAL_BASED_OUTPATIENT_CLINIC_OR_DEPARTMENT_OTHER): Payer: Medicare Other | Admitting: Nurse Practitioner

## 2015-05-06 VITALS — BP 130/72 | HR 85 | Temp 98.3°F | Resp 18 | Ht 60.0 in | Wt 130.5 lb

## 2015-05-06 DIAGNOSIS — Z95828 Presence of other vascular implants and grafts: Secondary | ICD-10-CM

## 2015-05-06 DIAGNOSIS — D509 Iron deficiency anemia, unspecified: Secondary | ICD-10-CM | POA: Diagnosis not present

## 2015-05-06 DIAGNOSIS — C189 Malignant neoplasm of colon, unspecified: Secondary | ICD-10-CM

## 2015-05-06 DIAGNOSIS — C7989 Secondary malignant neoplasm of other specified sites: Secondary | ICD-10-CM

## 2015-05-06 DIAGNOSIS — E119 Type 2 diabetes mellitus without complications: Secondary | ICD-10-CM

## 2015-05-06 DIAGNOSIS — I10 Essential (primary) hypertension: Secondary | ICD-10-CM

## 2015-05-06 DIAGNOSIS — G893 Neoplasm related pain (acute) (chronic): Secondary | ICD-10-CM

## 2015-05-06 DIAGNOSIS — Z452 Encounter for adjustment and management of vascular access device: Secondary | ICD-10-CM

## 2015-05-06 MED ORDER — OXYCODONE HCL 10 MG PO TABS: 10.0000 mg | ORAL_TABLET | ORAL | Status: DC | PRN

## 2015-05-06 MED ORDER — SODIUM CHLORIDE 0.9 % IJ SOLN
10.0000 mL | INTRAMUSCULAR | Status: DC | PRN
  Administered 2015-05-06: 10 mL via INTRAVENOUS
  Filled 2015-05-06: qty 10

## 2015-05-06 MED ORDER — HEPARIN SOD (PORK) LOCK FLUSH 100 UNIT/ML IV SOLN
500.0000 [IU] | Freq: Once | INTRAVENOUS | Status: AC
  Administered 2015-05-06: 500 [IU] via INTRAVENOUS
  Filled 2015-05-06: qty 5

## 2015-05-06 NOTE — Patient Instructions (Signed)

## 2015-05-06 NOTE — Progress Notes (Addendum)
  [facility] OFFICE PROGRESS NOTE   Diagnosis:  Colon cancer  INTERVAL HISTORY:   Yolanda Friedman returns as scheduled. She continues to have intermittent nausea/vomiting. She attributes the nausea and vomiting to intake of liquids. Back and abdominal pain are well-controlled with MS Contin once a day and oxycodone 1-2 times a day. Appetite is poor. She is losing weight. She continues to have constipation.  Objective:  Vital signs in last 24 hours:  Blood pressure 130/72, pulse 85, temperature 98.3 F (36.8 C), temperature source Oral, resp. rate 18, height 5' (1.524 m), weight 130 lb 8 oz (59.194 kg), SpO2 96 %.    HEENT: No thrush or ulcers. Resp: Lungs clear bilaterally. Cardio: Regular rate and rhythm. GI: Masslike fullness left mid abdomen and right mid to lower abdomen. No hepatomegaly. Vascular: No leg edema. Port-A-Cath without erythema.  Lab Results:  Lab Results  Component Value Date   WBC 5.5 08/13/2014   HGB 11.5* 08/13/2014   HCT 35.5 08/13/2014   MCV 89.9 08/13/2014   PLT 205 08/13/2014   NEUTROABS 3.1 08/13/2014    Imaging:  No results found.  Medications: I have reviewed the patient's current medications.  Assessment/Plan: 1. Stage IV colon cancer, K-ras mutation detected  Initial diagnosis 2005  Recurrent disease April 2012, status post a hysterectomy, bilateral salpingo-oophorectomy, omentectomy, and radical debulking by Dr. 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 Diabetes  3.  History of Hypertension  4. Iron deficiency anemia-maintained on iron  5. CVA October 2014  6. Back pain-likely related to metastatic colon cancer involving the retroperitoneum or potentially the spine   Disposition: Yolanda Friedman has metastatic colon cancer. She understands the back and abdominal pain and the nausea/vomiting are most likely related to cancer. She continues to decline imaging, systemic therapy. She prefers to continue to be followed on a supportive care approach. She was issued a new perception for oxycodone. She has antiemetics for the nausea. We discussed an appetite stimulant. She declines prednisone and Megace. For constipation she will try Miralax.  She will return for a follow-up visit in 5-6 weeks. She will contact the office in the interim with any problems.  Patient seen with Dr. Benay Spice.    Ned Card ANP/GNP-BC   05/06/2015  1:19 PM   This was a shared visit with Ned Card. Yolanda Friedman was interviewed and examined. We explained her symptoms are related to progression of the metastatic colon cancer. She declines imaging studies and treatment of the cancer.  Julieanne Manson, M.D.

## 2015-05-07 ENCOUNTER — Telehealth: Payer: Self-pay | Admitting: Oncology

## 2015-05-07 NOTE — Telephone Encounter (Signed)
Confirmed appointment for October 10.

## 2015-05-12 ENCOUNTER — Telehealth: Payer: Self-pay | Admitting: *Deleted

## 2015-05-12 NOTE — Telephone Encounter (Signed)
Patient called stating that her constipation is not getting better. She has tried every over the counter medication (view previous note). Patient would like a rx called in for her constipation. Please advise. Message sent to Ned Card NP/[facility] LPN.

## 2015-05-12 NOTE — Telephone Encounter (Signed)
Im sure she can. She just wants something other that what she has taken so far.

## 2015-05-12 NOTE — Telephone Encounter (Signed)
Can she take a liquid laxative?

## 2015-06-04 ENCOUNTER — Other Ambulatory Visit: Payer: Self-pay | Admitting: *Deleted

## 2015-06-04 DIAGNOSIS — C189 Malignant neoplasm of colon, unspecified: Secondary | ICD-10-CM

## 2015-06-04 MED ORDER — MORPHINE SULFATE ER 30 MG PO TBCR: 30.0000 mg | EXTENDED_RELEASE_TABLET | Freq: Every day | ORAL | Status: DC

## 2015-06-04 NOTE — Telephone Encounter (Signed)
Call from pt requesting refill on long acting pain med. She reports she is taking MS Contin once daily. Will send her son to pick up Rx on 10/3. Has enough breakthrough pain med to last until next visit. Rx left in prescription book for pick up.

## 2015-06-14 ENCOUNTER — Ambulatory Visit (HOSPITAL_BASED_OUTPATIENT_CLINIC_OR_DEPARTMENT_OTHER): Payer: Medicare Other | Admitting: Oncology

## 2015-06-14 ENCOUNTER — Ambulatory Visit (HOSPITAL_BASED_OUTPATIENT_CLINIC_OR_DEPARTMENT_OTHER): Payer: Medicare Other

## 2015-06-14 ENCOUNTER — Telehealth: Payer: Self-pay | Admitting: Oncology

## 2015-06-14 VITALS — BP 132/86 | HR 106 | Temp 98.1°F | Resp 18 | Ht 60.0 in | Wt 121.1 lb

## 2015-06-14 DIAGNOSIS — C189 Malignant neoplasm of colon, unspecified: Secondary | ICD-10-CM

## 2015-06-14 DIAGNOSIS — Z452 Encounter for adjustment and management of vascular access device: Secondary | ICD-10-CM | POA: Diagnosis not present

## 2015-06-14 DIAGNOSIS — I10 Essential (primary) hypertension: Secondary | ICD-10-CM

## 2015-06-14 DIAGNOSIS — M549 Dorsalgia, unspecified: Secondary | ICD-10-CM

## 2015-06-14 DIAGNOSIS — E119 Type 2 diabetes mellitus without complications: Secondary | ICD-10-CM

## 2015-06-14 DIAGNOSIS — D509 Iron deficiency anemia, unspecified: Secondary | ICD-10-CM | POA: Diagnosis not present

## 2015-06-14 DIAGNOSIS — Z23 Encounter for immunization: Secondary | ICD-10-CM | POA: Diagnosis not present

## 2015-06-14 DIAGNOSIS — C7989 Secondary malignant neoplasm of other specified sites: Secondary | ICD-10-CM | POA: Diagnosis not present

## 2015-06-14 DIAGNOSIS — Z95828 Presence of other vascular implants and grafts: Secondary | ICD-10-CM

## 2015-06-14 DIAGNOSIS — R112 Nausea with vomiting, unspecified: Secondary | ICD-10-CM

## 2015-06-14 MED ORDER — HEPARIN SOD (PORK) LOCK FLUSH 100 UNIT/ML IV SOLN
500.0000 [IU] | Freq: Once | INTRAVENOUS | Status: AC
  Administered 2015-06-14: 500 [IU] via INTRAVENOUS
  Filled 2015-06-14: qty 5

## 2015-06-14 MED ORDER — INFLUENZA VAC SPLIT QUAD 0.5 ML IM SUSY
0.5000 mL | PREFILLED_SYRINGE | Freq: Once | INTRAMUSCULAR | Status: AC
  Administered 2015-06-14: 0.5 mL via INTRAMUSCULAR
  Filled 2015-06-14: qty 0.5

## 2015-06-14 MED ORDER — OXYCODONE HCL 10 MG PO TABS: 10.0000 mg | ORAL_TABLET | ORAL | Status: DC | PRN

## 2015-06-14 MED ORDER — SODIUM CHLORIDE 0.9 % IJ SOLN
10.0000 mL | INTRAMUSCULAR | Status: DC | PRN
Start: 2015-06-14 — End: 2015-06-14
  Administered 2015-06-14: 10 mL via INTRAVENOUS
  Filled 2015-06-14: qty 10

## 2015-06-14 NOTE — Telephone Encounter (Signed)
lvm for pt regarding to NOV appt.. °

## 2015-06-14 NOTE — Progress Notes (Signed)
  [place] OFFICE PROGRESS NOTE   Diagnosis: Colon cancer  INTERVAL HISTORY:   Mr. Hornak returns as scheduled. She reports good control of the back and abdomen pain with the current narcotic regimen. She has intermittent nausea and vomiting. She vomits multiple times per week. Her bowels are functioning.  Objective:  Vital signs in last 24 hours:  There were no vitals taken for this visit.     Resp: Lungs clear bilaterally Cardio: Regular rate and rhythm GI: No hepatomegaly, no apparent ascites, masses are palpable at the right upper abdomen, upper mid abdomen, and lower mid abdomen. Vascular: No leg edema  Portacath/PICC-without erythema  Lab Results:  Lab Results  Component Value Date   WBC 5.5 08/13/2014   HGB 11.5* 08/13/2014   HCT 35.5 08/13/2014   MCV 89.9 08/13/2014   PLT 205 08/13/2014   NEUTROABS 3.1 08/13/2014      Lab Results  Component Value Date   CEA 52.4* 05/19/2014     Medications: I have reviewed the patient's current medications.  Assessment/Plan: 1. Stage IV colon cancer, K-ras mutation detected  Initial diagnosis 2005  Recurrent disease April 2012, status post a hysterectomy, bilateral salpingo-oophorectomy, omentectomy, and radical debulking by Dr. 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 Diabetes  3. History of Hypertension  4. Iron deficiency anemia-maintained on iron  5. CVA October 2014  6. Back pain-likely related to metastatic colon cancer  involving the retroperitoneum or potentially the spine  7.    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 She is losing weight. Her symptoms are very likely related to progression of the colon cancer. She declines x-rays. She also declines treatment of the cancer. Ms. Armato will continue the current narcotic regimen. She will try Senokot-S for constipation. She received an influenza vaccine today. Ms. Elane Fritz will return for an office visit and Port-A-Cath flush in 6 weeks. She continues to lose weight. We will ask the Cancer center nutritionist to contact her.  Betsy Coder, MD  06/14/2015  11:24 AM

## 2015-06-14 NOTE — Patient Instructions (Signed)

## 2015-06-16 ENCOUNTER — Telehealth: Payer: Self-pay | Admitting: Nutrition

## 2015-06-16 NOTE — Telephone Encounter (Signed)
Contacted patient by telephone who reports N and V. Does not like to drink regular Ensure or Boost. States she is lactose intolerant.  Endorses weight loss. Educated patient on strategies for increased calories and protein during nausea and vomiting. Recommend patient try clear oral nutrition supplements. Provided samples and fact sheets and coupons for patient to pick up today. Also provided contact information.

## 2015-07-10 ENCOUNTER — Encounter (HOSPITAL_COMMUNITY): Payer: Self-pay

## 2015-07-10 ENCOUNTER — Emergency Department (HOSPITAL_COMMUNITY): Payer: Medicare Other

## 2015-07-10 ENCOUNTER — Inpatient Hospital Stay (HOSPITAL_COMMUNITY)
Admission: EM | Admit: 2015-07-10 | Discharge: 2015-07-12 | DRG: 640 | Disposition: A | Discharge status: Hospice | Payer: Medicare Other | Attending: Internal Medicine | Admitting: Internal Medicine

## 2015-07-10 DIAGNOSIS — Z8249 Family history of ischemic heart disease and other diseases of the circulatory system: Secondary | ICD-10-CM | POA: Diagnosis not present

## 2015-07-10 DIAGNOSIS — R1110 Vomiting, unspecified: Secondary | ICD-10-CM | POA: Insufficient documentation

## 2015-07-10 DIAGNOSIS — C189 Malignant neoplasm of colon, unspecified: Secondary | ICD-10-CM | POA: Diagnosis present

## 2015-07-10 DIAGNOSIS — C785 Secondary malignant neoplasm of large intestine and rectum: Secondary | ICD-10-CM

## 2015-07-10 DIAGNOSIS — Z6821 Body mass index (BMI) 21.0-21.9, adult: Secondary | ICD-10-CM

## 2015-07-10 DIAGNOSIS — R000 Tachycardia, unspecified: Secondary | ICD-10-CM | POA: Diagnosis not present

## 2015-07-10 DIAGNOSIS — E860 Dehydration: Secondary | ICD-10-CM | POA: Diagnosis not present

## 2015-07-10 DIAGNOSIS — E43 Unspecified severe protein-calorie malnutrition: Secondary | ICD-10-CM | POA: Diagnosis present

## 2015-07-10 DIAGNOSIS — Z7189 Other specified counseling: Secondary | ICD-10-CM | POA: Insufficient documentation

## 2015-07-10 DIAGNOSIS — R627 Adult failure to thrive: Secondary | ICD-10-CM

## 2015-07-10 DIAGNOSIS — E119 Type 2 diabetes mellitus without complications: Secondary | ICD-10-CM | POA: Diagnosis present

## 2015-07-10 DIAGNOSIS — I10 Essential (primary) hypertension: Secondary | ICD-10-CM | POA: Diagnosis not present

## 2015-07-10 DIAGNOSIS — Z833 Family history of diabetes mellitus: Secondary | ICD-10-CM | POA: Diagnosis not present

## 2015-07-10 DIAGNOSIS — G893 Neoplasm related pain (acute) (chronic): Secondary | ICD-10-CM | POA: Diagnosis present

## 2015-07-10 DIAGNOSIS — R112 Nausea with vomiting, unspecified: Secondary | ICD-10-CM | POA: Diagnosis present

## 2015-07-10 DIAGNOSIS — E876 Hypokalemia: Principal | ICD-10-CM | POA: Diagnosis present

## 2015-07-10 DIAGNOSIS — K219 Gastro-esophageal reflux disease without esophagitis: Secondary | ICD-10-CM | POA: Diagnosis present

## 2015-07-10 DIAGNOSIS — Z79891 Long term (current) use of opiate analgesic: Secondary | ICD-10-CM | POA: Diagnosis not present

## 2015-07-10 DIAGNOSIS — Z7982 Long term (current) use of aspirin: Secondary | ICD-10-CM | POA: Diagnosis not present

## 2015-07-10 DIAGNOSIS — Z79899 Other long term (current) drug therapy: Secondary | ICD-10-CM | POA: Diagnosis not present

## 2015-07-10 DIAGNOSIS — Z7984 Long term (current) use of oral hypoglycemic drugs: Secondary | ICD-10-CM | POA: Diagnosis not present

## 2015-07-10 DIAGNOSIS — Z66 Do not resuscitate: Secondary | ICD-10-CM | POA: Diagnosis not present

## 2015-07-10 DIAGNOSIS — F419 Anxiety disorder, unspecified: Secondary | ICD-10-CM | POA: Diagnosis present

## 2015-07-10 DIAGNOSIS — Z9049 Acquired absence of other specified parts of digestive tract: Secondary | ICD-10-CM

## 2015-07-10 DIAGNOSIS — C799 Secondary malignant neoplasm of unspecified site: Secondary | ICD-10-CM | POA: Insufficient documentation

## 2015-07-10 DIAGNOSIS — Z515 Encounter for palliative care: Secondary | ICD-10-CM | POA: Diagnosis not present

## 2015-07-10 DIAGNOSIS — Z809 Family history of malignant neoplasm, unspecified: Secondary | ICD-10-CM | POA: Diagnosis not present

## 2015-07-10 DIAGNOSIS — C801 Malignant (primary) neoplasm, unspecified: Secondary | ICD-10-CM | POA: Diagnosis not present

## 2015-07-10 LAB — COMPREHENSIVE METABOLIC PANEL
ALT: 26 U/L (ref 14–54)
ANION GAP: 14 (ref 5–15)
AST: 48 U/L — ABNORMAL HIGH (ref 15–41)
Albumin: 2.6 g/dL — ABNORMAL LOW (ref 3.5–5.0)
Alkaline Phosphatase: 70 U/L (ref 38–126)
BUN: 19 mg/dL (ref 6–20)
CALCIUM: 8.4 mg/dL — AB (ref 8.9–10.3)
CHLORIDE: 93 mmol/L — AB (ref 101–111)
CO2: 32 mmol/L (ref 22–32)
CREATININE: 0.83 mg/dL (ref 0.44–1.00)
GFR calc non Af Amer: >60 mL/min (ref >60)
Glucose, Bld: 244 mg/dL — ABNORMAL HIGH (ref 65–99)
Potassium: <2 mmol/L — CL (ref 3.5–5.1)
Sodium: 139 mmol/L (ref 135–145)
Total Bilirubin: 0.9 mg/dL (ref 0.3–1.2)
Total Protein: 5.9 g/dL — ABNORMAL LOW (ref 6.5–8.1)

## 2015-07-10 LAB — URINALYSIS, ROUTINE W REFLEX MICROSCOPIC
Glucose, UA: 500 mg/dL — AB
Ketones, ur: 40 mg/dL — AB
LEUKOCYTES UA: NEGATIVE
NITRITE: NEGATIVE
PROTEIN: NEGATIVE mg/dL
Specific Gravity, Urine: 1.011 (ref 1.005–1.030)
UROBILINOGEN UA: 2 mg/dL — AB (ref 0.0–1.0)
pH: 6.5 (ref 5.0–8.0)

## 2015-07-10 LAB — CBC WITH DIFFERENTIAL/PLATELET
BASOS PCT: 0 %
Basophils Absolute: 0 10*3/uL (ref 0.0–0.1)
Eosinophils Absolute: 0.1 10*3/uL (ref 0.0–0.7)
Eosinophils Relative: 1 %
HEMATOCRIT: 31.3 % — AB (ref 36.0–46.0)
HEMOGLOBIN: 10.5 g/dL — AB (ref 12.0–15.0)
LYMPHS ABS: 1.6 10*3/uL (ref 0.7–4.0)
LYMPHS PCT: 23 %
MCH: 28.3 pg (ref 26.0–34.0)
MCHC: 33.5 g/dL (ref 30.0–36.0)
MCV: 84.4 fL (ref 78.0–100.0)
MONO ABS: 0.6 10*3/uL (ref 0.1–1.0)
MONOS PCT: 8 %
NEUTROS ABS: 4.8 10*3/uL (ref 1.7–7.7)
Neutrophils Relative %: 68 %
Platelets: 271 10*3/uL (ref 150–400)
RBC: 3.71 MIL/uL — ABNORMAL LOW (ref 3.87–5.11)
RDW: 14.5 % (ref 11.5–15.5)
WBC: 7 10*3/uL (ref 4.0–10.5)

## 2015-07-10 LAB — URINE MICROSCOPIC-ADD ON

## 2015-07-10 LAB — GLUCOSE, CAPILLARY: Glucose-Capillary: 205 mg/dL — ABNORMAL HIGH (ref 65–99)

## 2015-07-10 LAB — LIPASE, BLOOD: LIPASE: 33 U/L (ref 11–51)

## 2015-07-10 LAB — MAGNESIUM: MAGNESIUM: 1.4 mg/dL — AB (ref 1.7–2.4)

## 2015-07-10 MED ORDER — POLYETHYLENE GLYCOL 3350 17 G PO PACK
17.0000 g | PACK | Freq: Every day | ORAL | Status: DC
  Administered 2015-07-10 – 2015-07-11 (×2): 17 g via ORAL
  Filled 2015-07-10 (×4): qty 1

## 2015-07-10 MED ORDER — ALBUTEROL SULFATE HFA 108 (90 BASE) MCG/ACT IN AERS: 2.0000 | INHALATION_SPRAY | Freq: Four times a day (QID) | RESPIRATORY_TRACT | Status: DC | PRN

## 2015-07-10 MED ORDER — OXYCODONE HCL 5 MG PO TABS: 10.0000 mg | ORAL_TABLET | ORAL | Status: DC | PRN

## 2015-07-10 MED ORDER — PANTOPRAZOLE SODIUM 40 MG IV SOLR
40.0000 mg | INTRAVENOUS | Status: DC
  Administered 2015-07-10 – 2015-07-11 (×2): 40 mg via INTRAVENOUS
  Filled 2015-07-10 (×3): qty 40

## 2015-07-10 MED ORDER — ENSURE ENLIVE PO LIQD
237.0000 mL | Freq: Two times a day (BID) | ORAL | Status: DC
  Administered 2015-07-11 – 2015-07-12 (×2): 237 mL via ORAL

## 2015-07-10 MED ORDER — POTASSIUM CHLORIDE 10 MEQ/100ML IV SOLN
10.0000 meq | INTRAVENOUS | Status: AC
  Administered 2015-07-10 (×6): 10 meq via INTRAVENOUS
  Filled 2015-07-10 (×3): qty 100

## 2015-07-10 MED ORDER — SODIUM CHLORIDE 0.9 % IJ SOLN
10.0000 mL | INTRAMUSCULAR | Status: DC | PRN
  Administered 2015-07-11 – 2015-07-12 (×2): 10 mL
  Filled 2015-07-10 (×2): qty 40

## 2015-07-10 MED ORDER — MORPHINE SULFATE (PF) 2 MG/ML IV SOLN: 1.0000 mg | INTRAVENOUS | Status: DC | PRN

## 2015-07-10 MED ORDER — TEMAZEPAM 7.5 MG PO CAPS
7.5000 mg | ORAL_CAPSULE | Freq: Every evening | ORAL | Status: DC | PRN
  Administered 2015-07-10 – 2015-07-11 (×2): 7.5 mg via ORAL
  Filled 2015-07-10 (×2): qty 1

## 2015-07-10 MED ORDER — POTASSIUM CHLORIDE IN NACL 20-0.9 MEQ/L-% IV SOLN
INTRAVENOUS | Status: DC
Start: 2015-07-10 — End: 2015-07-12
  Administered 2015-07-10 – 2015-07-11 (×3): via INTRAVENOUS
  Filled 2015-07-10 (×4): qty 1000

## 2015-07-10 MED ORDER — ONDANSETRON HCL 4 MG/2ML IJ SOLN
4.0000 mg | Freq: Once | INTRAMUSCULAR | Status: AC
  Administered 2015-07-10: 4 mg via INTRAVENOUS
  Filled 2015-07-10: qty 2

## 2015-07-10 MED ORDER — SODIUM CHLORIDE 0.9 % IV SOLN: INTRAVENOUS | Status: AC

## 2015-07-10 MED ORDER — INSULIN ASPART 100 UNIT/ML ~~LOC~~ SOLN
0.0000 [IU] | Freq: Three times a day (TID) | SUBCUTANEOUS | Status: DC
  Administered 2015-07-11 (×2): 1 [IU] via SUBCUTANEOUS
  Administered 2015-07-12: 2 [IU] via SUBCUTANEOUS

## 2015-07-10 MED ORDER — PROMETHAZINE HCL 25 MG/ML IJ SOLN
12.5000 mg | Freq: Four times a day (QID) | INTRAMUSCULAR | Status: DC | PRN
  Administered 2015-07-12: 12.5 mg via INTRAVENOUS
  Filled 2015-07-10: qty 1

## 2015-07-10 MED ORDER — SODIUM CHLORIDE 0.9 % IV BOLUS (SEPSIS)
1000.0000 mL | Freq: Once | INTRAVENOUS | Status: AC
  Administered 2015-07-10: 1000 mL via INTRAVENOUS

## 2015-07-10 MED ORDER — MAGNESIUM SULFATE 2 GM/50ML IV SOLN
2.0000 g | Freq: Once | INTRAVENOUS | Status: AC
  Administered 2015-07-10: 2 g via INTRAVENOUS
  Filled 2015-07-10: qty 50

## 2015-07-10 MED ORDER — MORPHINE SULFATE ER 30 MG PO TBCR
30.0000 mg | EXTENDED_RELEASE_TABLET | Freq: Every day | ORAL | Status: DC
  Administered 2015-07-10 – 2015-07-12 (×3): 30 mg via ORAL
  Filled 2015-07-10 (×3): qty 1

## 2015-07-10 MED ORDER — POTASSIUM CHLORIDE 10 MEQ/100ML IV SOLN
10.0000 meq | Freq: Once | INTRAVENOUS | Status: AC
  Administered 2015-07-10: 10 meq via INTRAVENOUS
  Filled 2015-07-10: qty 100

## 2015-07-10 MED ORDER — OXYCODONE HCL 10 MG PO TABS: 10.0000 mg | ORAL_TABLET | ORAL | Status: DC | PRN

## 2015-07-10 MED ORDER — ALBUTEROL SULFATE (2.5 MG/3ML) 0.083% IN NEBU: 2.5000 mg | INHALATION_SOLUTION | Freq: Four times a day (QID) | RESPIRATORY_TRACT | Status: DC | PRN

## 2015-07-10 MED ORDER — POTASSIUM CHLORIDE 20 MEQ/15ML (10%) PO SOLN
40.0000 meq | Freq: Once | ORAL | Status: AC
  Administered 2015-07-10: 40 meq via ORAL
  Filled 2015-07-10: qty 30

## 2015-07-10 NOTE — ED Notes (Signed)
Pt refused all xrays and CT scans

## 2015-07-10 NOTE — Progress Notes (Signed)
Utilization Review Completed.Skii Cleland T11/01/2015  

## 2015-07-10 NOTE — H&P (Addendum)
History and Physical  Yolanda Friedman ZOX:[id number] DOB: 06/21/1951 DOA: 07/10/2015  Referring physician: EDP PCP: Dionisio Paschal, NP   Chief Complaint: n/v, progressive weakness  HPI: Yolanda Friedman is a 64 y.o. female   With metastatic colon cancer has decided doe not want to have further therapy for cancer, "just want to be comfortable" presented to the ED due to n/v, not eating, dehydration and progressive weakness, upon presenting to the ED, she was found to have sinus tachycardia, but bp stable, labs showed critically low potassium, k less than 2, she was given hydration , potassium supplement started, hospitalist called to admit the patient. Patient is very frail and emaciated, but still able to ambulate without assistance, she reported her cancer pain is under control with her current home regimen, she has decided not getting more cancer therapy, she has become progressively weak and continue to loose weight, very poor appetite, more n/v, has not been eating or drinking well, since last night she has not been eating or drinking any thing due to afraid she is going to vomit. She denies fever, no chest pain, no edema. No constipation or diarrhea. She has refused imaging that was offered in the ED.  Review of Systems:  Detail per HPI, Review of systems are otherwise negative  Past Medical History  Diagnosis Date  . Hypertension   . Cancer ([place])   . Colon cancer [facility]) 2005    metastatic 2012  . 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 Diabetes mellitus   . HTN (hypertension) 12/22/2011  . Shortness of breath   . GERD (gastroesophageal reflux disease)   . Anxiety   . Normocytic normochromic anemia 06/26/2013   Past Surgical History  Procedure Laterality Date  . Cholecystectomy  1987  . Colectomy  2005    colon ca  . Abdominal hysterectomy  12/20/2010    met colon ca  . Portacath placement  01/2011  . Cataract extraction w/phaco  08/08/2012    Procedure: CATARACT  EXTRACTION PHACO AND INTRAOCULAR LENS PLACEMENT (IOC);  Surgeon: Tonny Branch, MD;  Location: [facility];  Service: Ophthalmology;  Laterality: Right;  CDE:16.18  . Cataract extraction w/phaco  08/26/2012    Procedure: CATARACT EXTRACTION PHACO AND INTRAOCULAR LENS PLACEMENT (IOC);  Surgeon: Tonny Branch, MD;  Location: [facility];  Service: Ophthalmology;  Laterality: Left;  CDE 7.64   Social History:  reports that she has never smoked. She has never used smokeless tobacco. She reports that she drinks alcohol. She reports that she uses illicit drugs (Marijuana and Solvent inhalants). Patient lives at home with good family support & is able to participate in activities of daily living independently   No Known Allergies  Family History  Problem Relation Age of Onset  . Cancer Mother   . Hypertension Sister   . Diabetes Sister   . Hypertension Maternal Uncle       Prior to Admission medications   Medication Sig Start Date End Date Taking? Authorizing Provider  albuterol (PROVENTIL HFA;VENTOLIN HFA) 108 (90 BASE) MCG/ACT inhaler Inhale 2 puffs into the lungs every 6 (six) hours as needed for wheezing. 05/19/14  Yes Baird Cancer, PA-C  aspirin 325 MG tablet Take 325 mg by mouth daily.  06/27/13  Yes Kathie Dike, MD  ferrous fumarate (HEMOCYTE) 325 (106 FE) MG TABS tablet Take 1 tablet (106 mg of iron total) by mouth 2 (two) times daily. Patient taking differently: Take 1 tablet by mouth daily.  08/19/14  Yes  Ladell Pier, MD  metFORMIN (GLUCOPHAGE) 500 MG tablet Take 1 tablet (500 mg total) by mouth daily with breakfast. 03/24/14  Yes Baird Cancer, PA-C  morphine (MS CONTIN) 30 MG 12 hr tablet Take 1 tablet (30 mg total) by mouth daily. 06/04/15  Yes Ladell Pier, MD  Multiple Vitamins-Minerals (ONE-A-DAY 50 PLUS PO) Take 1 tablet by mouth daily.    Yes Historical Provider, MD  omeprazole (PRILOSEC) 20 MG capsule Take 1 capsule (20 mg total) by mouth 2 (two) times daily. 10/02/14  Yes Ladell Pier, MD  Oxycodone HCl 10 MG TABS Take 1 tablet (10 mg total) by mouth every 4 (four) hours as needed (for pain). 06/14/15  Yes Ladell Pier, MD  polyethylene glycol [facility] [place]) packet Take 17 g by mouth daily.   Yes Historical Provider, MD  temazepam (RESTORIL) 15 MG capsule Take 1-2 capsules by mouth @ bedtime as needed to help you sleep. 03/29/15  Yes Ladell Pier, MD  ondansetron (ZOFRAN) 8 MG tablet Take 1 tablet (8 mg total) by mouth 2 (two) times daily as needed for nausea (vomiting). Patient not taking: Reported on 06/14/2015 03/29/15   Ladell Pier, MD  potassium chloride (K-DUR) 10 MEQ tablet Take 1 tablet (10 mEq total) by mouth daily. Patient not taking: Reported on 06/14/2015 08/19/14   Ladell Pier, MD  prochlorperazine (COMPAZINE) 10 MG tablet TAKE 1 TABLET BY MOUTH EVERY 6 HOURS AS NEEDED FOR NAUSEA AND VOMITING Patient not taking: Reported on 06/14/2015 04/26/15   Ladell Pier, MD    Physical Exam: BP 122/74 mmHg  Pulse 97  Temp(Src) 97.6 F (36.4 C) (Oral)  Resp 16  SpO2 97%  General:  Frail and emaciated, but NAD Eyes: PERRL ENT: dry oral mucosa Neck: supple, no JVD Cardiovascular: RRR Respiratory: CTABL Abdomen: old healed midline surgical scar, palpable multiple masses, positive bowel sounds Skin: no rash Musculoskeletal:  No edema Psychiatric: calm/cooperative Neurologic: no focal findings            Labs on Admission:  Basic Metabolic Panel:  Recent Labs Lab 07/10/15 1340 07/10/15 1342  NA 139  --   K <2.0*  --   CL 93*  --   CO2 32  --   GLUCOSE 244*  --   BUN 19  --   CREATININE 0.83  --   CALCIUM 8.4*  --   MG  --  1.4*   Liver Function Tests:  Recent Labs Lab 07/10/15 1340  AST 48*  ALT 26  ALKPHOS 70  BILITOT 0.9  PROT 5.9*  ALBUMIN 2.6*    Recent Labs Lab 07/10/15 1340  LIPASE 33   No results for input(s): AMMONIA in the last 168 hours. CBC:  Recent Labs Lab 07/10/15 1340  WBC 7.0    NEUTROABS 4.8  HGB 10.5*  HCT 31.3*  MCV 84.4  PLT 271   Cardiac Enzymes: No results for input(s): CKTOTAL, CKMB, CKMBINDEX, TROPONINI in the last 168 hours.  BNP (last 3 results) No results for input(s): BNP in the last 8760 hours.  ProBNP (last 3 results) No results for input(s): PROBNP in the last 8760 hours.  CBG: No results for input(s): GLUCAP in the last 168 hours.  Radiological Exams on Admission: No results found.  EKG: pending, sinus on tele  Assessment/Plan Present on Admission:  . Hypokalemia  Hypokalemia: critically low, will need aggressive replacement,   Hypomagnesemia: replace mag  N/v/dehydration: ivf, antiemetics  Cancer pain, continue home pain meds, add iv prn analgesics  Severe malnutrition due to chronic illness, currently not tolerating po, goal of care discussion, continue ivf.  Metastatic colon cancer, FTT, patient does not want further cancer treatment, want to be comfortable, agreed to palliative care consult.  noninsulin dependent diabetes: hold metformin, start ssi.  DVT prophylaxis: scd's  Consultants: palliative care  Code Status: full , discussed with family and patient  Family Communication:  Patient and sister and daughter in law, sister Morey Hummingbird who came from [place] is the preferred  Contact person, [phone]  Disposition Plan: admit to med tele  Time spent: 51mns  Nur Rabold MD, PhD [facility] Pager [phone] 7PM-7AM, please contact night-coverage at [email or web address], password [email or web address]

## 2015-07-10 NOTE — ED Notes (Signed)
Pt is being transferred to [address]. Pt is AO X4. IV potassium is infusing

## 2015-07-10 NOTE — ED Notes (Signed)
I have just relayed pt. K+ of <2.0 to Manuela Schwartz, Therapist, sports.

## 2015-07-10 NOTE — ED Provider Notes (Signed)
CSN: [id number]     Arrival date & time 07/10/15  1207 History   First MD Initiated Contact with Patient 07/10/15 1239     Chief Complaint  Patient presents with  . Emesis     (Consider location/radiation/quality/duration/timing/severity/associated sxs/prior Treatment) Patient is a 64 y.o. female presenting with vomiting. The history is provided by the patient.  Emesis Associated symptoms: abdominal pain   Associated symptoms: no chills    patient with nausea and vomiting. Has had issues dealing with the same chronically. She has metastatic stage IV colon cancer there's no longer under aggressive treatment. No relief with his medicines at home. She states she has stopped her antibiotics because they seem to make her throw up. Been feeling weak at home. Sees Dr. Benay Spice from oncology. She states she is still having bowel movements and passing gas. Her abdomen hurts, but has been chronically hurting for a while now.  Past Medical History  Diagnosis Date  . Hypertension   . Cancer ([place])   . Colon cancer [facility]) 2005    metastatic 2012  . 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 Diabetes mellitus   . HTN (hypertension) 12/22/2011  . Shortness of breath   . GERD (gastroesophageal reflux disease)   . Anxiety   . Normocytic normochromic anemia 06/26/2013   Past Surgical History  Procedure Laterality Date  . Cholecystectomy  1987  . Colectomy  2005    colon ca  . Abdominal hysterectomy  12/20/2010    met colon ca  . Portacath placement  01/2011  . Cataract extraction w/phaco  08/08/2012    Procedure: CATARACT EXTRACTION PHACO AND INTRAOCULAR LENS PLACEMENT (IOC);  Surgeon: Tonny Branch, MD;  Location: [facility];  Service: Ophthalmology;  Laterality: Right;  CDE:16.18  . Cataract extraction w/phaco  08/26/2012    Procedure: CATARACT EXTRACTION PHACO AND INTRAOCULAR LENS PLACEMENT (IOC);  Surgeon: Tonny Branch, MD;  Location: [facility];  Service: Ophthalmology;  Laterality: Left;  CDE  7.64   Family History  Problem Relation Age of Onset  . Cancer Mother   . Hypertension Sister   . Diabetes Sister   . Hypertension Maternal Uncle    Social History  Substance Use Topics  . Smoking status: Never Smoker   . Smokeless tobacco: Never Used  . Alcohol Use: Yes     Comment: occassional   OB History    No data available     Review of Systems  Constitutional: Positive for fatigue. Negative for chills.  Respiratory: Negative for shortness of breath.   Cardiovascular: Negative for chest pain.  Gastrointestinal: Positive for nausea, vomiting and abdominal pain. Negative for constipation.  Genitourinary: Negative for flank pain.  Musculoskeletal: Negative for back pain.  Hematological: Negative for adenopathy.      Allergies  Review of patient's allergies indicates no known allergies.  Home Medications   Prior to Admission medications   Medication Sig Start Date End Date Taking? Authorizing Provider  albuterol (PROVENTIL HFA;VENTOLIN HFA) 108 (90 BASE) MCG/ACT inhaler Inhale 2 puffs into the lungs every 6 (six) hours as needed for wheezing. 05/19/14  Yes Baird Cancer, PA-C  aspirin 325 MG tablet Take 325 mg by mouth daily.  06/27/13  Yes Kathie Dike, MD  ferrous fumarate (HEMOCYTE) 325 (106 FE) MG TABS tablet Take 1 tablet (106 mg of iron total) by mouth 2 (two) times daily. Patient taking differently: Take 1 tablet by mouth daily.  08/19/14  Yes Ladell Pier, MD  metFORMIN (GLUCOPHAGE)  500 MG tablet Take 1 tablet (500 mg total) by mouth daily with breakfast. 03/24/14  Yes Baird Cancer, PA-C  morphine (MS CONTIN) 30 MG 12 hr tablet Take 1 tablet (30 mg total) by mouth daily. 06/04/15  Yes Ladell Pier, MD  Multiple Vitamins-Minerals (ONE-A-DAY 50 PLUS PO) Take 1 tablet by mouth daily.    Yes Historical Provider, MD  omeprazole (PRILOSEC) 20 MG capsule Take 1 capsule (20 mg total) by mouth 2 (two) times daily. 10/02/14  Yes Ladell Pier, MD  Oxycodone  HCl 10 MG TABS Take 1 tablet (10 mg total) by mouth every 4 (four) hours as needed (for pain). 06/14/15  Yes Ladell Pier, MD  polyethylene glycol [facility] [place]) packet Take 17 g by mouth daily.   Yes Historical Provider, MD  temazepam (RESTORIL) 15 MG capsule Take 1-2 capsules by mouth @ bedtime as needed to help you sleep. 03/29/15  Yes Ladell Pier, MD  ondansetron (ZOFRAN) 8 MG tablet Take 1 tablet (8 mg total) by mouth 2 (two) times daily as needed for nausea (vomiting). Patient not taking: Reported on 06/14/2015 03/29/15   Ladell Pier, MD  potassium chloride (K-DUR) 10 MEQ tablet Take 1 tablet (10 mEq total) by mouth daily. Patient not taking: Reported on 06/14/2015 08/19/14   Ladell Pier, MD  prochlorperazine (COMPAZINE) 10 MG tablet TAKE 1 TABLET BY MOUTH EVERY 6 HOURS AS NEEDED FOR NAUSEA AND VOMITING Patient not taking: Reported on 06/14/2015 04/26/15   Ladell Pier, MD   BP 121/66 mmHg  Pulse 92  Temp(Src) 97.6 F (36.4 C) (Oral)  Resp 18  SpO2 98% Physical Exam  Constitutional: She appears well-developed.  HENT:  Head: Atraumatic.  Eyes: EOM are normal.  Neck: Neck supple.  Cardiovascular:  Tachycardia  Pulmonary/Chest: Effort normal.  Abdominal: She exhibits mass.  Large masses in abdomen. Most of abdomen taken out by these masses.  Musculoskeletal: Normal range of motion.  Skin: Skin is warm.    ED Course  Procedures (including critical care time) Labs Review Labs Reviewed  COMPREHENSIVE METABOLIC PANEL - Abnormal; Notable for the following:    Potassium <2.0 (*)    Chloride 93 (*)    Glucose, Bld 244 (*)    Calcium 8.4 (*)    Total Protein 5.9 (*)    Albumin 2.6 (*)    AST 48 (*)    All other components within normal limits  CBC WITH DIFFERENTIAL/PLATELET - Abnormal; Notable for the following:    RBC 3.71 (*)    Hemoglobin 10.5 (*)    HCT 31.3 (*)    All other components within normal limits  LIPASE, BLOOD  URINALYSIS, ROUTINE W  REFLEX MICROSCOPIC (NOT AT [facility])  MAGNESIUM    Imaging Review No results found. I have personally reviewed and evaluated these images and lab results as part of my medical decision-making.   EKG Interpretation None      MDM   Final diagnoses:  Vomiting  Hypokalemia  Metastatic cancer [facility])    Patient presents with chronic nausea and vomiting. Has known metastatic cancer that is not currently getting treated. Hypokalemic. Will benefit from a palliative care evaluation. Will admit to internal medicine.    Davonna Belling, MD 07/10/15 [phone]

## 2015-07-10 NOTE — ED Notes (Signed)
She tells [place] she h as "stage 4 cancer" and that she has had recent issues with intaking enough food/fluids; and also has been vomiting.  She is cachectic in appearance and is in no distress.

## 2015-07-10 NOTE — ED Notes (Signed)
Lab delay - pt has port and would like that accessed.  

## 2015-07-11 DIAGNOSIS — R112 Nausea with vomiting, unspecified: Secondary | ICD-10-CM

## 2015-07-11 DIAGNOSIS — E876 Hypokalemia: Principal | ICD-10-CM

## 2015-07-11 DIAGNOSIS — C801 Malignant (primary) neoplasm, unspecified: Secondary | ICD-10-CM

## 2015-07-11 DIAGNOSIS — C799 Secondary malignant neoplasm of unspecified site: Secondary | ICD-10-CM | POA: Insufficient documentation

## 2015-07-11 DIAGNOSIS — R1110 Vomiting, unspecified: Secondary | ICD-10-CM | POA: Insufficient documentation

## 2015-07-11 DIAGNOSIS — Z515 Encounter for palliative care: Secondary | ICD-10-CM | POA: Insufficient documentation

## 2015-07-11 DIAGNOSIS — Z7189 Other specified counseling: Secondary | ICD-10-CM | POA: Insufficient documentation

## 2015-07-11 LAB — COMPREHENSIVE METABOLIC PANEL
ALBUMIN: 2 g/dL — AB (ref 3.5–5.0)
ALK PHOS: 55 U/L (ref 38–126)
ALT: 21 U/L (ref 14–54)
ANION GAP: 8 (ref 5–15)
AST: 33 U/L (ref 15–41)
BUN: 15 mg/dL (ref 6–20)
CALCIUM: 7.6 mg/dL — AB (ref 8.9–10.3)
CHLORIDE: 103 mmol/L (ref 101–111)
CO2: 31 mmol/L (ref 22–32)
Creatinine, Ser: 0.68 mg/dL (ref 0.44–1.00)
GFR calc non Af Amer: >60 mL/min (ref >60)
GLUCOSE: 132 mg/dL — AB (ref 65–99)
POTASSIUM: 2.6 mmol/L — AB (ref 3.5–5.1)
SODIUM: 142 mmol/L (ref 135–145)
Total Bilirubin: 0.7 mg/dL (ref 0.3–1.2)
Total Protein: 4.6 g/dL — ABNORMAL LOW (ref 6.5–8.1)

## 2015-07-11 LAB — CBC
HCT: 25.4 % — ABNORMAL LOW (ref 36.0–46.0)
Hemoglobin: 8.5 g/dL — ABNORMAL LOW (ref 12.0–15.0)
MCH: 28.4 pg (ref 26.0–34.0)
MCHC: 33.5 g/dL (ref 30.0–36.0)
MCV: 84.9 fL (ref 78.0–100.0)
PLATELETS: 226 10*3/uL (ref 150–400)
RBC: 2.99 MIL/uL — AB (ref 3.87–5.11)
RDW: 14.8 % (ref 11.5–15.5)
WBC: 5.3 10*3/uL (ref 4.0–10.5)

## 2015-07-11 LAB — GLUCOSE, CAPILLARY
GLUCOSE-CAPILLARY: 129 mg/dL — AB (ref 65–99)
GLUCOSE-CAPILLARY: 143 mg/dL — AB (ref 65–99)
Glucose-Capillary: 101 mg/dL — ABNORMAL HIGH (ref 65–99)
Glucose-Capillary: 108 mg/dL — ABNORMAL HIGH (ref 65–99)

## 2015-07-11 LAB — MAGNESIUM
MAGNESIUM: 1.6 mg/dL — AB (ref 1.7–2.4)
Magnesium: 1.9 mg/dL (ref 1.7–2.4)

## 2015-07-11 LAB — PROTIME-INR
INR: 1.07 (ref 0.00–1.49)
PROTHROMBIN TIME: 14.1 s (ref 11.6–15.2)

## 2015-07-11 LAB — POTASSIUM: Potassium: 4.6 mmol/L (ref 3.5–5.1)

## 2015-07-11 MED ORDER — POTASSIUM CHLORIDE 10 MEQ/100ML IV SOLN
10.0000 meq | INTRAVENOUS | Status: AC
  Administered 2015-07-11 (×2): 10 meq via INTRAVENOUS
  Filled 2015-07-11: qty 100

## 2015-07-11 MED ORDER — POTASSIUM CHLORIDE 20 MEQ/15ML (10%) PO SOLN
40.0000 meq | ORAL | Status: AC
  Administered 2015-07-11 (×3): 40 meq via ORAL
  Filled 2015-07-11 (×3): qty 30

## 2015-07-11 NOTE — Consult Note (Signed)
Consultation Note Date: 07/11/2015   Patient Name: Yolanda Friedman  DOB: May 20, 1951  MRN: [id number]  Age / Sex: 64 y.o., female  PCP: Dionisio Paschal, NP Referring Physician: Florencia Reasons, MD  Reason for Consultation: Establishing goals of care  Life limiting illness: colon cancer.   Clinical Assessment/Narrative: Patient is a 64 year old lady with a past medical history significant for colon cancer, diabetes, hypertension review his history of stroke. Patient sees Dr. Benay Spice from oncology in the outpatient setting. Oncology note from 06-14-15 reviewed. Patient has had gradual progressive weight loss, nausea vomiting, anorexia for the past several weeks. She lives at home with her son and daughter-in-law. Additionally, her sister Yolanda Friedman is her primary caregiver.  She has been admitted to the hospitalist service since 07-10-15 for intractable nausea vomiting, abdominal discomfort. Patient has declined abdominal imaging.   A palliative consultation has been placed for goals of care discussions. Patient is resting in bed. She is awake and alert. She states that she is well aware that she has recurrent progressive: Malignancy. She states she has taken various treatments in the past. She is very clear about not wanting any further chemotherapy or imaging.  CODE STATUS discussions undertaken. DO NOT RESUSCITATE/DO NOT INTUBATE will be established. Additionally, recommended for hospice consult. Patient agreeable to hospice support at home.  Patient's sister Yolanda Friedman arrived at the hospital later. Patient's daughter-in-law also arrived. Asked extensively. With sister and daughter-in-law outside the room. They are tearful but states that the patient is a very independent person who values her privacy and dignity. They state they are aware of her wishes to not undergo any further treatments/imaging. They agree that comfort or to  be the singular goal at this point in time  Contacts/Participants in Discussion: Patient, then her sister, her daughter-in-law Primary Decision Maker:    Patient herself who is currently decisional, then she elects her sister Yolanda Friedman at [phone] Relationship to Patient sister HCPOA: yes    SUMMARY OF RECOMMENDATIONS  Plan: DNR/DNI Hospice consult Home with hospice once arrangements complete  Code Status/Advance Care Planning: DNR    Code Status Orders        Start     Ordered   07/11/15 1200  Do not attempt resuscitation (DNR)   Continuous    Question Answer Comment  In the event of cardiac or respiratory ARREST Do not call a "code blue"   In the event of cardiac or respiratory ARREST Do not perform Intubation, CPR, defibrillation or ACLS   In the event of cardiac or respiratory ARREST Use medication by any route, position, wound care, and other measures to relive pain and suffering. May use oxygen, suction and manual treatment of airway obstruction as needed for comfort.      07/11/15 1159      Other Directives:Other  Symptom Management:    As above  Palliative Prophylaxis:   Bowel Regimen  Additional Recommendations (Limitations, Scope, Preferences):  Full Comfort Care  Psycho-social/Spiritual:  Support System: Strong Desire for further Chaplaincy support:yes Additional Recommendations: Education on Hospice  Prognosis: < 6 months  Discharge Planning: Home with Hospice   Chief Complaint/ Primary Diagnoses: Present on Admission:  . Hypokalemia  I have reviewed the medical record, interviewed the patient and family, and examined the patient. The following aspects are pertinent.  Past Medical History  Diagnosis Date  . Hypertension   . Cancer ([facility])   . Colon cancer [facility]) 2005    metastatic 2012  . Metastatic recurrent adenocarcinoma  of colon 03/13/2011  . Diabetes mellitus   . HTN (hypertension) 12/22/2011  . Shortness of breath   . GERD  (gastroesophageal reflux disease)   . Anxiety   . Normocytic normochromic anemia 06/26/2013   Social History   Social History  . Marital Status: Single    Spouse Name: N/A  . Number of Children: N/A  . Years of Education: N/A   Social History Main Topics  . Smoking status: Never Smoker   . Smokeless tobacco: Never Used  . Alcohol Use: Yes     Comment: occassional  . Drug Use: Yes    Special: Marijuana, Solvent inhalants     Comment: smokes 2 joints daily  . Sexual Activity: Yes    Birth Control/ Protection: Surgical   Other Topics Concern  . None   Social History Narrative   Family History  Problem Relation Age of Onset  . Cancer Mother   . Hypertension Sister   . Diabetes Sister   . Hypertension Maternal Uncle    Scheduled Meds: . sodium chloride   Intravenous STAT  . feeding supplement (ENSURE ENLIVE)  237 mL Oral BID BM  . insulin aspart  0-9 Units Subcutaneous TID WC  . morphine  30 mg Oral Daily  . pantoprazole (PROTONIX) IV  40 mg Intravenous Q24H  . polyethylene glycol  17 g Oral Daily  . potassium chloride  40 mEq Oral Q4H   Continuous Infusions: . 0.9 % NaCl with KCl 20 mEq / L 100 mL/hr at 07/10/15 1803   PRN Meds:.albuterol, morphine injection, oxyCODONE, promethazine, sodium chloride, temazepam Medications Prior to Admission:  Prior to Admission medications   Medication Sig Start Date End Date Taking? Authorizing Provider  albuterol (PROVENTIL HFA;VENTOLIN HFA) 108 (90 BASE) MCG/ACT inhaler Inhale 2 puffs into the lungs every 6 (six) hours as needed for wheezing. 05/19/14  Yes Baird Cancer, PA-C  aspirin 325 MG tablet Take 325 mg by mouth daily.  06/27/13  Yes Kathie Dike, MD  ferrous fumarate (HEMOCYTE) 325 (106 FE) MG TABS tablet Take 1 tablet (106 mg of iron total) by mouth 2 (two) times daily. Patient taking differently: Take 1 tablet by mouth daily.  08/19/14  Yes Ladell Pier, MD  metFORMIN (GLUCOPHAGE) 500 MG tablet Take 1 tablet (500  mg total) by mouth daily with breakfast. 03/24/14  Yes Baird Cancer, PA-C  morphine (MS CONTIN) 30 MG 12 hr tablet Take 1 tablet (30 mg total) by mouth daily. 06/04/15  Yes Ladell Pier, MD  Multiple Vitamins-Minerals (ONE-A-DAY 50 PLUS PO) Take 1 tablet by mouth daily.    Yes Historical Provider, MD  omeprazole (PRILOSEC) 20 MG capsule Take 1 capsule (20 mg total) by mouth 2 (two) times daily. 10/02/14  Yes Ladell Pier, MD  Oxycodone HCl 10 MG TABS Take 1 tablet (10 mg total) by mouth every 4 (four) hours as needed (for pain). 06/14/15  Yes Ladell Pier, MD  polyethylene glycol [facility]) packet Take 17 g by mouth daily.   Yes Historical Provider, MD  temazepam (RESTORIL) 15 MG capsule Take 1-2 capsules by mouth @ bedtime as needed to help you sleep. 03/29/15  Yes Ladell Pier, MD  ondansetron (ZOFRAN) 8 MG tablet Take 1 tablet (8 mg total) by mouth 2 (two) times daily as needed for nausea (vomiting). Patient not taking: Reported on 06/14/2015 03/29/15   Ladell Pier, MD  potassium chloride (K-DUR) 10 MEQ tablet Take 1 tablet (10  mEq total) by mouth daily. Patient not taking: Reported on 06/14/2015 08/19/14   Ladell Pier, MD  prochlorperazine (COMPAZINE) 10 MG tablet TAKE 1 TABLET BY MOUTH EVERY 6 HOURS AS NEEDED FOR NAUSEA AND VOMITING Patient not taking: Reported on 06/14/2015 04/26/15   Ladell Pier, MD   No Known Allergies CBC:    Component Value Date/Time   WBC 5.3 07/11/2015 0400   WBC 5.5 08/13/2014 1105   HGB 8.5* 07/11/2015 0400   HGB 11.5* 08/13/2014 1105   HCT 25.4* 07/11/2015 0400   HCT 35.5 08/13/2014 1105   PLT 226 07/11/2015 0400   PLT 205 08/13/2014 1105   MCV 84.9 07/11/2015 0400   MCV 89.9 08/13/2014 1105   NEUTROABS 4.8 07/10/2015 1340   NEUTROABS 3.1 08/13/2014 1105   LYMPHSABS 1.6 07/10/2015 1340   LYMPHSABS 1.9 08/13/2014 1105   MONOABS 0.6 07/10/2015 1340   MONOABS 0.4 08/13/2014 1105   EOSABS 0.1 07/10/2015 1340   EOSABS 0.1  08/13/2014 1105   BASOSABS 0.0 07/10/2015 1340   BASOSABS 0.0 08/13/2014 1105   Comprehensive Metabolic Panel:    Component Value Date/Time   NA 142 07/11/2015 0400   K 2.6* 07/11/2015 0400   CL 103 07/11/2015 0400   CO2 31 07/11/2015 0400   BUN 15 07/11/2015 0400   CREATININE 0.68 07/11/2015 0400   GLUCOSE 132* 07/11/2015 0400   CALCIUM 7.6* 07/11/2015 0400   AST 33 07/11/2015 0400   ALT 21 07/11/2015 0400   ALKPHOS 55 07/11/2015 0400   BILITOT 0.7 07/11/2015 0400   PROT 4.6* 07/11/2015 0400   ALBUMIN 2.0* 07/11/2015 0400    Review of Systems Positive for nausea posterior for abdominal discomfort Patient denies constipation posterior for weight loss Physical Exam Frail elderly appearing African-American lady resting in bed Lungs clear to auscultation S1-S2 Abdomen soft nontender Extremities no edema Nonfocal Vital Signs: BP 114/70 mmHg  Pulse 78  Temp(Src) 98 F (36.7 C) (Oral)  Resp 18  Ht 5' (1.524 m)  Wt 49.533 kg (109 lb 3.2 oz)  BMI 21.33 kg/m2  SpO2 100% SpO2: Last BM Date: 07/11/15  O2 Device:SpO2: 100 % O2 Flow Rate: .  Intake/output summary:  Intake/Output Summary (Last 24 hours) at 07/11/15 1202 Last data filed at 07/11/15 1000  Gross per 24 hour  Intake    240 ml  Output    100 ml  Net    140 ml   LBM:  BMP Latest Ref Rng 07/11/2015 07/10/2015 05/19/2014  Glucose 65 - 99 mg/dL 132(H) 244(H) 102(H)  BUN 6 - 20 mg/dL 15 19 14   Creatinine 0.44 - 1.00 mg/dL 0.68 0.83 0.66  Sodium 135 - 145 mmol/L 142 139 143  Potassium 3.5 - 5.1 mmol/L 2.6(LL) <2.0(LL) 4.0  Chloride 101 - 111 mmol/L 103 93(L) 105  CO2 22 - 32 mmol/L 31 32 28  Calcium 8.9 - 10.3 mg/dL 7.6(L) 8.4(L) 9.6    Baseline Weight: Weight: 49.533 kg (109 lb 3.2 oz) Most recent weight: Weight: 49.533 kg (109 lb 3.2 oz)      Palliative Assessment/Data:   PPS 30% Additional Data Reviewed: Recent Labs     07/10/15  1340  07/11/15  0400  WBC  7.0  5.3  HGB  10.5*  8.5*  PLT  271  226   NA  139  142  BUN  19  15  CREATININE  0.83  0.68    Time In:  0900 Time Out: 1000 Time Total: 60  Greater than 50%  of this time was spent counseling and coordinating care related to the above assessment and plan.  Signed by: Loistine Chance, MD [id number] Loistine Chance, MD  07/11/2015, 12:02 PM  Please contact Palliative Medicine Team phone at [phone] for questions and concerns.

## 2015-07-11 NOTE — Care Management Note (Signed)
Case Management Note  Patient Details  Name: Yolanda Friedman MRN: [id number] Date of Birth: [birth year]  Subjective/Objective:                  Weakness, cancer  Action/Plan: CM spoke with the patient and her sister at the bedside. Patient plans to go home with hospice. Provided with a list of Hospice Providers. Patient will notify her nurse when she has selected a provider. Will continue to follow.   Expected Discharge Date:                  Expected Discharge Plan:  Home w Hospice Care  In-House Referral:     Discharge planning Services  CM Consult  Post Acute Care Choice:  Hospice Choice offered to:  Patient  DME Arranged:    DME Agency:     HH Arranged:    [place] Agency:     Status of Service:  In process, will continue to follow  Medicare Important Message Given:    Date Medicare IM Given:    Medicare IM give by:    Date Additional Medicare IM Given:    Additional Medicare Important Message give by:     If discussed at [place] of Stay Meetings, dates discussed:    Additional Comments:  Apolonio Schneiders, RN 07/11/2015, 1:32 PM

## 2015-07-11 NOTE — Progress Notes (Signed)
PROGRESS NOTE  Yolanda Friedman BSJ:[id number] DOB: 02/18/1951 DOA: 07/10/2015 PCP: Dionisio Paschal, NP  HPI/Recap of past 24 hours:   Feeling better, in good spirit, denies pain, no n/v,  family in room. Potassium remain critical low, mag better, continue replace, recheck lab this pm  Assessment/Plan: Active Problems:   Hypokalemia  Hypokalemia: k 2.6 this am, continue  aggressive replacement,   Hypomagnesemia: replace mag  N/v/dehydration: ivf, antiemetics, better, advance diet to soft diet  Cancer pain, continue home pain meds, add iv prn analgesics  Severe malnutrition due to chronic illness, currently not tolerating po, goal of care discussion, continue ivf.  Metastatic colon cancer, FTT, patient does not want further cancer treatment, want to be comfortable, agreed to palliative care consult.  noninsulin dependent diabetes: hold metformin, start ssi. a1c pending.  DVT prophylaxis: scd's  Consultants: palliative care  Code Status: full , discussed with family and patient  Family Communication: Patient and sister and daughter in law, sister Morey Hummingbird who came from [place] is the preferred Contact person, [phone]  Disposition Plan: pending palliative care consult    Procedures:  none  Antibiotics:  none   Objective: BP 114/70 mmHg  Pulse 78  Temp(Src) 98 F (36.7 C) (Oral)  Resp 18  Ht 5' (1.524 m)  Wt 109 lb 3.2 oz (49.533 kg)  BMI 21.33 kg/m2  SpO2 100%  Intake/Output Summary (Last 24 hours) at 07/11/15 0813 Last data filed at 07/11/15 0631  Gross per 24 hour  Intake    240 ml  Output      0 ml  Net    240 ml   Filed Weights   07/10/15 1634  Weight: 109 lb 3.2 oz (49.533 kg)    Exam:  General: Frail and emaciated, but NAD  Eyes: PERRL  ENT: dry oral mucosa  Neck: supple, no JVD  Cardiovascular: RRR  Respiratory: CTABL  Abdomen: old healed midline surgical scar, palpable multiple masses, positive bowel  sounds  Skin: no rash  Musculoskeletal: No edema  Psychiatric: calm/cooperative  Neurologic: no focal findings   Data Reviewed: Basic Metabolic Panel:  Recent Labs Lab 07/10/15 1340 07/10/15 1342 07/11/15 0400  NA 139  --  142  K <2.0*  --  2.6*  CL 93*  --  103  CO2 32  --  31  GLUCOSE 244*  --  132*  BUN 19  --  15  CREATININE 0.83  --  0.68  CALCIUM 8.4*  --  7.6*  MG  --  1.4* 1.9   Liver Function Tests:  Recent Labs Lab 07/10/15 1340 07/11/15 0400  AST 48* 33  ALT 26 21  ALKPHOS 70 55  BILITOT 0.9 0.7  PROT 5.9* 4.6*  ALBUMIN 2.6* 2.0*    Recent Labs Lab 07/10/15 1340  LIPASE 33   No results for input(s): AMMONIA in the last 168 hours. CBC:  Recent Labs Lab 07/10/15 1340 07/11/15 0400  WBC 7.0 5.3  NEUTROABS 4.8  --   HGB 10.5* 8.5*  HCT 31.3* 25.4*  MCV 84.4 84.9  PLT 271 226   Cardiac Enzymes:   No results for input(s): CKTOTAL, CKMB, CKMBINDEX, TROPONINI in the last 168 hours. BNP (last 3 results) No results for input(s): BNP in the last 8760 hours.  ProBNP (last 3 results) No results for input(s): PROBNP in the last 8760 hours.  CBG:  Recent Labs Lab 07/10/15 2110 07/11/15 0752  GLUCAP 205* 129*    No results found for  this or any previous visit (from the past 240 hour(s)).   Studies: No results found.  Scheduled Meds: . sodium chloride   Intravenous STAT  . feeding supplement (ENSURE ENLIVE)  237 mL Oral BID BM  . insulin aspart  0-9 Units Subcutaneous TID WC  . morphine  30 mg Oral Daily  . pantoprazole (PROTONIX) IV  40 mg Intravenous Q24H  . polyethylene glycol  17 g Oral Daily  . potassium chloride  10 mEq Intravenous Q1 Hr x 2  . potassium chloride  40 mEq Oral Q4H    Continuous Infusions: . 0.9 % NaCl with KCl 20 mEq / L 100 mL/hr at 07/10/15 1803     Time spent: 27mins  Jilliam Bellmore MD, PhD  [facility] Pager [phone]. If 7PM-7AM, please contact night-coverage at [email or web address], password  [facility] 07/11/2015, 8:13 AM  LOS: 1 day

## 2015-07-11 NOTE — Progress Notes (Signed)
Collected: 07/11/15 0400    Resulting lab: SUNQUEST   Reference range: 3.5 - 5.1 mmol/L   Value: 2.6 (Low Panic)   Comment: CRITICAL RESULT CALLED TO, READ BACK BY AND VERIFIED WITH:  PICKET,S RN [phone] COVINGTON,N  DELTA CHECK NOTED  REPEATED TO VERIFY   MD notified and orders received. SRP, RN

## 2015-07-12 LAB — BASIC METABOLIC PANEL
ANION GAP: 7 (ref 5–15)
BUN: 11 mg/dL (ref 6–20)
CALCIUM: 8.1 mg/dL — AB (ref 8.9–10.3)
CHLORIDE: 112 mmol/L — AB (ref 101–111)
CO2: 25 mmol/L (ref 22–32)
Creatinine, Ser: 0.64 mg/dL (ref 0.44–1.00)
GFR calc non Af Amer: >60 mL/min (ref >60)
Glucose, Bld: 92 mg/dL (ref 65–99)
Potassium: 4.6 mmol/L (ref 3.5–5.1)
SODIUM: 144 mmol/L (ref 135–145)

## 2015-07-12 LAB — GLUCOSE, CAPILLARY
Glucose-Capillary: 97 mg/dL (ref 65–99)
Glucose-Capillary: 192 mg/dL — ABNORMAL HIGH (ref 65–99)

## 2015-07-12 LAB — HEMOGLOBIN A1C
Hgb A1c MFr Bld: 8.1 % — ABNORMAL HIGH (ref 4.8–5.6)
MEAN PLASMA GLUCOSE: 186 mg/dL

## 2015-07-12 LAB — MAGNESIUM: MAGNESIUM: 1.5 mg/dL — AB (ref 1.7–2.4)

## 2015-07-12 MED ORDER — HEPARIN SOD (PORK) LOCK FLUSH 100 UNIT/ML IV SOLN
500.0000 [IU] | INTRAVENOUS | Status: AC | PRN
Start: 2015-07-12 — End: 2015-07-12
  Administered 2015-07-12: 500 [IU]

## 2015-07-12 MED ORDER — OXYCODONE HCL 10 MG PO TABS: 10.0000 mg | ORAL_TABLET | ORAL | Status: AC | PRN

## 2015-07-12 MED ORDER — SODIUM CHLORIDE 0.9 % IV SOLN
INTRAVENOUS | Status: DC
Start: 2015-07-12 — End: 2015-07-12
  Administered 2015-07-12: 02:00:00 via INTRAVENOUS

## 2015-07-12 MED ORDER — PANTOPRAZOLE SODIUM 40 MG PO TBEC: 40.0000 mg | DELAYED_RELEASE_TABLET | Freq: Every day | ORAL | Status: DC

## 2015-07-12 MED ORDER — MORPHINE SULFATE ER 30 MG PO TBCR: 30.0000 mg | EXTENDED_RELEASE_TABLET | Freq: Every day | ORAL | Status: AC

## 2015-07-12 MED ORDER — MAGNESIUM SULFATE 2 GM/50ML IV SOLN
2.0000 g | Freq: Once | INTRAVENOUS | Status: AC
  Administered 2015-07-12: 2 g via INTRAVENOUS
  Filled 2015-07-12: qty 50

## 2015-07-12 MED ORDER — ENSURE ENLIVE PO LIQD: 237.0000 mL | Freq: Two times a day (BID) | ORAL | Status: AC

## 2015-07-12 NOTE — Progress Notes (Signed)
Notified by [facility] of pt./family request for Hospice and [place] at home after discharge. Writer spoke with the patient and her dtr. at the bedside to initiate eduation related to hospice philosophy, services and team approach to care. Family voiced understanding of information provided. Per discussion plan is for discharge to home by private vehicle later today.  Please send signed and completed DNR form home with pt./family. Patient will need prescriptions for discharge comfort medications.  DME needs discussed and pt. would like at 3 in 1 for her commode. [facility] equipment manager Jewel Ysidro Evert notified and will contact [place] to arrange delivery to the home for tomorrow. The  home address has been verified and Prescott Parma is the contact person to call to arrange time of delivery.  [facility] aware of above. [facility] contact numbers have been given to [facility] during the visit. Above information shared with Juliann Pulse, [facility]. Please call with any questions.   [place] Liaison [phone]

## 2015-07-12 NOTE — Care Management Note (Signed)
Case Management Note  Patient Details  Name: MATASHA SMIGELSKI MRN: [id number] Date of Birth: 1951-05-08  Subjective/Objective:    Patient/dtr chose [facility] has accepted patient. Will make home visit tomorrow.  Patient/dtr agree to d/c plan.No futher d/c needs.script in shadow chart.Dtr able to transp home. Nurse updated.                Action/Plan:d/c home w/[facility].   Expected Discharge Date:                  Expected Discharge Plan:  Home w Hospice Care  In-House Referral:     Discharge planning Services  CM Consult  Post Acute Care Choice:  Hospice Choice offered to:  Patient  DME Arranged:    DME Agency:     HH Arranged:  RN [place] Agency:  [facility] [place]  Status of Service:  Completed, signed off  Medicare Important Message Given:    Date Medicare IM Given:    Medicare IM give by:    Date Additional Medicare IM Given:    Additional Medicare Important Message give by:     If discussed at [place] of Stay Meetings, dates discussed:    Additional Comments:  Dessa Phi, RN 07/12/2015, 12:58 PM

## 2015-07-12 NOTE — Progress Notes (Signed)
Key Points: Use following P&T approved IV to PO antibiotic change policy.  Description contains the criteria that are approved Note: Policy Excludes:  Esophagectomy patientsPHARMACIST - PHYSICIAN COMMUNICATION DR:   Erlinda Hong CONCERNING: IV to Oral Route Change Policy  RECOMMENDATION: This patient is receiving protonix by the intravenous route.  Based on criteria approved by the Pharmacy and Therapeutics Committee, the intravenous medication(s) is/are being converted to the equivalent oral dose form(s).   DESCRIPTION: These criteria include:  The patient is eating (either orally or via tube) and/or has been taking other orally administered medications for a least 24 hours  The patient has no evidence of active gastrointestinal bleeding or impaired GI absorption (gastrectomy, short bowel, patient on TNA or NPO).  If you have questions about this conversion, please contact the Pharmacy Department  []   [phone] )  [facility] []   [phone] )  [facility] []   [phone] )  [facility] []   [phone] )  [facility] [x]   [phone] )  [place], [facility] 07/12/2015 1:57 PM

## 2015-07-12 NOTE — Progress Notes (Addendum)
Nutrition Brief Note  Patient identified on the Malnutrition Screening Tool (MST) Report  Wt Readings from Last 15 Encounters:  07/10/15 109 lb 3.2 oz (49.533 kg)  06/14/15 121 lb 1.6 oz (54.931 kg)  05/06/15 130 lb 8 oz (59.194 kg)  03/29/15 138 lb 3.2 oz (62.687 kg)  02/15/15 159 lb 9.6 oz (72.394 kg)  01/22/15 163 lb 4.8 oz (74.072 kg)  01/07/15 160 lb 12.8 oz (72.938 kg)  11/12/14 175 lb 12.8 oz (79.742 kg)  09/24/14 180 lb 1.6 oz (81.693 kg)  08/13/14 180 lb 14.4 oz (82.056 kg)  07/02/14 185 lb 8 oz (84.142 kg)  05/19/14 182 lb (82.555 kg)  03/24/14 174 lb 12.8 oz (79.289 kg)  01/20/14 174 lb (78.926 kg)  11/06/13 176 lb (79.833 kg)    Body mass index is 21.33 kg/(m^2). Patient meets criteria for normal weight based on current BMI.   Current diet order is Soft. Labs and medications reviewed.   Pt seen for MST. Per palliative care notes, pt to d/c home with hospice when arrangements made. Pt drowsy this AM and needing to use the restroom. She denies any nutritional needs at this time. Ensure Jeanne Ivan has already been ordered BID.   Per weight hx review, pt has lost 51 lbs (32% body weight) in the past 6 months which is significant for time frame. Severe muscle wasting also visualized to upper body. Pt meets criteria for severe malnutrition in the context of chronic illness.      Jarome Matin, RD, LDN Inpatient Clinical Dietitian Pager # [phone] After hours/weekend pager # [phone]

## 2015-07-12 NOTE — Discharge Summary (Signed)
Discharge Summary  Yolanda Friedman SEG:[id number] DOB: Jul 11, 1951  PCP: Dionisio Paschal, NP  Admit date: 07/10/2015 Discharge date: 07/12/2015  Time spent: <47mins  Recommendations for Outpatient Follow-up:  1. F/u with home hospice  Discharge Diagnoses:  Active Hospital Problems   Diagnosis Date Noted  . Metastatic cancer ([place])   . Vomiting   . Encounter for palliative care   . Goals of care, counseling/discussion   . Hypokalemia 07/10/2015    Resolved Hospital Problems   Diagnosis Date Noted Date Resolved  No resolved problems to display.    Discharge Condition: stable  Diet recommendation: heart healthy/carb modified  Filed Weights   07/10/15 1634  Weight: 109 lb 3.2 oz (49.533 kg)    History of present illness:  With metastatic colon cancer has decided doe not want to have further therapy for cancer, "just want to be comfortable" presented to the ED due to n/v, not eating, dehydration and progressive weakness, upon presenting to the ED, she was found to have sinus tachycardia, but bp stable, labs showed critically low potassium, k less than 2, she was given hydration , potassium supplement started, hospitalist called to admit the patient. Patient is very frail and emaciated, but still able to ambulate without assistance, she reported her cancer pain is under control with her current home regimen, she has decided not getting more cancer therapy, she has become progressively weak and continue to loose weight, very poor appetite, more n/v, has not been eating or drinking well, since last night she has not been eating or drinking any thing due to afraid she is going to vomit. She denies fever, no chest pain, no edema. No constipation or diarrhea. She has refused imaging that was offered in the ED  Hospital Course:  Active Problems:   Hypokalemia   Metastatic cancer ([facility])   Vomiting   Encounter for palliative care   Goals of care, counseling/discussion Hypokalemia: initially  critically low not able to detect due to less than 2, s/p  aggressive replacement, k4.6 at discharge.  Hypomagnesemia:  mag replaced by IV  N/v/dehydration: ivf, antiemetics, better, advance diet to soft diet  Cancer pain, continue home pain meds, add iv prn analgesics, well controlled at discharge.  Severe malnutrition due to chronic illness, currently not tolerating po, goal of care discussion, recieved ivf.  Metastatic colon cancer, FTT, patient does not want further cancer treatment, want to be comfortable, agreed to palliative care consult.  noninsulin dependent diabetes: a1c 8.1, on ssi in the hospital, metformin held in the hospital, did not restart at discharge due to inconsistent oral intake and on home hospice.    Consultants: palliative care  Code Status: DNR  Family Communication: Patient and sister and daughter in law, sister Morey Hummingbird who came from [place] is the preferred Contact person, [phone]  Disposition Plan: discharge to home hospice on 11/7    Procedures:  none  Antibiotics:  none   Discharge Exam: BP 115/67 mmHg  Pulse 87  Temp(Src) 97.8 F (36.6 C) (Oral)  Resp 18  Ht 5' (1.524 m)  Wt 109 lb 3.2 oz (49.533 kg)  BMI 21.33 kg/m2  SpO2 100%   General: Frail and emaciated, but NAD  Eyes: PERRL  ENT: dry oral mucosa  Neck: supple, no JVD  Cardiovascular: RRR  Respiratory: CTABL  Abdomen: old healed midline surgical scar, palpable multiple masses, positive bowel sounds  Skin: no rash  Musculoskeletal: No edema  Psychiatric: calm/cooperative  Neurologic: no focal findings  Discharge Instructions You were cared for by a hospitalist during your hospital stay. If you have any questions about your discharge medications or the care you received while you were in the hospital after you are discharged, you can call the unit and asked to speak with the hospitalist on call if the hospitalist that took care of you is  not available. Once you are discharged, your primary care physician will handle any further medical issues. Please note that NO REFILLS for any discharge medications will be authorized once you are discharged, as it is imperative that you return to your primary care physician (or establish a relationship with a primary care physician if you do not have one) for your aftercare needs so that they can reassess your need for medications and monitor your lab values.  Discharge Instructions    Diet - low sodium heart healthy    Complete by:  As directed   Carb modified     Increase activity slowly    Complete by:  As directed             Medication List    STOP taking these medications        aspirin 325 MG tablet     ferrous fumarate 325 (106 FE) MG Tabs tablet  Commonly known as:  HEMOCYTE     metFORMIN 500 MG tablet  Commonly known as:  GLUCOPHAGE      TAKE these medications        albuterol 108 (90 BASE) MCG/ACT inhaler  Commonly known as:  PROVENTIL HFA;VENTOLIN HFA  Inhale 2 puffs into the lungs every 6 (six) hours as needed for wheezing.     feeding supplement (ENSURE ENLIVE) Liqd  Take 237 mLs by mouth 2 (two) times daily between meals.     morphine 30 MG 12 hr tablet  Commonly known as:  MS CONTIN  Take 1 tablet (30 mg total) by mouth daily.     omeprazole 20 MG capsule  Commonly known as:  PRILOSEC  Take 1 capsule (20 mg total) by mouth 2 (two) times daily.     ondansetron 8 MG tablet  Commonly known as:  ZOFRAN  Take 1 tablet (8 mg total) by mouth 2 (two) times daily as needed for nausea (vomiting).     ONE-A-DAY 50 PLUS PO  Take 1 tablet by mouth daily.     Oxycodone HCl 10 MG Tabs  Take 1 tablet (10 mg total) by mouth every 4 (four) hours as needed (for pain).     polyethylene glycol packet  Commonly known as:  MIRALAX / GLYCOLAX  Take 17 g by mouth daily.     potassium chloride 10 MEQ tablet  Commonly known as:  K-DUR  Take 1 tablet (10 mEq total) by  mouth daily.     prochlorperazine 10 MG tablet  Commonly known as:  COMPAZINE  TAKE 1 TABLET BY MOUTH EVERY 6 HOURS AS NEEDED FOR NAUSEA AND VOMITING     temazepam 15 MG capsule  Commonly known as:  RESTORIL  Take 1-2 capsules by mouth @ bedtime as needed to help you sleep.       No Known Allergies    The results of significant diagnostics from this hospitalization (including imaging, microbiology, ancillary and laboratory) are listed below for reference.    Significant Diagnostic Studies: No results found.  Microbiology: No results found for this or any previous visit (from the past 240 hour(s)).   Labs: Basic Metabolic Panel:  Recent Labs Lab 07/10/15 1340 07/10/15 1342 07/11/15 0400 07/11/15 1900 07/12/15 0456  NA 139  --  142  --  144  K <2.0*  --  2.6* 4.6 4.6  CL 93*  --  103  --  112*  CO2 32  --  31  --  25  GLUCOSE 244*  --  132*  --  92  BUN 19  --  15  --  11  CREATININE 0.83  --  0.68  --  0.64  CALCIUM 8.4*  --  7.6*  --  8.1*  MG  --  1.4* 1.9 1.6* 1.5*   Liver Function Tests:  Recent Labs Lab 07/10/15 1340 07/11/15 0400  AST 48* 33  ALT 26 21  ALKPHOS 70 55  BILITOT 0.9 0.7  PROT 5.9* 4.6*  ALBUMIN 2.6* 2.0*    Recent Labs Lab 07/10/15 1340  LIPASE 33   No results for input(s): AMMONIA in the last 168 hours. CBC:  Recent Labs Lab 07/10/15 1340 07/11/15 0400  WBC 7.0 5.3  NEUTROABS 4.8  --   HGB 10.5* 8.5*  HCT 31.3* 25.4*  MCV 84.4 84.9  PLT 271 226   Cardiac Enzymes: No results for input(s): CKTOTAL, CKMB, CKMBINDEX, TROPONINI in the last 168 hours. BNP: BNP (last 3 results) No results for input(s): BNP in the last 8760 hours.  ProBNP (last 3 results) No results for input(s): PROBNP in the last 8760 hours.  CBG:  Recent Labs Lab 07/11/15 1151 07/11/15 1724 07/11/15 2014 07/12/15 0727 07/12/15 1143  GLUCAP 143* 101* 108* 97 192*       Signed:  Kelicia Youtz MD, PhD  [facility] 07/12/2015, 11:54  AM

## 2015-07-12 NOTE — Progress Notes (Signed)
Daily Progress Note   Patient Name: Yolanda Friedman       Date: 07/12/2015 DOB: October 17, 1950  Age: 64 y.o. MRN#: [id number] Attending Physician: Florencia Reasons, MD Primary Care Physician: Dionisio Paschal, NP Admit Date: 07/10/2015  Reason for Consultation/Follow-up: Establishing goals of care  Life limiting illness: colon cancer.   Subjective: Resting in bed, states she feels sleepy abd pain well controlled, no nausea no vomiting  Interval Events: Ok to go home with hospice when arrangements completed Case management to follow up with family, discuss which hospice agency the patient's family has selected and complete the initial hospice consultation K repleted DNR DNI  Medications: Agree with continuing compazine/zofran for nausea vomiting PRN at home Agree with current pain regimen: MS Contin/PRN Oxy IR at home Miralax for bowel regimen  Length of Stay: 2 days  Current Medications: Scheduled Meds:  . feeding supplement (ENSURE ENLIVE)  237 mL Oral BID BM  . insulin aspart  0-9 Units Subcutaneous TID WC  . morphine  30 mg Oral Daily  . pantoprazole (PROTONIX) IV  40 mg Intravenous Q24H  . polyethylene glycol  17 g Oral Daily    Continuous Infusions: . sodium chloride 75 mL/hr at 07/12/15 0132    PRN Meds: albuterol, morphine injection, oxyCODONE, promethazine, sodium chloride, temazepam  Physical Exam: Physical Exam             NAD frail AA lady Clear S1 S2 Abdomen mild distension mild generalized tenderness No edema Non focal  Vital Signs: BP 115/67 mmHg  Pulse 87  Temp(Src) 97.8 F (36.6 C) (Oral)  Resp 18  Ht 5' (1.524 m)  Wt 49.533 kg (109 lb 3.2 oz)  BMI 21.33 kg/m2  SpO2 100% SpO2: SpO2: 100 % O2 Device: O2 Device: Not Delivered O2 Flow Rate:     Intake/output summary:  Intake/Output Summary (Last 24 hours) at 07/12/15 1003 Last data filed at 07/11/15 1800  Gross per 24 hour  Intake   1100 ml  Output      0 ml  Net   1100 ml   LBM:   Baseline Weight: Weight: 49.533 kg (109 lb 3.2 oz) Most recent weight: Weight: 49.533 kg (109 lb 3.2 oz)       Palliative Assessment/Data: Flowsheet Rows        Most  Recent Value   Intake Tab    Referral Department  Hospitalist   Unit at Time of Referral  Cardiac/Telemetry Unit   Palliative Care Primary Diagnosis  Cancer   Date Notified  07/10/15   Palliative Care Type  New Palliative care   Reason for referral  Clarify Goals of Care   Date of Admission  07/10/15   Date first seen by Palliative Care  07/11/15   # of days Palliative referral response time  1 Day(s)   # of days IP prior to Palliative referral  0   Clinical Assessment    Psychosocial & Spiritual Assessment    Palliative Care Outcomes       Additional Data Reviewed: Recent Labs     07/10/15  1340  07/11/15  0400  07/12/15  0456  WBC  7.0  5.3   --   HGB  10.5*  8.5*   --   PLT  271  226   --   NA  139  142  144  BUN  19  15  11   CREATININE  0.83  0.68  0.64     Problem List:  Patient Active Problem List   Diagnosis Date Noted  . Metastatic cancer ([place])   . Vomiting   . Encounter for palliative care   . Goals of care, counseling/discussion   . Hypokalemia 07/10/2015  . Cancer associated pain 01/23/2015  . Diabetes mellitus ([place]) 07/02/2014  . CVA (cerebral infarction) 06/26/2013  . Normocytic normochromic anemia 06/26/2013  . HTN (hypertension) 12/22/2011  . Metastatic recurrent adenocarcinoma of colon 03/13/2011     Palliative Care Assessment & Plan    1.Code Status:  DNR    Code Status Orders        Start     Ordered   07/11/15 1200  Do not attempt resuscitation (DNR)   Continuous    Question Answer Comment  In the event of cardiac or respiratory ARREST Do not call a "code blue"    In the event of cardiac or respiratory ARREST Do not perform Intubation, CPR, defibrillation or ACLS   In the event of cardiac or respiratory ARREST Use medication by any route, position, wound care, and other measures to relive pain and suffering. May use oxygen, suction and manual treatment of airway obstruction as needed for comfort.      07/11/15 1159       2. Goals of Care:  Home with hospice   Limitations on Scope of Treatment: Full Comfort Care  Desire for further Chaplaincy support:no  Psycho-social Needs: Education on Hospice  3. Symptom Management:        Agree with continuing compazine/zofran for nausea vomiting PRN at home Agree with current pain regimen: MS Contin/PRN Oxy IR at home Miralax for bowel regimen   4. Palliative Prophylaxis:   Bowel Regimen  5. Prognosis: < 6 months  6. Discharge Planning:  Home with Hospice   Care plan was discussed with  Patient, daughter in law, Dr Erlinda Hong  Thank you for allowing the Palliative Medicine Team to assist in the care of this patient.   Time In: 0930 Time Out: 10:00 Total Time 30 Prolonged Time Billed  no        [phone] Loistine Chance, MD  07/12/2015, 10:03 AM  Please contact Palliative Medicine Team phone at [phone] for questions and concerns.

## 2015-07-12 NOTE — Progress Notes (Signed)
Completed D/C teaching with patient and family. Answered all questions. Per case manager, Juliann Pulse, and hospice representative, patient hospice is set up for home. Patient will be D/C home with family and hospice care in stable condition.  Cathie Hoops, RN

## 2015-07-12 NOTE — Care Management Note (Signed)
Case Management Note  Patient Details  Name: HIYA POINT MRN: [id number] Date of Birth: July 21, 1951  Subjective/Objective: Spoke to patient/dtr Sherron Flemings in rm about choice for home hospice-[facility] chosen. [facility] tel#[phone]-[place]-informed of referral, & contact person dtr w/tel# [phone]. They will contact [facility] liason to eval for [facility] services.  PCP who will follow-Dr. Leroy Libman.No dme needed. Dtr will transp home.                  Action/Plan:d/c plan home. Await acceptance from [place].   Expected Discharge Date:                  Expected Discharge Plan:  Home w Hospice Care  In-House Referral:     Discharge planning Services  CM Consult  Post Acute Care Choice:  Hospice Choice offered to:  Patient  DME Arranged:    DME Agency:     HH Arranged:    [place] Agency:     Status of Service:  In process, will continue to follow  Medicare Important Message Given:    Date Medicare IM Given:    Medicare IM give by:    Date Additional Medicare IM Given:    Additional Medicare Important Message give by:     If discussed at [place] of Stay Meetings, dates discussed:    Additional Comments:  Dessa Phi, RN 07/12/2015, 11:25 AM

## 2015-07-26 ENCOUNTER — Ambulatory Visit: Payer: Medicare Other | Admitting: Oncology

## 2015-08-22 ENCOUNTER — Other Ambulatory Visit: Payer: Self-pay | Admitting: Oncology

## 2015-08-23 ENCOUNTER — Other Ambulatory Visit: Payer: Self-pay | Admitting: *Deleted

## 2015-08-23 MED ORDER — LORAZEPAM 0.5 MG PO TABS
0.5000 mg | ORAL_TABLET | Freq: Four times a day (QID) | ORAL | Status: AC | PRN
Start: 2015-08-23 — End: ?

## 2015-08-23 NOTE — Telephone Encounter (Signed)
Per request from [place], [facility].  "pt confused, she is progressing towards end of life; not sleeping, does not have any more Ativan and would Dr. Benay Spice re-fill Ativan?"  Yolanda Friedman also reports that she is taking in "just bites of food and some fluids; has fallen; very confused; family hesitant and asking about IVF"  Per Dr. Benay Spice; called in Ativan re-fill to pharmacy and left message with Yolanda Friedman re: refill.

## 2015-08-25 ENCOUNTER — Emergency Department (HOSPITAL_COMMUNITY)
Admission: EM | Admit: 2015-08-25 | Discharge: 2015-08-25 | Disposition: A | Discharge status: Home / Self Care | Attending: Emergency Medicine | Admitting: Emergency Medicine

## 2015-08-25 ENCOUNTER — Emergency Department (HOSPITAL_COMMUNITY)

## 2015-08-25 ENCOUNTER — Encounter (HOSPITAL_COMMUNITY): Payer: Self-pay | Admitting: *Deleted

## 2015-08-25 DIAGNOSIS — K219 Gastro-esophageal reflux disease without esophagitis: Secondary | ICD-10-CM | POA: Diagnosis not present

## 2015-08-25 DIAGNOSIS — K7200 Acute and subacute hepatic failure without coma: Secondary | ICD-10-CM | POA: Insufficient documentation

## 2015-08-25 DIAGNOSIS — E876 Hypokalemia: Secondary | ICD-10-CM | POA: Insufficient documentation

## 2015-08-25 DIAGNOSIS — R4182 Altered mental status, unspecified: Secondary | ICD-10-CM | POA: Diagnosis not present

## 2015-08-25 DIAGNOSIS — Z79899 Other long term (current) drug therapy: Secondary | ICD-10-CM | POA: Insufficient documentation

## 2015-08-25 DIAGNOSIS — I10 Essential (primary) hypertension: Secondary | ICD-10-CM | POA: Insufficient documentation

## 2015-08-25 DIAGNOSIS — E119 Type 2 diabetes mellitus without complications: Secondary | ICD-10-CM | POA: Diagnosis not present

## 2015-08-25 DIAGNOSIS — F419 Anxiety disorder, unspecified: Secondary | ICD-10-CM | POA: Insufficient documentation

## 2015-08-25 DIAGNOSIS — Z85038 Personal history of other malignant neoplasm of large intestine: Secondary | ICD-10-CM | POA: Diagnosis not present

## 2015-08-25 DIAGNOSIS — Z79891 Long term (current) use of opiate analgesic: Secondary | ICD-10-CM | POA: Diagnosis not present

## 2015-08-25 DIAGNOSIS — R000 Tachycardia, unspecified: Secondary | ICD-10-CM | POA: Diagnosis not present

## 2015-08-25 LAB — CBC
HCT: 26.2 % — ABNORMAL LOW (ref 36.0–46.0)
Hemoglobin: 8.6 g/dL — ABNORMAL LOW (ref 12.0–15.0)
MCH: 28.8 pg (ref 26.0–34.0)
MCHC: 32.8 g/dL (ref 30.0–36.0)
MCV: 87.6 fL (ref 78.0–100.0)
PLATELETS: 184 10*3/uL (ref 150–400)
RBC: 2.99 MIL/uL — ABNORMAL LOW (ref 3.87–5.11)
RDW: 17.6 % — AB (ref 11.5–15.5)
WBC: 12.2 10*3/uL — AB (ref 4.0–10.5)

## 2015-08-25 LAB — I-STAT CG4 LACTIC ACID, ED: LACTIC ACID, VENOUS: 4.26 mmol/L — AB (ref 0.5–2.0)

## 2015-08-25 LAB — COMPREHENSIVE METABOLIC PANEL
ALK PHOS: 206 U/L — AB (ref 38–126)
ALT: 81 U/L — AB (ref 14–54)
AST: 156 U/L — AB (ref 15–41)
Albumin: 1.9 g/dL — ABNORMAL LOW (ref 3.5–5.0)
Anion gap: 20 — ABNORMAL HIGH (ref 5–15)
BILIRUBIN TOTAL: 8 mg/dL — AB (ref 0.3–1.2)
BUN: 9 mg/dL (ref 6–20)
CALCIUM: 7.8 mg/dL — AB (ref 8.9–10.3)
CO2: 25 mmol/L (ref 22–32)
CREATININE: 0.6 mg/dL (ref 0.44–1.00)
Chloride: 90 mmol/L — ABNORMAL LOW (ref 101–111)
GFR calc Af Amer: >60 mL/min (ref >60)
Glucose, Bld: 126 mg/dL — ABNORMAL HIGH (ref 65–99)
Potassium: 2.5 mmol/L — CL (ref 3.5–5.1)
Sodium: 135 mmol/L (ref 135–145)
TOTAL PROTEIN: 5.6 g/dL — AB (ref 6.5–8.1)

## 2015-08-25 LAB — TROPONIN I: Troponin I: 0.03 ng/mL (ref <0.031)

## 2015-08-25 LAB — AMMONIA: AMMONIA: 70 umol/L — AB (ref 9–35)

## 2015-08-25 LAB — CBG MONITORING, ED: GLUCOSE-CAPILLARY: 120 mg/dL — AB (ref 65–99)

## 2015-08-25 MED ORDER — POTASSIUM CHLORIDE 10 MEQ/100ML IV SOLN
10.0000 meq | Freq: Once | INTRAVENOUS | Status: AC
  Administered 2015-08-25: 10 meq via INTRAVENOUS
  Filled 2015-08-25: qty 100

## 2015-08-25 MED ORDER — LORAZEPAM 2 MG/ML IJ SOLN
1.0000 mg | Freq: Once | INTRAMUSCULAR | Status: AC
  Administered 2015-08-25: 1 mg via INTRAVENOUS
  Filled 2015-08-25: qty 1

## 2015-08-25 MED ORDER — SODIUM CHLORIDE 0.9 % IV BOLUS (SEPSIS)
1000.0000 mL | Freq: Once | INTRAVENOUS | Status: AC
  Administered 2015-08-25: 1000 mL via INTRAVENOUS

## 2015-08-25 MED ORDER — HEPARIN SOD (PORK) LOCK FLUSH 100 UNIT/ML IV SOLN
500.0000 [IU] | Freq: Once | INTRAVENOUS | Status: AC
Start: 2015-08-25 — End: 2015-08-25
  Administered 2015-08-25: 500 [IU]
  Filled 2015-08-25: qty 5

## 2015-08-25 NOTE — ED Provider Notes (Signed)
CSN: [id number]     Arrival date & time 08/25/15  1749 History   First MD Initiated Contact with Patient 08/25/15 0710     Chief Complaint  Patient presents with  . Altered Mental Status     (Consider location/radiation/quality/duration/timing/severity/associated sxs/prior Treatment) HPI Comments: The patient has a 64 year old female, she has known stage IV colon cancer and is currently on hospice care, and has been getting more confused lately - she has been having more and more confusion and disorientation, she has been going to the bathroom frequently, hospice care came into the house yesterday and placed a Foley catheter, the family member believes that the patient pulled this out however according to nursing notes from prior to change of shift the catheter was in place on their arrival. The patient is unable to tell me any information regarding her current condition, she answers questions with yes as her nose but does not give any detail, the family member reports that there has been no vomiting, coughing, fevers, rashes or swelling. The patient has not been eating very much and is essentially getting pain medicine and nausea medicine at home, eating very little.  Patient is a 64 y.o. female presenting with altered mental status. The history is provided by the patient, a relative and medical records.  Altered Mental Status  Per recent Oncology notes:  1. Stage IV colon cancer, K-ras mutation detected  Initial diagnosis 2005  Recurrent disease April 2012, status post a hysterectomy, bilateral salpingo-oophorectomy, omentectomy, and radical debulking by Dr. 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 Hypertension   . Cancer ([place])   . Colon cancer [facility]) 2005    metastatic 2012  . 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 Diabetes mellitus   . HTN (hypertension) 12/22/2011  . Shortness of breath   . GERD (gastroesophageal reflux disease)   . Anxiety   . Normocytic normochromic anemia 06/26/2013   Past Surgical History  Procedure Laterality Date  . Cholecystectomy  1987  . Colectomy  2005    colon ca  . Abdominal hysterectomy  12/20/2010    met colon ca  . Portacath placement  01/2011  . Cataract extraction w/phaco  08/08/2012    Procedure: CATARACT EXTRACTION PHACO AND INTRAOCULAR LENS PLACEMENT (IOC);  Surgeon: Tonny Branch, MD;  Location: [facility];  Service: Ophthalmology;  Laterality: Right;  CDE:16.18  . Cataract extraction w/phaco  08/26/2012    Procedure: CATARACT EXTRACTION PHACO AND INTRAOCULAR LENS PLACEMENT (IOC);  Surgeon: Tonny Branch, MD;  Location: [facility];  Service: Ophthalmology;  Laterality: Left;  CDE 7.64   Family History  Problem Relation Age of Onset  . Cancer Mother   . Hypertension Sister   . Diabetes Sister   . Hypertension Maternal Uncle    Social History  Substance Use Topics  . Smoking status: Never Smoker   . Smokeless tobacco: Never Used  . Alcohol Use: Yes     Comment: occassional   OB History    No data available     Review of Systems  All other systems reviewed and are negative.  Allergies  Review of patient's allergies indicates no known allergies.  Home Medications   Prior to Admission medications   Medication Sig Start Date End Date Taking? Authorizing Provider  albuterol (PROVENTIL HFA;VENTOLIN HFA) 108 (90 BASE) MCG/ACT inhaler Inhale 2 puffs into the lungs every 6 (six) hours as needed for wheezing. 05/19/14   Baird Cancer, PA-C  feeding supplement, ENSURE ENLIVE, (ENSURE ENLIVE) LIQD Take 237 mLs by mouth 2 (two) times daily between meals. 07/12/15   Florencia Reasons, MD  LORazepam (ATIVAN) 0.5 MG tablet Place 1 tablet (0.5 mg total) under the tongue every 6 (six) hours as needed for anxiety or sleep. 08/23/15   Ladell Pier, MD  morphine (MS CONTIN) 30 MG 12 hr tablet Take 1 tablet (30 mg total) by mouth daily. 07/12/15   Florencia Reasons, MD  Multiple Vitamins-Minerals (ONE-A-DAY 50 PLUS PO) Take 1 tablet by mouth daily.     Historical Provider, MD  omeprazole (PRILOSEC) 20 MG capsule Take 1 capsule (20 mg total) by mouth 2 (two) times daily. 10/02/14   Ladell Pier, MD  ondansetron (ZOFRAN) 8 MG tablet Take 1 tablet (8 mg total) by mouth 2 (two) times daily as needed for nausea (vomiting). Patient not taking: Reported on 06/14/2015 03/29/15   Ladell Pier, MD  Oxycodone HCl 10 MG TABS Take 1 tablet (10 mg total) by mouth every 4 (four) hours as needed (for pain). 07/12/15   Florencia Reasons, MD  polyethylene glycol (MIRALAX / Floria Raveling) packet Take 17 g by mouth daily.    Historical Provider, MD  potassium chloride (K-DUR) 10 MEQ tablet Take 1 tablet (10 mEq total) by mouth daily. Patient not taking: Reported on 06/14/2015 08/19/14   Ladell Pier, MD  prochlorperazine (COMPAZINE) 10 MG tablet TAKE 1 TABLET BY MOUTH EVERY 6 HOURS AS NEEDED FOR NAUSEA AND VOMITING Patient not taking: Reported on 06/14/2015 04/26/15   Ladell Pier, MD  temazepam (RESTORIL) 15 MG capsule Take 1-2 capsules by mouth @ bedtime as needed to help you sleep. 03/29/15   Ladell Pier, MD   BP 107/72 mmHg  Pulse 108  Temp(Src) 97.5 F (36.4 C) (Oral)  Resp 14  SpO2 97% Physical Exam  Constitutional: She appears well-developed and well-nourished. She appears distressed.  HENT:  Head: Normocephalic and atraumatic.  Mouth/Throat: Oropharynx is clear and moist. No oropharyngeal exudate.  Eyes: EOM are normal. Pupils are equal, round, and reactive  to light. Right eye exhibits no discharge. Left eye exhibits no discharge. No scleral icterus.  Jaundice present, mucous membranes are pale  Neck: Normal range of motion. Neck supple. No JVD present. No thyromegaly present.  Cardiovascular: Regular rhythm, normal heart sounds and intact distal pulses.  Exam reveals no gallop and no friction rub.   No murmur heard. Tachycardic to 125 bpm weak peripheral pulses, no peripheral edema or JVD  Pulmonary/Chest: Effort normal and breath sounds normal. No respiratory distress. She has no wheezes. She has no rales.  Abdominal: Soft. Bowel sounds are normal. She exhibits mass. She exhibits no distension. There is tenderness.  Lower abdominal tenderness with large lower abdominal mass  Musculoskeletal: Normal range of motion. She exhibits no edema or tenderness.  Lymphadenopathy:    She has no cervical adenopathy.  Neurological: She is alert. Coordination normal.  The patient is alert, she follows commands, she appears generally weak  Skin: Skin is warm and dry. No rash noted. No erythema.  Psychiatric: She has a normal mood and  affect. Her behavior is normal.  Nursing note and vitals reviewed.   ED Course  Procedures (including critical care time) Labs Review Labs Reviewed  COMPREHENSIVE METABOLIC PANEL - Abnormal; Notable for the following:    Potassium 2.5 (*)    Chloride 90 (*)    Glucose, Bld 126 (*)    Calcium 7.8 (*)    Total Protein 5.6 (*)    Albumin 1.9 (*)    AST 156 (*)    ALT 81 (*)    Alkaline Phosphatase 206 (*)    Total Bilirubin 8.0 (*)    Anion gap 20 (*)    All other components within normal limits  CBC - Abnormal; Notable for the following:    WBC 12.2 (*)    RBC 2.99 (*)    Hemoglobin 8.6 (*)    HCT 26.2 (*)    RDW 17.6 (*)    All other components within normal limits  AMMONIA - Abnormal; Notable for the following:    Ammonia 70 (*)    All other components within normal limits  CBG MONITORING, ED - Abnormal;  Notable for the following:    Glucose-Capillary 120 (*)    All other components within normal limits  I-STAT CG4 LACTIC ACID, ED - Abnormal; Notable for the following:    Lactic Acid, Venous 4.26 (*)    All other components within normal limits  URINE CULTURE  TROPONIN I  URINALYSIS, ROUTINE W REFLEX MICROSCOPIC (NOT AT [facility])  CBG MONITORING, ED    Imaging Review Dg Chest Port 1 View  08/25/2015  CLINICAL DATA:  Worsening mental status changes over the last several days. Recently diagnosed with UTI. History of colon cancer. EXAM: PORTABLE CHEST 1 VIEW COMPARISON:  Radiographs 06/26/2013.  Chest CT 09/18/2013. FINDINGS: 0738 hours. Right IJ Port-A-Cath tip at the SVC right atrial junction. The heart size and mediastinal contours are stable. There are lower lung volumes without resulting confluent airspace opacity, pleural effusion or pneumothorax. Skin fold overlies the right lung apex. No evidence of pneumothorax. Degenerative changes throughout the spine are stable. IMPRESSION: Lower lung volumes without definite acute cardiopulmonary process. Electronically Signed   By: Richardean Sale M.D.   On: 08/25/2015 07:57   I have personally reviewed and evaluated these images and lab results as part of my medical decision-making.   EKG Interpretation   Date/Time:  Wednesday August 25 2015 07:56:40 EST Ventricular Rate:  125 PR Interval:  121 QRS Duration: 73 QT Interval:  326 QTC Calculation: 470 R Axis:   91 Text Interpretation:  Sinus tachycardia Ventricular premature complex  Aberrant complex Right axis deviation Nonspecific repol abnormality,  diffuse leads diffuse ST abnormalities Abnormal ekg Confirmed by Sabra Heck   MD, Omari Mcmanaway ([id number]) on 08/25/2015 8:00:33 AM      MDM   Final diagnoses:  Altered mental status, unspecified altered mental status type  Hypokalemia  Acute liver failure without hepatic coma    Ill-appearing geriatric female with known history of metastatic colon  cancer, she is tachycardic, ill-appearing, suspect underlying infection severe dehydration or renal failure, she also appears to have liver failure based on her jaundice, labs pending, IV fluids, there is no urine in the Foley catheter. Check CBG.   The patient has multisystem organ failure, she has worsening liver dysfunction, I suspect this is from a mass pushing on the biliary system. She has severe hypokalemia, she is persistently tachycardic, borderline hypotensive, she is severely ill, I have met with her hospice nurse as  well as her healthcare power of attorney and her family members, everybody is in agreement that they want comfort care for her, they want her to go home, hospice will arrange for outpatient facilitation of hospice placement. The patient will go home with Ativan which she has a prescription for, she has pain medication, she has been given IV fluids for hydration, I suspect that she has 24-48 hours as she is acutely decompensating, the family members are all in agreement with this plan.  Noemi Chapel, MD 08/25/15 [phone]

## 2015-08-25 NOTE — ED Notes (Signed)
RN Octavia Bruckner going to try to draw lab off line

## 2015-08-25 NOTE — ED Notes (Signed)
Her port is de-accessed and she is d/c'd. With PTAR at this time.  Her son is here also and is going to follow ambulance (or precede it) to unlock the house for entry.  Pt. Unable to comprehend or sign anything.  She is drowsy and in no distress.

## 2015-08-25 NOTE — ED Notes (Signed)
MD miller aware of lactic value

## 2015-08-25 NOTE — ED Notes (Signed)
Bed: WA15 Expected date:  Expected time:  Means of arrival:  Comments: EMS  

## 2015-08-25 NOTE — Progress Notes (Signed)
[facility] ED Room 15-[facility]- RN Visit  Patient seen in ED, with daugther Marquitta at bedside.  Patient not responsive to verbal stimuli.  Participated in family meeting, with ED MD, Dr. Sabra Heck, Lonia Blood on speaker phone and Marquitta.  Family denied wanting any agressive treatrment.  MD explained that her cancer has seemed to metastisize and she may be close to EOL.  Marquitta tearful during conversation and offered emotional support.  MD also stated her K was 2.5 and that could be treated for comfort prior to returning home.  Family agreed to treating potassium and IV fluids. Family agreed that they do not want to admit her to the hospital and want to send her home and use the Ativan that was recently prescribed to help with her agitation and restlessness.  Marquitta stated she brought her to the ED, because her F/C had been accidently pulled when patient got up early morning out of confusion.  Family wants to keep F/C in place upon discharge, as they feel this will help her. Morey Hummingbird mentioned there are young children in the home, and they do not want the patient to die in the home.  Family stated they are familiar with [facility] and would like to consider transfer to the facility if this is an option as she approaches closer to EOL.  [facility] social worker, Benedict Needy made aware.  [facility] RN, Meagan updated and will follow up when patient returns home.    Please call with any questions.  Thank You, Freddi Starr RN, [facility] Liaison [phone]

## 2015-08-25 NOTE — ED Notes (Signed)
RN to draw labs and access port at bedside

## 2015-08-25 NOTE — ED Notes (Signed)
Hospice nurse in room.

## 2015-08-25 NOTE — ED Notes (Signed)
Per pt's family - pt w/ progressively worse confusion since 08/10/15 - pt dx and treated for UTI and currently has a foley catheter in place which was placed yesterday and pt currently on cipro. Pt alert to person only. Pt w/ hx of colon ca and currently under hospice care.

## 2015-08-27 ENCOUNTER — Telehealth: Payer: Self-pay | Admitting: *Deleted

## 2015-08-27 NOTE — Telephone Encounter (Signed)
Received notification via fax from [place] --pt passed away 2015-09-08.  Dr. Benay Spice made aware.

## 2015-09-05 DEATH — deceased
